# Patient Record
Sex: Male | Born: 1955
Health system: Southern US, Community
[De-identification: ages and names within clinical notes are randomized; demographics above are authoritative.]

## PROBLEM LIST (undated history)

## (undated) DIAGNOSIS — J302 Other seasonal allergic rhinitis: Secondary | ICD-10-CM

## (undated) DIAGNOSIS — T7840XA Allergy, unspecified, initial encounter: Secondary | ICD-10-CM

## (undated) DIAGNOSIS — C801 Malignant (primary) neoplasm, unspecified: Secondary | ICD-10-CM

## (undated) DIAGNOSIS — K219 Gastro-esophageal reflux disease without esophagitis: Secondary | ICD-10-CM

## (undated) DIAGNOSIS — E785 Hyperlipidemia, unspecified: Secondary | ICD-10-CM

## (undated) DIAGNOSIS — C61 Malignant neoplasm of prostate: Secondary | ICD-10-CM

## (undated) DIAGNOSIS — I10 Essential (primary) hypertension: Secondary | ICD-10-CM

## (undated) DIAGNOSIS — G709 Myoneural disorder, unspecified: Secondary | ICD-10-CM

## (undated) DIAGNOSIS — G629 Polyneuropathy, unspecified: Secondary | ICD-10-CM

## (undated) DIAGNOSIS — M1990 Unspecified osteoarthritis, unspecified site: Secondary | ICD-10-CM

## (undated) HISTORY — PX: JOINT REPLACEMENT: SHX530

## (undated) HISTORY — DX: Gastro-esophageal reflux disease without esophagitis: K21.9

## (undated) HISTORY — DX: Unspecified osteoarthritis, unspecified site: M19.90

## (undated) HISTORY — DX: Myoneural disorder, unspecified: G70.9

## (undated) HISTORY — DX: Other seasonal allergic rhinitis: J30.2

## (undated) HISTORY — DX: Malignant (primary) neoplasm, unspecified: C80.1

## (undated) HISTORY — DX: Allergy, unspecified, initial encounter: T78.40XA

## (undated) HISTORY — DX: Essential (primary) hypertension: I10

## (undated) HISTORY — DX: Polyneuropathy, unspecified: G62.9

## (undated) HISTORY — DX: Malignant neoplasm of prostate: C61

## (undated) HISTORY — DX: Hyperlipidemia, unspecified: E78.5

---
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
## 2006-09-02 DIAGNOSIS — C801 Malignant (primary) neoplasm, unspecified: Secondary | ICD-10-CM

## 2006-09-02 HISTORY — DX: Malignant (primary) neoplasm, unspecified: C80.1

## 2006-12-22 ENCOUNTER — Inpatient Hospital Stay (HOSPITAL_COMMUNITY): Admission: RE | Admit: 2006-12-22 | Discharge: 2006-12-23 | Payer: Self-pay | Admitting: Urology

## 2006-12-22 ENCOUNTER — Encounter (INDEPENDENT_AMBULATORY_CARE_PROVIDER_SITE_OTHER): Payer: Self-pay | Admitting: Specialist

## 2007-02-04 ENCOUNTER — Ambulatory Visit (HOSPITAL_COMMUNITY): Admission: RE | Admit: 2007-02-04 | Discharge: 2007-02-04 | Payer: Self-pay | Admitting: Urology

## 2008-09-02 HISTORY — PX: REPLACEMENT TOTAL KNEE: SUR1224

## 2009-06-29 ENCOUNTER — Encounter: Admission: RE | Admit: 2009-06-29 | Discharge: 2009-06-29 | Payer: Self-pay | Admitting: Orthopedic Surgery

## 2009-08-02 HISTORY — PX: JOINT REPLACEMENT: SHX530

## 2009-08-16 ENCOUNTER — Inpatient Hospital Stay (HOSPITAL_COMMUNITY): Admission: RE | Admit: 2009-08-16 | Discharge: 2009-08-18 | Payer: Self-pay | Admitting: Orthopedic Surgery

## 2010-09-02 HISTORY — PX: COLONOSCOPY: SHX174

## 2010-12-03 LAB — CBC
HCT: 39.2 % (ref 39.0–52.0)
MCV: 97.8 fL (ref 78.0–100.0)
Platelets: 199 10*3/uL (ref 150–400)
Platelets: 215 10*3/uL (ref 150–400)
RDW: 11.9 % (ref 11.5–15.5)
WBC: 12.1 10*3/uL — ABNORMAL HIGH (ref 4.0–10.5)

## 2010-12-03 LAB — BASIC METABOLIC PANEL
Chloride: 96 mEq/L (ref 96–112)
Creatinine, Ser: 0.67 mg/dL (ref 0.4–1.5)
GFR calc Af Amer: >60 mL/min (ref >60)
GFR calc non Af Amer: >60 mL/min (ref >60)
Sodium: 132 mEq/L — ABNORMAL LOW (ref 135–145)

## 2010-12-03 LAB — PROTIME-INR
INR: 1.21 (ref 0.00–1.49)
Prothrombin Time: 13.8 seconds (ref 11.6–15.2)
Prothrombin Time: 15.2 seconds (ref 11.6–15.2)

## 2010-12-04 LAB — DIFFERENTIAL
Basophils Relative: 0 % (ref 0–1)
Eosinophils Relative: 2 % (ref 0–5)
Lymphocytes Relative: 23 % (ref 12–46)
Lymphs Abs: 1.5 10*3/uL (ref 0.7–4.0)
Monocytes Absolute: 0.8 10*3/uL (ref 0.1–1.0)
Monocytes Relative: 11 % (ref 3–12)
Neutrophils Relative %: 64 % (ref 43–77)

## 2010-12-04 LAB — COMPREHENSIVE METABOLIC PANEL
ALT: 59 U/L — ABNORMAL HIGH (ref 0–53)
AST: 46 U/L — ABNORMAL HIGH (ref 0–37)
Alkaline Phosphatase: 45 U/L (ref 39–117)
Creatinine, Ser: 0.82 mg/dL (ref 0.4–1.5)
GFR calc Af Amer: >60 mL/min (ref >60)
Glucose, Bld: 120 mg/dL — ABNORMAL HIGH (ref 70–99)
Potassium: 4 mEq/L (ref 3.5–5.1)
Sodium: 137 mEq/L (ref 135–145)
Total Protein: 7 g/dL (ref 6.0–8.3)

## 2010-12-04 LAB — CBC
MCHC: 33.9 g/dL (ref 30.0–36.0)
MCV: 99 fL (ref 78.0–100.0)
Platelets: 223 10*3/uL (ref 150–400)
RDW: 12.4 % (ref 11.5–15.5)

## 2010-12-04 LAB — TYPE AND SCREEN: Antibody Screen: NEGATIVE

## 2010-12-04 LAB — URINALYSIS, ROUTINE W REFLEX MICROSCOPIC
Glucose, UA: NEGATIVE mg/dL
Protein, ur: NEGATIVE mg/dL
Specific Gravity, Urine: 1.024 (ref 1.005–1.030)
Urobilinogen, UA: 0.2 mg/dL (ref 0.0–1.0)

## 2011-01-18 NOTE — Discharge Summary (Signed)
NAME:  Travis Ellis, Travis Ellis NO.:  [id number]   MEDICAL RECORD NO.:  [id number]          PATIENT TYPE:  INP   LOCATION:  1436                         FACILITY:  [facility]   PHYSICIAN:  Heloise Purpura, MD      DATE OF BIRTH:  09/27/1955   DATE OF ADMISSION:  12/22/2006  DATE OF DISCHARGE:                               DISCHARGE SUMMARY   ADMISSION DIAGNOSIS:  Prostate cancer.   DISCHARGE DIAGNOSIS:  Prostate cancer.   PROCEDURES:  1. Robotic-assisted laparoscopic radical prostatectomy.  2. Bilateral pelvic lymphadenectomy.   HISTORY:  For full details, please see admission history and physical.  Briefly, Mr. Eland is a 55 year old gentleman with clinical stage II-B  prostate cancer with a PSA of 3.05 and a Gleason score of 3+3=7.  He did  undergo a CT scan, which demonstrated no evidence of metastatic disease.  After discussing options, he elected to proceed with surgical removal of  his prostate in a robotic-assisted laparoscopic fashion.   HOSPITAL COURSE:  The patient was taken to the operating room on December 22, 2006, and underwent a robotic-assisted laparoscopic radical  prostatectomy and bilateral pelvic lymphadenectomy.  He tolerated the  procedure well and without complications.  Postoperatively, he was able  to be transferred to a regular hospital room following recovery from  anesthesia.  He was able to begin ambulating the night of surgery and by  postoperative day #1 was ambulating without difficulty.  He was begun on  a clear liquid diet, which he tolerated without trouble.  His hemoglobin  remained stable at 12.9 on postoperative day #1.  He maintained  excellent urine output from his Foley catheter with minimal output from  his pelvic drain.  His pelvic drain was therefore removed.  By the  afternoon of postoperative day #1, he had met all discharge criteria and  was able to be discharged home in excellent condition.   DISPOSITION:  Home.   DISCHARGE  MEDICATIONS:  The patient was instructed to resume his regular  home medications excepting any aspirin, nonsteroidal anti-inflammatory  drugs, or herbal supplements.  Specifically, he was told that he may  resume his Prilosec, triamterine/hydrochlorothiazide, simvastatin,  Norvasc, and Nasacort.  He was also given a prescription to take  Darvocet as needed for pain, Colace as a stool softener.  He was also  given Cipro to take beginning one day prior to his return visit.   DISCHARGE INSTRUCTIONS:  The patient was instructed to be ambulatory but  specifically told to refrain from any heavy lifting, strenuous activity  or driving.  He was instructed on routine Foley catheter care.  He was  also told to gradually advance his diet once passing flatus.   FOLLOW-UP:  The patient will follow up in one week for removal of the  Foley catheter and to discuss his surgical pathology in detail.           ______________________________  Heloise Purpura, MD  Electronically Signed     LB/MEDQ  D:  12/23/2006  T:  12/23/2006  Job:  [id number]

## 2011-01-18 NOTE — H&P (Signed)
NAME:  JAY, HASKEW NO.:  [id number]   MEDICAL RECORD NO.:  [id number]          PATIENT TYPE:  INP   LOCATION:  X003                         FACILITY:  [facility]   PHYSICIAN:  Heloise Purpura, MD      DATE OF BIRTH:  1956/07/17   DATE OF ADMISSION:  12/22/2006  DATE OF DISCHARGE:                              HISTORY & PHYSICAL   CHIEF COMPLAINT:  Prostate cancer.   HISTORY OF PRESENT ILLNESS:  Mr. Gustafson is a 55 year old gentleman with  clinical stage T2B prostate cancer with a PSA of 3.05 and Gleason score  of  3+4=7.  He was found to have a nodule along the right base and right  mid portion of the prostate and therefore underwent a prostate biopsy on  October 09, 2006.  This demonstrated 3 out of 13 biopsy cores to be  positive; all on the right side.  He has undergone a CT scan that was  negative for metastatic disease.  He was subsequently seen in  consultation and after discussing management options for clinically  localized prostate cancer, he has elected to proceed with surgical  therapy.  We did discuss our intraoperative plan and due to the  patient's significant palpable disease on the right side of the  prostate, he elected to proceed with a unilateral nerve-sparing  procedure.   PAST MEDICAL HISTORY:  1. Hyperlipidemia.  2. Hypertension.  3. Gastroesophageal reflux disease.   PAST SURGICAL HISTORY:  1. 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  2. Denies any abdominal surgeries.   MEDICATIONS:  1. Norvasc.  2. Triamterene hydrochlorothiazide.  3. Lipitor.  4. Nasacort.  5. Niacin.  6. Aspirin.  7. Multivitamin.  8. Fish oil.   ALLERGIES:  NO KNOWN DRUG ALLERGIES.   FAMILY HISTORY:  No history of prostate cancer or GU malignancy.   SOCIAL HISTORY:  The patient is a Naval architect.  He is married.  He does  smoke infrequently.  He also drinks alcohol infrequently.   REVIEW OF SYSTEMS:  Pertinent positives include chronic  back pain.  All  other systems are reviewed and are negative.   PHYSICAL EXAMINATION:  CONSTITUTIONAL:  Alert and oriented,  in no acute  distress.  CARDIOVASCULAR:  Regular rate and rhythm without obvious murmurs.  LUNGS:  Clear bilaterally.  ABDOMEN:  Soft, nontender, nondistended without abdominal masses.  DRE:  There is firmness along the entire right base and right mid  portion of the prostate consistent with a T2B prostate cancer.   IMPRESSION:  Clinically localized prostate cancer.   PLAN:  Mr. Marxen will undergo a robotic-assisted laparoscopic radical  prostatectomy and then be admitted to the hospital for routine  postoperative care.  He will also undergo a bilateral pelvic  lymphadenectomy.           ______________________________  Heloise Purpura, MD  Electronically Signed     LB/MEDQ  D:  12/22/2006  T:  12/22/2006  Job:  [id number]

## 2011-01-18 NOTE — Op Note (Signed)
NAME:  Travis Ellis, Travis Ellis NO.:  [id number]   MEDICAL RECORD NO.:  [id number]          PATIENT TYPE:  INP   LOCATION:  X003                         FACILITY:  [facility]   PHYSICIAN:  Heloise Purpura, MD      DATE OF BIRTH:  01/08/1956   DATE OF PROCEDURE:  12/22/2006  DATE OF DISCHARGE:                               OPERATIVE REPORT   PREOPERATIVE DIAGNOSIS:  Clinically localized adenocarcinoma of  prostate.   POSTOPERATIVE DIAGNOSIS:  Clinically localized adenocarcinoma of  prostate.   PROCEDURES:  1. Robotic assisted laparoscopic radical prostatectomy (left nerve      sparing).  2. Bilateral pelvic lymphadenectomy.   SURGEON:  Dr. Heloise Purpura.   ASSISTANT:  Dr. Garrison Columbus.   ANESTHESIA:  General.   COMPLICATIONS:  None.   ESTIMATED BLOOD LOSS:  250 mL.   INTRAVENOUS FLUIDS:  1900 mL of lactated Ringer's.   SPECIMENS:  1. Prostate and seminal vesicles.  2. Right pelvic lymph nodes.  3. Left pelvic lymph nodes.   DISPOSITION OF A PATHOLOGY SPECIMEN:  To pathology.   DRAINS:  1. A 20-French straight catheter.  2. A #19 Blake pelvic drain.   INDICATION:  Travis Ellis is a 55 year old gentleman with clinical stage  T2b prostate cancer with a PSA of 3.05 and Gleason score of 3 + 4 =  seven.  The patient had minimal voiding symptoms and good erectile  function preoperatively.  He did undergo a CT scan which had  demonstrated no evidence of metastatic disease.  After discussing  management options for clinically localized prostate cancer, the patient  elected to proceed with the above procedures.  Potential risks and  benefits were discussed with the patient and he consented.   DESCRIPTION OF PROCEDURE:  The patient was taken to the operating room  and a general anesthetic was administered.  He was given preoperative  antibiotics, placed in the dorsal lithotomy position, prepped and draped  in the usual sterile fashion.  A preoperative time-out was  performed.  A  Foley catheter was inserted into the bladder.  A site was selected just  to the left of the umbilicus for placement of the camera port.  This was  placed using a standard open Hassan technique.  This allowed entry into  the peritoneal cavity under direct vision and without difficulty.  A 12  mm port was then placed and the pneumoperitoneum was established.  A 0  degree lens was then used to inspect the abdomen and there was no  evidence of any intra-abdominal injuries or other abnormalities.  Attention then turned to placement of the remaining ports.  Bilateral 8  mm robotic ports were placed lateral and just inferior to the camera  port.  An additional 8 mm robotic port was placed in the far left  lateral abdominal wall.  A 5 mm port was placed between the camera port  and the right robotic port.  An additional 12 mm port was placed in the  far right lateral abdominal wall for laparoscopic assistance.  All ports  were placed under direct vision  and without difficulty.  The surgical  cart was then docked.  With the aid of the cautery scissors, the bladder  was reflected posteriorly allowing entry into the space of Retzius and  identification of the endopelvic fascia and prostate.  The endopelvic  fascia was then incised from the apex back to the base of the prostate  bilaterally.  The underlying levator muscle fibers were swept laterally  off the prostate thereby isolating the dorsal venous complex.  The  dorsal venous complex was then stapled and divided with a 45 mm flex ETS  stapler.  The bladder neck was identified with the aid of Foley catheter  manipulation and entered anteriorly.  A Foley catheter balloon was  deflated and the catheter was brought into the operative field and used  to retract the prostate anteriorly.  The posterior bladder neck was then  divided and the space between the bladder neck and prostate was  developed until the vasa deferentia and seminal  vesicles were  identified.  The vasa deferentia were isolated and divided and lifted  anteriorly.  The seminal vesicles were also dissected free and lifted  anteriorly after controlling the seminal vesicle arterial blood supply.  The space between the anterior rectum and Denonvilliers' fascia was then  bluntly developed thereby isolating the vascular pedicles to the  prostate.  The vascular pedicle on the right side was ligated with Hem-o-  lok clips and excised with a wide non-nerve sparing approach.  On the  left side, the lateral prostatic fascia was incised allowing the  neurovascular bundle to be swept laterally and posteriorly off the  prostate.  The vascular pedicle was then ligated with a Hem-o-lok clip  and divided with sharp cold scissor dissection.  The urethra was then  sharply divided allowing the prostate specimen to be disarticulated.  The pelvis was copiously irrigated.  There was noted to be a small  bleeding vessel off the left pedicle which was able to be clipped with a  Hem-o-lok clip.  There was also some bleeding along the neurovascular  bundle on the left side which was controlled with a figure-of-eight 0  Vicryl suture above the level of the nerve bundle.  Attention then  turned to the right pelvic sidewall.  The fibrofatty tissue between the  external iliac vein, confluence of the iliac vessels, obturator nerve,  and Cooper's ligament was dissected free from the pelvic sidewall with  Hem-o-lok clips used for hemostasis and lymphostasis.  This specimen was  then passed off for permanent pathologic analysis.  An identical  procedure was then performed on the contralateral side.  Attention then  turned to the urethral anastomosis.  A 2-0 Vicryl slip-knot was placed  at the 6 o'clock position between the bladder neck and urethra to  reapproximate these structures.  A double-armed 3-0 Monocryl suture was then used to perform a 360 degree running tension-free  anastomosis  between the bladder neck and urethra.  A 20-French coude catheter was  inserted into the bladder and irrigated.  The anastomosis appeared to be  watertight and there were no blood clots within the bladder.  A #19  Blake drain was then brought through the left robotic port and  appropriately positioned in the pelvis.  It was secured to skin with a  nylon suture.  The surgical cart was then undocked.  A 0 Vicryl stitch  was used to close the right lateral 12 mm port site with the aid of  suture passer device.  The prostate specimen was placed into the  Endopouch retrieval bag and removed via the periumbilical port site  intact.  This fascial opening was then closed with a running 0 Vicryl  suture.  All port sites were then injected with 0.25% Marcaine and  reapproximated the skin level with staples.  Prior port removal, all  port sites were removed under direct vision and hemostasis appeared  excellent.  Sterile dressings were applied.  The patient appeared to  tolerate the procedure well and was able to be transferred to the  recovery unit in satisfactory condition.          ______________________________  Heloise Purpura, MD  Electronically Signed    LB/MEDQ  D:  12/22/2006  T:  12/22/2006  Job:  [id number]

## 2011-05-16 ENCOUNTER — Encounter: Payer: Self-pay | Admitting: Internal Medicine

## 2011-05-27 ENCOUNTER — Encounter: Payer: Self-pay | Admitting: Internal Medicine

## 2011-06-20 ENCOUNTER — Ambulatory Visit (AMBULATORY_SURGERY_CENTER): Payer: BC Managed Care – PPO | Admitting: *Deleted

## 2011-06-20 VITALS — Ht 73.0 in | Wt 225.0 lb

## 2011-06-20 DIAGNOSIS — Z1211 Encounter for screening for malignant neoplasm of colon: Secondary | ICD-10-CM

## 2011-06-20 MED ORDER — PEG-KCL-NACL-NASULF-NA ASC-C 100 G PO SOLR: ORAL | Status: DC

## 2011-07-05 ENCOUNTER — Encounter: Payer: Self-pay | Admitting: Internal Medicine

## 2011-07-05 ENCOUNTER — Ambulatory Visit (AMBULATORY_SURGERY_CENTER): Payer: BC Managed Care – PPO | Admitting: Internal Medicine

## 2011-07-05 VITALS — BP 119/83 | HR 71 | Temp 97.6°F | Resp 20 | Ht 73.0 in | Wt 225.0 lb

## 2011-07-05 DIAGNOSIS — Z1211 Encounter for screening for malignant neoplasm of colon: Secondary | ICD-10-CM

## 2011-07-05 DIAGNOSIS — D126 Benign neoplasm of colon, unspecified: Secondary | ICD-10-CM

## 2011-07-05 MED ORDER — SODIUM CHLORIDE 0.9 % IV SOLN: 500.0000 mL | INTRAVENOUS | Status: DC

## 2011-07-05 NOTE — Patient Instructions (Signed)
Handouts on hemorrhoids, polyps  Discharge instructions per blue and green sheets  Repeat colonoscopy in 10 years. We will mail you a letter to re mind you of this

## 2011-07-08 ENCOUNTER — Telehealth: Payer: Self-pay | Admitting: *Deleted

## 2011-07-08 NOTE — Telephone Encounter (Signed)

## 2012-01-21 ENCOUNTER — Emergency Department (HOSPITAL_COMMUNITY)
Admission: EM | Admit: 2012-01-21 | Discharge: 2012-01-21 | Disposition: A | Discharge status: Home / Self Care | Payer: BC Managed Care – PPO | Attending: Emergency Medicine | Admitting: Emergency Medicine

## 2012-01-21 ENCOUNTER — Encounter (HOSPITAL_COMMUNITY): Payer: Self-pay | Admitting: Emergency Medicine

## 2012-01-21 DIAGNOSIS — E860 Dehydration: Secondary | ICD-10-CM | POA: Insufficient documentation

## 2012-01-21 DIAGNOSIS — Z8546 Personal history of malignant neoplasm of prostate: Secondary | ICD-10-CM | POA: Insufficient documentation

## 2012-01-21 DIAGNOSIS — X30XXXA Exposure to excessive natural heat, initial encounter: Secondary | ICD-10-CM | POA: Insufficient documentation

## 2012-01-21 DIAGNOSIS — T675XXA Heat exhaustion, unspecified, initial encounter: Secondary | ICD-10-CM | POA: Insufficient documentation

## 2012-01-21 DIAGNOSIS — R55 Syncope and collapse: Secondary | ICD-10-CM | POA: Insufficient documentation

## 2012-01-21 DIAGNOSIS — K219 Gastro-esophageal reflux disease without esophagitis: Secondary | ICD-10-CM | POA: Insufficient documentation

## 2012-01-21 DIAGNOSIS — Z79899 Other long term (current) drug therapy: Secondary | ICD-10-CM | POA: Insufficient documentation

## 2012-01-21 DIAGNOSIS — R404 Transient alteration of awareness: Secondary | ICD-10-CM | POA: Insufficient documentation

## 2012-01-21 DIAGNOSIS — I10 Essential (primary) hypertension: Secondary | ICD-10-CM | POA: Insufficient documentation

## 2012-01-21 DIAGNOSIS — M129 Arthropathy, unspecified: Secondary | ICD-10-CM | POA: Insufficient documentation

## 2012-01-21 DIAGNOSIS — E785 Hyperlipidemia, unspecified: Secondary | ICD-10-CM | POA: Insufficient documentation

## 2012-01-21 DIAGNOSIS — Z7982 Long term (current) use of aspirin: Secondary | ICD-10-CM | POA: Insufficient documentation

## 2012-01-21 DIAGNOSIS — R110 Nausea: Secondary | ICD-10-CM | POA: Insufficient documentation

## 2012-01-21 LAB — BASIC METABOLIC PANEL
BUN: 33 mg/dL — ABNORMAL HIGH (ref 6–23)
CO2: 20 mEq/L (ref 19–32)
Calcium: 9.3 mg/dL (ref 8.4–10.5)
Chloride: 101 mEq/L (ref 96–112)
Creatinine, Ser: 2 mg/dL — ABNORMAL HIGH (ref 0.50–1.35)
GFR calc Af Amer: 41 mL/min — ABNORMAL LOW (ref >90)
GFR calc non Af Amer: 35 mL/min — ABNORMAL LOW (ref >90)
Glucose, Bld: 115 mg/dL — ABNORMAL HIGH (ref 70–99)
Potassium: 3.7 mEq/L (ref 3.5–5.1)
Sodium: 137 mEq/L (ref 135–145)

## 2012-01-21 LAB — POCT I-STAT, CHEM 8
BUN: 32 mg/dL — ABNORMAL HIGH (ref 6–23)
Calcium, Ion: 1.17 mmol/L (ref 1.12–1.32)
Chloride: 107 mEq/L (ref 96–112)
Glucose, Bld: 105 mg/dL — ABNORMAL HIGH (ref 70–99)

## 2012-01-21 LAB — CBC
HCT: 48.3 % (ref 39.0–52.0)
Hemoglobin: 17.1 g/dL — ABNORMAL HIGH (ref 13.0–17.0)
MCH: 33.5 pg (ref 26.0–34.0)
MCHC: 35.4 g/dL (ref 30.0–36.0)
MCV: 94.5 fL (ref 78.0–100.0)
RDW: 11.8 % (ref 11.5–15.5)

## 2012-01-21 LAB — DIFFERENTIAL
Basophils Absolute: 0 10*3/uL (ref 0.0–0.1)
Basophils Relative: 0 % (ref 0–1)
Eosinophils Absolute: 0 10*3/uL (ref 0.0–0.7)
Eosinophils Relative: 0 % (ref 0–5)
Monocytes Absolute: 1.3 10*3/uL — ABNORMAL HIGH (ref 0.1–1.0)
Monocytes Relative: 7 % (ref 3–12)
Neutro Abs: 16.1 10*3/uL — ABNORMAL HIGH (ref 1.7–7.7)

## 2012-01-21 LAB — URINALYSIS, ROUTINE W REFLEX MICROSCOPIC
Bilirubin Urine: NEGATIVE
Glucose, UA: NEGATIVE mg/dL
Ketones, ur: NEGATIVE mg/dL
Leukocytes, UA: NEGATIVE
Nitrite: NEGATIVE
Protein, ur: 30 mg/dL — AB
Specific Gravity, Urine: 1.01 (ref 1.005–1.030)
Urobilinogen, UA: 0.2 mg/dL (ref 0.0–1.0)
pH: 7 (ref 5.0–8.0)

## 2012-01-21 LAB — CARDIAC PANEL(CRET KIN+CKTOT+MB+TROPI)
CK, MB: 11.5 ng/mL (ref 0.3–4.0)
Relative Index: 7.4 — ABNORMAL HIGH (ref 0.0–2.5)
Total CK: 156 U/L (ref 7–232)
Total CK: 144 U/L (ref 7–232)
Troponin I: <0.3 ng/mL (ref <0.30)
Troponin I: <0.3 ng/mL (ref <0.30)

## 2012-01-21 LAB — URINE MICROSCOPIC-ADD ON

## 2012-01-21 MED ORDER — SODIUM CHLORIDE 0.9 % IV BOLUS (SEPSIS)
1000.0000 mL | Freq: Once | INTRAVENOUS | Status: AC
  Administered 2012-01-21: 1000 mL via INTRAVENOUS

## 2012-01-21 NOTE — ED Provider Notes (Signed)
History     CSN: [id number]  Arrival date & time 01/21/12  4098   First MD Initiated Contact with Patient 01/21/12 1810      Chief Complaint  Patient presents with  . Loss of Consciousness    (Consider location/radiation/quality/duration/timing/severity/associated sxs/prior treatment) Patient is a 56 y.o. male presenting with syncope. The history is provided by the patient and the spouse. No language interpreter was used.  Loss of Consciousness This is a new problem. The current episode started today. The problem has been resolved. Associated symptoms include nausea. Pertinent negatives include no abdominal pain, chest pain, coughing, diaphoresis, fatigue, fever, headaches, vomiting or weakness. The symptoms are aggravated by exertion. He has tried nothing for the symptoms.   56 year old male coming in after a syncopal episode that lasted a few seconds. States that he was used the floor and did not hit his head when he was working at [employer] and [employer]. States that he ate early in the morning around 7 AM and had not eaten or drank anything all day. Feels like he is dehydrated.  Patient takes fosinopril and hydrochlorothiazide for hypertension. Patient was awake and alert the time EMS arrived. States after he awoke from syncope he did feel nauseated but this has resolved presently. Wife at bedside. [facility]. Medical history of hypertension and prostate cancer.  Past Medical History  Diagnosis Date  . Cancer 2008    prostate  . GERD (gastroesophageal reflux disease)   . Hypertension   . Hyperlipidemia   . Arthritis   . Allergy     Past Surgical History  Procedure Date  . Prostatectomy 2008    cancer  . Replacement total knee 2010    right knee    Family History  Problem Relation Age of Onset  . Breast cancer Mother   . Heart disease Father     History  Substance Use Topics  . Smoking status: Former Smoker    Types: Cigars  . Smokeless tobacco:  Never Used  . Alcohol Use: Yes     rarely      Review of Systems  Constitutional: Negative.  Negative for fever, diaphoresis and fatigue.  HENT: Negative.   Eyes: Negative.   Respiratory: Negative.  Negative for cough.   Cardiovascular: Positive for syncope. Negative for chest pain.  Gastrointestinal: Positive for nausea. Negative for vomiting and abdominal pain.  Genitourinary: Negative for hematuria, flank pain, scrotal swelling, difficulty urinating and testicular pain.  Neurological: Negative.  Negative for weakness and headaches.  Psychiatric/Behavioral: Negative.   All other systems reviewed and are negative.    Allergies  Review of patient's allergies indicates no known allergies.  Home Medications   Current Outpatient Rx  Name Route Sig Dispense Refill  . ACETAMINOPHEN 500 MG PO TABS Oral Take 1,000 mg by mouth at bedtime.     . ASPIRIN 325 MG PO TABS Oral Take 325 mg by mouth daily.      . B COMPLEX-C PO TABS Oral Take 1 tablet by mouth daily.      Marland Kitchen CETIRIZINE HCL 10 MG PO TABS Oral Take 10 mg by mouth daily.      Marland Kitchen VITAMIN D 2000 UNITS PO TABS Oral Take 2,000 Units by mouth daily.      Marland Kitchen KRILL OIL 300 MG PO CAPS Oral Take 1 capsule by mouth daily.     Marland Kitchen LISINOPRIL-HYDROCHLOROTHIAZIDE 10-12.5 MG PO TABS Oral Take 1 tablet by mouth Daily.    Marland Kitchen  MELOXICAM 15 MG PO TABS Oral Take 1 tablet by mouth Daily.    . MULTI-VITAMIN/MINERALS PO TABS Oral Take 1 tablet by mouth daily.      Marland Kitchen NIACIN 100 MG PO TABS Oral Take 100 mg by mouth daily with breakfast.      . OMEPRAZOLE 20 MG PO CPDR Oral Take 20 mg by mouth See admin instructions. Takes 1 capsule every 3rd day    . SIMVASTATIN 20 MG PO TABS Oral Take 1 tablet by mouth Daily.      BP 115/62  Pulse 85  Resp 17  SpO2 94%  Physical Exam  Nursing note and vitals reviewed. Constitutional: He is oriented to person, place, and time. He appears well-developed and well-nourished. No distress.  HENT:  Head: Normocephalic.    Eyes: Conjunctivae and EOM are normal. Pupils are equal, round, and reactive to light.  Neck: Normal range of motion. Neck supple.  Cardiovascular: Normal rate.   Pulmonary/Chest: Effort normal and breath sounds normal. No respiratory distress. He has no wheezes.  Abdominal: Soft. He exhibits no distension. There is tenderness. There is no rebound and no guarding.  Musculoskeletal: Normal range of motion. He exhibits no edema and no tenderness.  Neurological: He is alert and oriented to person, place, and time. He has normal strength. No cranial nerve deficit or sensory deficit. He displays a negative Romberg sign. GCS eye subscore is 4. GCS verbal subscore is 5. GCS motor subscore is 6.  Skin: Skin is warm and dry. He is not diaphoretic.  Psychiatric: He has a normal mood and affect.    ED Course  Procedures (including critical care time)  Labs Reviewed  CBC - Abnormal; Notable for the following:    WBC 18.5 (*)    Hemoglobin 17.1 (*)    All other components within normal limits  DIFFERENTIAL - Abnormal; Notable for the following:    Neutrophils Relative 88 (*)    Neutro Abs 16.1 (*)    Lymphocytes Relative 6 (*)    Monocytes Absolute 1.3 (*)    All other components within normal limits  BASIC METABOLIC PANEL - Abnormal; Notable for the following:    Glucose, Bld 115 (*)    BUN 33 (*)    Creatinine, Ser 2.00 (*)    GFR calc non Af Amer 35 (*)    GFR calc Af Amer 41 (*)    All other components within normal limits  URINALYSIS, ROUTINE W REFLEX MICROSCOPIC - Abnormal; Notable for the following:    APPearance CLOUDY (*)    Hgb urine dipstick TRACE (*)    Protein, ur 30 (*)    All other components within normal limits  CARDIAC PANEL(CRET KIN+CKTOT+MB+TROPI) - Abnormal; Notable for the following:    CK, MB 11.5 (*)    Relative Index 7.4 (*)    All other components within normal limits  URINE MICROSCOPIC-ADD ON - Abnormal; Notable for the following:    Casts GRANULAR CAST (*)     All other components within normal limits  POCT I-STAT, CHEM 8 - Abnormal; Notable for the following:    BUN 32 (*)    Creatinine, Ser 1.70 (*)    Glucose, Bld 105 (*)    All other components within normal limits  CARDIAC PANEL(CRET KIN+CKTOT+MB+TROPI)   No results found.   No diagnosis found.    MDM  Syncopal episode probably due to dehydration and not eating.  Feels better with  BUN and creatinine better improvment after  hydration.  WBC elevated 18.5.  No source  Of infection identified.  Wants to go home.  Will follow up with pcp tomorrow and have labs rechecked.  Return if worse.         Remi Haggard, NP 01/23/12 1121

## 2012-01-21 NOTE — Discharge Instructions (Signed)
Mr Vonbehren your wbc, ckmb and renal labs were abnormal but not dangerously abnormal in the ER today.  Have them rechecked in the next day or two.  We gave you 3 bags of fluid today.  Return to the ER for severe dizziness, chest pain or other concerns.  Drink plenty of fluids tonight before bed.  Rest tomorrow.  Call for an appointment tomorrow.  Hold the bp pill( lisinipril hydrochlorothizide) tomorrow.  It has a diuretic in it.  Discuss with your doctor when to restart the medication.   Dehydration, Adult Dehydration means your body does not have as much fluid as it needs. Your kidneys, brain, and heart will not work properly without the right amount of fluids and salt.  HOME CARE  Ask your doctor how to replace body fluid losses (rehydrate).   Drink enough fluids to keep your pee (urine) clear or pale yellow.   Drink small amounts of fluids often if you feel sick to your stomach (nauseous) or throw up (vomit).   Eat like you normally do.   Avoid:   Foods or drinks high in sugar.   Bubbly (carbonated) drinks.   Juice.   Very hot or cold fluids.   Drinks with caffeine.   Fatty, greasy foods.   Alcohol.   Tobacco.   Eating too much.   Gelatin desserts.   Wash your hands to avoid spreading germs (bacteria, viruses).   Only take medicine as told by your doctor.   Keep all doctor visits as told.  GET HELP RIGHT AWAY IF:   You cannot drink something without throwing up.   You get worse even with treatment.   Your vomit has blood in it or looks greenish.   Your poop (stool) has blood in it or looks black and tarry.   You have not peed in 6 to 8 hours.   You pee a small amount of very dark pee.   You have a fever.   You pass out (faint).   You have belly (abdominal) pain that gets worse or stays in one spot (localizes).   You have a rash, stiff neck, or bad headache.   You get easily annoyed, sleepy, or are hard to wake up.   You feel weak, dizzy, or very  thirsty.  MAKE SURE YOU:   Understand these instructions.   Will watch your condition.   Will get help right away if you are not doing well or get worse.  Document Released: 06/15/2009 Document Revised: 08/08/2011 Document Reviewed: 04/08/2011 [facility] Patient Information 2012 [place], [place].

## 2012-01-21 NOTE — ED Notes (Signed)
ZOX:[id number]<AV> Expected date:01/21/12<BR> Expected time:<BR> Means of arrival:<BR> Comments:<BR> EMS 65 GC - syncope/hypotensive

## 2012-01-21 NOTE — ED Notes (Signed)
Pt. given urinal and advised need urine sample 

## 2012-01-21 NOTE — ED Notes (Signed)
Per EMS.  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  Pt had a sycopal episode from a standing position.  Pt was able to lower himself before completely fainting.  Pt was out for a few seconds per bystander.  Pt awake upon EMS arrival.  Pt felt faint when he tried to stand.

## 2012-01-22 NOTE — ED Provider Notes (Signed)
Medical screening examination/treatment/procedure(s) were performed by non-physician practitioner and as supervising physician I was immediately available for consultation/collaboration.   Dx: Dehydration and Heat Illness  Flint Melter, MD 01/22/12 [phone]

## 2012-01-24 ENCOUNTER — Ambulatory Visit (INDEPENDENT_AMBULATORY_CARE_PROVIDER_SITE_OTHER): Payer: BC Managed Care – PPO | Admitting: Family Medicine

## 2012-01-24 VITALS — BP 112/78 | HR 72 | Temp 97.7°F | Resp 18 | Ht 72.25 in | Wt 221.0 lb

## 2012-01-24 DIAGNOSIS — E785 Hyperlipidemia, unspecified: Secondary | ICD-10-CM

## 2012-01-24 DIAGNOSIS — R55 Syncope and collapse: Secondary | ICD-10-CM

## 2012-01-24 DIAGNOSIS — E860 Dehydration: Secondary | ICD-10-CM

## 2012-01-24 DIAGNOSIS — I10 Essential (primary) hypertension: Secondary | ICD-10-CM

## 2012-01-24 LAB — POCT CBC
Granulocyte percent: 58.4 %G (ref 37–80)
MID (cbc): 0.6 (ref 0–0.9)
MPV: 9.2 fL (ref 0–99.8)
POC MID %: 9 %M (ref 0–12)
Platelet Count, POC: 253 10*3/uL (ref 142–424)
RBC: 4.69 M/uL (ref 4.69–6.13)

## 2012-01-24 LAB — COMPREHENSIVE METABOLIC PANEL
AST: 23 U/L (ref 0–37)
Albumin: 4 g/dL (ref 3.5–5.2)
Alkaline Phosphatase: 39 U/L (ref 39–117)
Potassium: 4.6 mEq/L (ref 3.5–5.3)
Sodium: 141 mEq/L (ref 135–145)
Total Protein: 6.7 g/dL (ref 6.0–8.3)

## 2012-01-24 LAB — TSH: TSH: 1.185 u[IU]/mL (ref 0.350–4.500)

## 2012-01-24 LAB — LIPID PANEL
HDL: 35 mg/dL — ABNORMAL LOW (ref >39)
LDL Cholesterol: 68 mg/dL (ref 0–99)

## 2012-01-24 NOTE — Progress Notes (Signed)
Patient Name: Travis Ellis Date of Birth: 08-Aug-1956 Medical Record Number: [id number] Gender: male Date of Encounter: 01/24/2012  History of Present Illness:  Travis Ellis is a 56 y.o. very pleasant male patient who presents with the following:  He recenty became dehydrated and passed out while helping his church with a move on 5/21.  He was rehydrated in the ED, but told to have a recheck at his PCP due to elevated creatinine and WBC count.  He is now feeling well.  He is able to eat again without any problems- however he is fasting currently for labs.  He plans to have a CPE in the next couple of weeks and wonders if all his labs can be done now.  He is followed by urology for PSA so this is not needed.   No further syncope.  Feeling back to normal now  There is no problem list on file for this patient.  Past Medical History  Diagnosis Date  . Cancer 2008    prostate  . GERD (gastroesophageal reflux disease)   . Hypertension   . Hyperlipidemia   . Arthritis   . Allergy    Past Surgical History  Procedure Date  . Prostatectomy 2008    cancer  . Replacement total knee 2010    right knee   History  Substance Use Topics  . Smoking status: Former Smoker    Types: Cigars  . Smokeless tobacco: Never Used  . Alcohol Use: Yes     rarely   Family History  Problem Relation Age of Onset  . Breast cancer Mother   . Heart disease Father    No Known Allergies  Medication list has been reviewed and updated.  Review of Systems: As per HPI- otherwise negative.   Physical Examination: Filed Vitals:   01/24/12 0753  BP: 112/78  Pulse: 72  Temp: 97.7 F (36.5 C)  TempSrc: Oral  Resp: 18  Height: 6' 0.25" (1.835 m)  Weight: 221 lb (100.245 kg)  SpO2: 97%    Body mass index is 29.77 kg/(m^2).  GEN: WDWN, NAD, Non-toxic, A & O x 3 HEENT: Atraumatic, Normocephalic. Neck supple. No masses, No LAD.  Tm and oropharynx wnl Ears and Nose: No external deformity. CV:  RRR, No M/G/R. No JVD. No thrill. No extra heart sounds. PULM: CTA B, no wheezes, crackles, rhonchi. No retractions. No resp. distress. No accessory muscle use. ABD: S, NT, ND, +BS. No rebound. No HSM. EXTR: No c/c/e NEURO Normal gait.  PSYCH: Normally interactive. Conversant. Not depressed or anxious appearing.  Calm demeanor.   Results for orders placed in visit on 01/24/12  POCT CBC      Component Value Range   WBC 6.3  4.6 - 10.2 (K/uL)   Lymph, poc 2.1  0.6 - 3.4    POC LYMPH PERCENT 32.6  10 - 50 (%L)   MID (cbc) 0.6  0 - 0.9    POC MID % 9.0  0 - 12 (%M)   POC Granulocyte 3.7  2 - 6.9    Granulocyte percent 58.4  37 - 80 (%G)   RBC 4.69  4.69 - 6.13 (M/uL)   Hemoglobin 15.6  14.1 - 18.1 (g/dL)   HCT, POC 40.9  81.1 - 53.7 (%)   MCV 98.3 (*) 80 - 97 (fL)   MCH, POC 33.3 (*) 27 - 31.2 (pg)   MCHC 33.8  31.8 - 35.4 (g/dL)   RDW, POC 12.4  Platelet Count, POC 253  142 - 424 (K/uL)   MPV 9.2  0 - 99.8 (fL)    Assessment and Plan: 1. Hyperlipidemia  Comprehensive metabolic panel, Lipid panel  2. Hypertension  POCT CBC  3. Syncope  TSH  4. Dehydration     Recovered from dehydration and syncopal event.  Will check labs as above- Will plan further follow- up pending labs. He will let us know if any problems in the meantime

## 2012-01-24 NOTE — ED Provider Notes (Signed)
Medical screening examination/treatment/procedure(s) were performed by non-physician practitioner and as supervising physician I was immediately available for consultation/collaboration.  Flint Melter, MD 01/24/12 [phone]

## 2012-02-03 ENCOUNTER — Encounter: Payer: Self-pay | Admitting: Internal Medicine

## 2012-02-03 ENCOUNTER — Ambulatory Visit (INDEPENDENT_AMBULATORY_CARE_PROVIDER_SITE_OTHER): Payer: BC Managed Care – PPO | Admitting: Internal Medicine

## 2012-02-03 VITALS — BP 136/83 | HR 97 | Temp 97.8°F | Resp 16 | Ht 72.0 in | Wt 221.4 lb

## 2012-02-03 DIAGNOSIS — E785 Hyperlipidemia, unspecified: Secondary | ICD-10-CM | POA: Insufficient documentation

## 2012-02-03 DIAGNOSIS — G629 Polyneuropathy, unspecified: Secondary | ICD-10-CM | POA: Insufficient documentation

## 2012-02-03 DIAGNOSIS — Z0000 Encounter for general adult medical examination without abnormal findings: Secondary | ICD-10-CM

## 2012-02-03 DIAGNOSIS — Z79899 Other long term (current) drug therapy: Secondary | ICD-10-CM

## 2012-02-03 DIAGNOSIS — I10 Essential (primary) hypertension: Secondary | ICD-10-CM

## 2012-02-03 DIAGNOSIS — E782 Mixed hyperlipidemia: Secondary | ICD-10-CM

## 2012-02-03 DIAGNOSIS — G5790 Unspecified mononeuropathy of unspecified lower limb: Secondary | ICD-10-CM

## 2012-02-03 MED ORDER — SIMVASTATIN 20 MG PO TABS: 20.0000 mg | ORAL_TABLET | Freq: Every day | ORAL | Status: DC

## 2012-02-03 MED ORDER — LISINOPRIL-HYDROCHLOROTHIAZIDE 10-12.5 MG PO TABS: 1.0000 | ORAL_TABLET | Freq: Every day | ORAL | Status: DC

## 2012-02-03 NOTE — Progress Notes (Signed)
  Subjective:    Patient ID: Travis Ellis, male    DOB: Nov 27, 1955, 56 y.o.   MRN: [id number]  HPI Htn, dyslipidemia, meds all doing well. Here for cpe. Had er visit for dehydration with full cardiac w/up. Neuropathy n o worse. See scanned hx form  Review of Systems    see scanned ros Objective:   Physical Exam  Constitutional: He is oriented to person, place, and time. He appears well-developed and well-nourished. No distress.  HENT:  Right Ear: External ear normal.  Left Ear: External ear normal.  Nose: Nose normal.  Mouth/Throat: Oropharynx is clear and moist.  Neck: Normal range of motion. Neck supple. No thyromegaly present.  Cardiovascular: Normal rate, regular rhythm and normal heart sounds.   Pulmonary/Chest: Effort normal and breath sounds normal. No respiratory distress.  Abdominal: Soft. Bowel sounds are normal. He exhibits no mass.  Genitourinary: Rectum normal and penis normal.  Lymphadenopathy:    He has no cervical adenopathy.  Neurological: He is alert and oriented to person, place, and time. He has normal reflexes. He exhibits abnormal muscle tone. Coordination abnormal.  Skin: Skin is warm and dry.  Psychiatric: He has a normal mood and affect. His behavior is normal. Judgment and thought content normal.    Bp 120/80 pulse 80      Assessment & Plan:  HTN and lipids controlled Bilateral leg neuropathy

## 2012-03-22 ENCOUNTER — Ambulatory Visit (INDEPENDENT_AMBULATORY_CARE_PROVIDER_SITE_OTHER): Payer: BC Managed Care – PPO | Admitting: Internal Medicine

## 2012-03-22 VITALS — BP 142/80 | HR 96 | Temp 98.6°F | Resp 17 | Ht 72.0 in | Wt 222.0 lb

## 2012-03-22 DIAGNOSIS — J40 Bronchitis, not specified as acute or chronic: Secondary | ICD-10-CM

## 2012-03-22 DIAGNOSIS — R05 Cough: Secondary | ICD-10-CM

## 2012-03-22 MED ORDER — AZITHROMYCIN 500 MG PO TABS: 500.0000 mg | ORAL_TABLET | Freq: Every day | ORAL | Status: AC

## 2012-03-22 MED ORDER — HYDROCODONE-ACETAMINOPHEN 7.5-500 MG/15ML PO SOLN: 5.0000 mL | Freq: Four times a day (QID) | ORAL | Status: AC | PRN

## 2012-03-22 NOTE — Patient Instructions (Addendum)

## 2012-03-22 NOTE — Progress Notes (Signed)
  Subjective:    Patient ID: Travis Ellis, male    DOB: 1956/05/07, 56 y.o.   MRN: [id number]  HPI Cough, sweats, fatigue. Wife has proven bronchitis. No cp, sob.   Review of Systems     Objective:   Physical Exam Throat red , tender Lungs few rhonchi       Assessment & Plan:  Bronchitis

## 2012-05-21 ENCOUNTER — Encounter: Payer: Self-pay | Admitting: Internal Medicine

## 2012-05-21 ENCOUNTER — Ambulatory Visit (INDEPENDENT_AMBULATORY_CARE_PROVIDER_SITE_OTHER): Payer: BC Managed Care – PPO | Admitting: Internal Medicine

## 2012-05-21 VITALS — BP 120/86 | HR 101 | Temp 98.4°F | Resp 16 | Ht 73.0 in | Wt 224.8 lb

## 2012-05-21 DIAGNOSIS — S1096XA Insect bite of unspecified part of neck, initial encounter: Secondary | ICD-10-CM

## 2012-05-21 DIAGNOSIS — R269 Unspecified abnormalities of gait and mobility: Secondary | ICD-10-CM | POA: Insufficient documentation

## 2012-05-21 DIAGNOSIS — M21379 Foot drop, unspecified foot: Secondary | ICD-10-CM

## 2012-05-21 DIAGNOSIS — L0201 Cutaneous abscess of face: Secondary | ICD-10-CM

## 2012-05-21 NOTE — Progress Notes (Signed)
  Subjective:    Patient ID: Travis Ellis, male    DOB: 24-Nov-1955, 56 y.o.   MRN: [id number]  HPI Has right foot neuropathy for years. 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 Neurologists dxed sciatica caused neuropathy. Problem is not progressive.   Review of Systems     Objective:   Physical Exam Right foot drop--stable       Assessment & Plan:  DOT cleared 1 yr

## 2012-05-21 NOTE — Patient Instructions (Signed)
Drop Foot

## 2012-09-14 ENCOUNTER — Ambulatory Visit (INDEPENDENT_AMBULATORY_CARE_PROVIDER_SITE_OTHER): Payer: BC Managed Care – PPO | Admitting: Internal Medicine

## 2012-09-14 ENCOUNTER — Encounter: Payer: Self-pay | Admitting: Internal Medicine

## 2012-09-14 VITALS — BP 118/76 | HR 87 | Temp 98.0°F | Resp 16 | Ht 72.0 in | Wt 223.8 lb

## 2012-09-14 DIAGNOSIS — I10 Essential (primary) hypertension: Secondary | ICD-10-CM

## 2012-09-14 DIAGNOSIS — Z5181 Encounter for therapeutic drug level monitoring: Secondary | ICD-10-CM

## 2012-09-14 DIAGNOSIS — E785 Hyperlipidemia, unspecified: Secondary | ICD-10-CM

## 2012-09-14 DIAGNOSIS — G5790 Unspecified mononeuropathy of unspecified lower limb: Secondary | ICD-10-CM

## 2012-09-14 DIAGNOSIS — C61 Malignant neoplasm of prostate: Secondary | ICD-10-CM | POA: Insufficient documentation

## 2012-09-14 LAB — COMPREHENSIVE METABOLIC PANEL
ALT: 38 U/L (ref 0–53)
CO2: 28 mEq/L (ref 19–32)
Sodium: 138 mEq/L (ref 135–145)
Total Bilirubin: 0.7 mg/dL (ref 0.3–1.2)
Total Protein: 7 g/dL (ref 6.0–8.3)

## 2012-09-14 NOTE — Patient Instructions (Addendum)
Nonsteroidal Anti-Inflammatory Medications  Nonsteroidal anti-inflammatory medications (NSAIDs) are a group of medicines often used for relief of pain and inflammation. These drugs include ibuprofen, aspirin, and naproxen. They are widely available in an over-the-counter form. The mechanism by which these drugs work in the body is not clearly understood. NSAIDs have many effects on the body, including pain relief, anti-inflammation, fever reduction, and reducing the blood's ability to clot. Most NSAIDs are taken orally in tablet form. Some may also be taken by injection in a vein (intravenously). WHY ATHLETES USE IT Many athletes use NSAIDs for their anti-inflammatory and pain reducing (analgesic) properties. Athletic participation frequently causes aches, pains, and inflammation, which these drugs can treat. There is also some evidence that they speed recovery after injury.  ADVERSE EFFECTS   Nausea.  Stomach pain.  Bleeding from the stomach and intestines.  Inflammation of the kidneys (nephritis).  Inflammation of the liver (hepatitis).  Headache.  Ringing in the ears (tinnitus).  Rash, with sun exposure (photosensitivity).  Increase in fluid volume (fluid retention).  Ulcers of the stomach and small intestine.  Kidney failure.  Liver failure.  Poor control of asthma.  Itching (urticaria).  Increase in nasal polyps (swelling).  Depression.  Loss of red blood cells (anemia).  Loose stools (diarrhea). PHARMACOLOGY  NSAIDs exist in both short-acting and long-acting forms. Many users of NSAIDs experience pain relief with initial doses, that becomes less effective with continual use. Most caregivers believe NSAIDs should be used for 2 to 3 weeks, before they are considered ineffective. Most NSAIDs are excreted from the body through the kidneys. The use of NSAIDs under conditions where dehydration can occur increases the risk of side effects to the kidneys and the liver. This is  especially common in older athletes and in athletes not acclimated to the heat. All these drugs are well absorbed when taken by mouth. The price of these drugs is variable. PREVENTION  It is recommend that NSAIDs be used after athletic participation to help recovery, or in the early stages of injury treatment. This is the time when athletes are trying to control pain and inflammation. Many athletes choose to take NSAIDs prophylactically (preventative, before injury). However, this may be associated with an increased risk of side effects. If you experience any side effects, including a decrease in performance while taking NSAIDs, discontinue use and consult your caregiver. If you choose to use NSAIDs regularly. for longer than 3 to 6 months, you should obtain screening blood tests for the liver, kidney, and bone marrow. Ongoing use may also increase your risk of a stomach ulcer. Document Released: 08/19/2005 Document Revised: 11/11/2011 Document Reviewed: 12/01/2008 [facility] Patient Information 2013 [place], [place].

## 2012-09-14 NOTE — Progress Notes (Signed)
  Subjective:    Patient ID: Travis Ellis, male    DOB: [birth year], 57 y.o.   MRN: [id number]  HPI HTN and lipids are stable Work going well. Has early cough and congestion. Prostate cancer followed closely by Dr. Laverle Patter, had slite rise in super sensitive psa.  Review of Systems stable    Objective:   Physical Exam  Constitutional: He is oriented to person, place, and time. He appears well-developed and well-nourished. No distress.  HENT:  Right Ear: External ear normal.  Left Ear: External ear normal.  Nose: Nose normal.  Mouth/Throat: Oropharynx is clear and moist.  Eyes: EOM are normal.  Cardiovascular: Normal rate, regular rhythm, normal heart sounds and intact distal pulses.   Pulmonary/Chest: Effort normal and breath sounds normal.  Musculoskeletal: Normal range of motion.  Neurological: He is alert and oriented to person, place, and time. He exhibits abnormal muscle tone. Gait abnormal. Coordination normal.  Psychiatric: He has a normal mood and affect. His behavior is normal.      Cmet    Assessment & Plan:  RF meds 1 yr

## 2012-09-15 ENCOUNTER — Encounter: Payer: Self-pay | Admitting: Radiology

## 2012-09-17 ENCOUNTER — Ambulatory Visit (INDEPENDENT_AMBULATORY_CARE_PROVIDER_SITE_OTHER): Payer: BC Managed Care – PPO | Admitting: Family Medicine

## 2012-09-17 VITALS — BP 143/78 | HR 86 | Temp 98.7°F | Resp 16 | Ht 73.5 in | Wt 223.4 lb

## 2012-09-17 DIAGNOSIS — J040 Acute laryngitis: Secondary | ICD-10-CM

## 2012-09-17 DIAGNOSIS — J029 Acute pharyngitis, unspecified: Secondary | ICD-10-CM

## 2012-09-17 DIAGNOSIS — J069 Acute upper respiratory infection, unspecified: Secondary | ICD-10-CM

## 2012-09-17 MED ORDER — AZITHROMYCIN 250 MG PO TABS: ORAL_TABLET | ORAL | Status: DC

## 2012-09-17 NOTE — Progress Notes (Signed)
Subjective: 57 year old man with respiratory tract infection that began last weekend. He's been worse, and had a little sore throat. He is cough. Ears are okay. Sinuses and optic congested he has postnasal drainage. He has not blowing a lot out of his nose. He has not been febrile. He did get a flu shot this year. He has had some body aches. 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 Has been taking OTC cough syrup. This is similar to what he has had in past years, and he is found that after a point it is better to go and get treated rather than wait much longer to get worse.  Objective: Healthy-appearing man in no major distress. His voice is raspy. TMs are normal. Throat has a little edema of the uvula and mild erythema but nothing very remarkable. Neck was supple without nodes. Chest is clear to auscultation. Heart regular.  Assessment: URI with postnasal drainage and laryngitis  Plan:

## 2012-09-17 NOTE — Patient Instructions (Signed)

## 2013-02-08 ENCOUNTER — Other Ambulatory Visit: Payer: Self-pay | Admitting: Internal Medicine

## 2013-03-22 ENCOUNTER — Encounter: Payer: Self-pay | Admitting: Internal Medicine

## 2013-03-22 ENCOUNTER — Ambulatory Visit: Payer: BC Managed Care – PPO

## 2013-03-22 ENCOUNTER — Ambulatory Visit (INDEPENDENT_AMBULATORY_CARE_PROVIDER_SITE_OTHER): Payer: BC Managed Care – PPO | Admitting: Internal Medicine

## 2013-03-22 VITALS — BP 134/86 | HR 91 | Temp 98.0°F | Resp 16 | Ht 72.0 in | Wt 226.4 lb

## 2013-03-22 DIAGNOSIS — Z0000 Encounter for general adult medical examination without abnormal findings: Secondary | ICD-10-CM

## 2013-03-22 DIAGNOSIS — E785 Hyperlipidemia, unspecified: Secondary | ICD-10-CM

## 2013-03-22 DIAGNOSIS — C61 Malignant neoplasm of prostate: Secondary | ICD-10-CM

## 2013-03-22 DIAGNOSIS — G609 Hereditary and idiopathic neuropathy, unspecified: Secondary | ICD-10-CM

## 2013-03-22 DIAGNOSIS — I10 Essential (primary) hypertension: Secondary | ICD-10-CM

## 2013-03-22 LAB — TSH: TSH: 0.957 u[IU]/mL (ref 0.350–4.500)

## 2013-03-22 LAB — LIPID PANEL
Cholesterol: 136 mg/dL (ref 0–200)
HDL: 35 mg/dL — ABNORMAL LOW (ref >39)
Triglycerides: 146 mg/dL (ref <150)

## 2013-03-22 LAB — GLUCOSE, POCT (MANUAL RESULT ENTRY): POC Glucose: 109 mg/dl — AB (ref 70–99)

## 2013-03-22 LAB — COMPREHENSIVE METABOLIC PANEL
AST: 31 U/L (ref 0–37)
BUN: 22 mg/dL (ref 6–23)
CO2: 24 mEq/L (ref 19–32)
Calcium: 9.8 mg/dL (ref 8.4–10.5)
Chloride: 103 mEq/L (ref 96–112)
Creat: 0.69 mg/dL (ref 0.50–1.35)
Glucose, Bld: 110 mg/dL — ABNORMAL HIGH (ref 70–99)

## 2013-03-22 NOTE — Progress Notes (Signed)
  Subjective:    Patient ID: Travis Ellis, male    DOB: [birth year], 57 y.o.   MRN: [id number]  HPI    Review of Systems  Constitutional: Negative.   HENT: Negative.   Eyes: Negative.   Respiratory: Negative.   Cardiovascular: Negative.   Gastrointestinal: Negative.   Endocrine: Negative.   Genitourinary: Negative.   Musculoskeletal: Positive for gait problem.  Skin: Negative.   Allergic/Immunologic: Negative.   Neurological: Negative.   Hematological: Negative.   Psychiatric/Behavioral: Negative.        Objective:   Physical Exam        Assessment & Plan:

## 2013-03-22 NOTE — Patient Instructions (Signed)
Gastrointestinal Bleeding °Gastrointestinal (GI) bleeding means there is bleeding somewhere along the digestive tract, between the mouth and anus. °CAUSES  °There are many different problems that can cause GI bleeding. Possible causes include: °· Esophagitis. This is inflammation, irritation, or swelling of the esophagus. °· Hemorrhoids. These are veins that are full of blood (engorged) in the rectum. They cause pain, inflammation, and may bleed. °· Anal fissures. These are areas of painful tearing which may bleed. They are often caused by passing hard stool. °· Diverticulosis. These are pouches that form on the colon over time, with age, and may bleed significantly. °· Diverticulitis. This is inflammation in areas with diverticulosis. It can cause pain, fever, and bloody stools, although bleeding is rare. °· Polyps and cancer. Colon cancer often starts out as precancerous polyps. °· Gastritis and ulcers. Bleeding from the upper gastrointestinal tract (near the stomach) may travel through the intestines and produce black, sometimes tarry, often bad smelling stools. In certain cases, if the bleeding is fast enough, the stools may not be black, but red. This condition may be life-threatening. °SYMPTOMS  °· Vomiting bright red blood or material that looks like coffee grounds. °· Bloody, black, or tarry stools. °DIAGNOSIS  °Your caregiver may diagnose your condition by taking your history and performing a physical exam. More tests may be needed, including: °· X-rays and other imaging tests. °· Esophagogastroduodenoscopy (EGD). This test uses a flexible, lighted tube to look at your esophagus, stomach, and small intestine. °· Colonoscopy. This test uses a flexible, lighted tube to look at your colon. °TREATMENT  °Treatment depends on the cause of your bleeding.  °· For bleeding from the esophagus, stomach, small intestine, or colon, the caregiver doing your EGD or colonoscopy may be able to stop the bleeding as part of  the procedure. °· Inflammation or infection of the colon can be treated with medicines. °· Many rectal problems can be treated with creams, suppositories, or warm baths. °· Surgery is sometimes needed. °· Blood transfusions are sometimes needed if you have lost a lot of blood. °If bleeding is slow, you may be allowed to go home. If there is a lot of bleeding, you will need to stay in the hospital for observation. °HOME CARE INSTRUCTIONS  °· Take any medicines exactly as prescribed. °· Keep your stools soft by eating foods that are high in fiber. These foods include whole grains, legumes, fruits, and vegetables. Prunes (1 to 3 a day) work well for many people. °· Drink enough fluids to keep your urine clear or pale yellow. °SEEK IMMEDIATE MEDICAL CARE IF:  °· Your bleeding increases. °· You feel lightheaded, weak, or you faint. °· You have severe cramps in your back or abdomen. °· You pass large blood clots in your stool. °· Your problems are getting worse. °MAKE SURE YOU:  °· Understand these instructions. °· Will watch your condition. °· Will get help right away if you are not doing well or get worse. °Document Released: 08/16/2000 Document Revised: 08/05/2012 Document Reviewed: 07/29/2011 °[employer]® Patient Information ©2014 [employer]. ° °

## 2013-03-22 NOTE — Progress Notes (Signed)
  Subjective:    Patient ID: Travis Ellis, male    DOB: [birth year], 57 y.o.   MRN: [id number]  HPI Feels good, doing well for him, only new problem is occ blood in stool. Had colonoscopy about 73yrs with polps. Neuropathy stable, followed closely by neurology [facility]. HTN/lipids controlled.   Review of Systems  HENT: Negative.   Eyes: Negative.   Respiratory: Negative.   Cardiovascular: Negative.   Gastrointestinal: Positive for blood in stool.  Endocrine: Negative.   Genitourinary: Positive for urgency and difficulty urinating.  Musculoskeletal: Positive for gait problem.  Skin: Negative.   Allergic/Immunologic: Negative.   Neurological: Positive for weakness.  Hematological: Negative.   Psychiatric/Behavioral: Negative.        Objective:   Physical Exam  Vitals reviewed. Constitutional: He is oriented to person, place, and time. He appears well-developed and well-nourished. No distress.  HENT:  Right Ear: External ear normal.  Left Ear: External ear normal.  Nose: Nose normal.  Mouth/Throat: Oropharynx is clear and moist.  Eyes: Conjunctivae and EOM are normal. Pupils are equal, round, and reactive to light.  Neck: Normal range of motion. Neck supple. No tracheal deviation present. No thyromegaly present.  Cardiovascular: Normal rate, regular rhythm, normal heart sounds and intact distal pulses.   Pulmonary/Chest: Effort normal and breath sounds normal.  Abdominal: Soft. Bowel sounds are normal.  Genitourinary: Prostate normal and penis normal. Guaiac positive stool.  Musculoskeletal: He exhibits no tenderness.  Lymphadenopathy:    He has no cervical adenopathy.  Neurological: He is alert and oriented to person, place, and time. He has normal reflexes. No cranial nerve deficit. He exhibits normal muscle tone. Coordination normal.  Skin: No rash noted.  Psychiatric: He has a normal mood and affect. His behavior is normal. Judgment and thought content normal.     [facility]  reading (PRIMARY) by  Dr Perrin Maltese elevated right hemidiaphragm, normal otherwise Results for orders placed in visit on 03/22/13  GLUCOSE, POCT (MANUAL RESULT ENTRY)      Result Value Range   POC Glucose 109 (*) 70 - 99 mg/dl  POCT GLYCOSYLATED HEMOGLOBIN (HGB A1C)      Result Value Range   Hemoglobin A1C 5.4    IFOBT (OCCULT BLOOD)      Result Value Range   IFOBT Positive            Assessment & Plan:  Blood on hemosure/See GI RF meds 57yr

## 2013-08-03 ENCOUNTER — Encounter: Payer: Self-pay | Admitting: Radiation Oncology

## 2013-08-03 NOTE — Progress Notes (Signed)
GU Location of Tumor / Histology: biochemical recurrence of prostate cancer  If Prostate Cancer, Gleason Score is (3 + 4) and PSA is (3.05 pretreatment)  Patient had radical prostatectomy on December 22, 2006.  Biopsies of prostate (if applicable) revealed:     Past/Anticipated interventions by urology, if any: referral to Dr. Kathrynn Running  Past/Anticipated interventions by medical oncology, if any: None  Weight changes, if any: None noted  Bowel/Bladder complaints, if any: no specific complaints, erectile dysfunction (good response to levitra), excellent continence   Nausea/Vomiting, if any: None noted  Pain issues, if any:  None noted  SAFETY ISSUES:  Prior radiation? NO  Pacemaker/ICD? NO  Possible current pregnancy? NO  Is the patient on methotrexate? NO  Current Complaints / other details:  57 year old male. 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

## 2013-08-04 ENCOUNTER — Ambulatory Visit
Admission: RE | Admit: 2013-08-04 | Discharge: 2013-08-04 | Disposition: A | Discharge status: Home / Self Care | Payer: BC Managed Care – PPO | Source: Ambulatory Visit | Attending: Radiation Oncology | Admitting: Radiation Oncology

## 2013-08-04 ENCOUNTER — Encounter: Payer: Self-pay | Admitting: Radiation Oncology

## 2013-08-04 VITALS — BP 121/71 | HR 81 | Temp 97.6°F | Resp 16 | Ht 73.0 in | Wt 228.3 lb

## 2013-08-04 DIAGNOSIS — C61 Malignant neoplasm of prostate: Secondary | ICD-10-CM

## 2013-08-04 DIAGNOSIS — Z9079 Acquired absence of other genital organ(s): Secondary | ICD-10-CM | POA: Insufficient documentation

## 2013-08-04 NOTE — Progress Notes (Signed)
IPSS 4. Patient reports that normal he does not get up during the night to void. However, patient does report that if his fluid intake is great before bed he may get up once but, this is rare. Denies burning with urination or hematuria. Reports that he does wear a pad daily but, only dribbles urine rarely during strenuous exercise. Denies difficulty emptying his bladder. Describes a steady stream of urine. Reports erectile dysfunction managed with levitra.

## 2013-08-04 NOTE — Progress Notes (Signed)
See progress note under physician encounter. 

## 2013-08-04 NOTE — Progress Notes (Signed)
Radiation Oncology         (336) [phone] ________________________________  Initial outpatient Consultation  Name: Travis Ellis MRN: [id number]  Date: 08/04/2013  DOB: December 16, 1955  WU:[id number], Loretha Stapler, MD  Crecencio Mc, MD   REFERRING PHYSICIAN: Crecencio Mc, MD  DIAGNOSIS: 57 y.o. gentleman with stage T2a adenocarcinoma of the prostate with a Gleason's score of 3+4 and a pre-prostatectomy PSA of 3.05 and current PSA 6 1/2 year post-prostatectomy of 0.24  HISTORY OF PRESENT ILLNESS::Travis Ellis is a 57 y.o. gentleman.  He underwent prostatectomy on December 22, 2006, with favorable pathology features:  PSA has been detectable and slowly rising to a recent high of 0.24 on 07/07/13.  He has been referred for possible salvage prostatic fossa radiotherapy.  PREVIOUS RADIATION THERAPY: No  PAST MEDICAL HISTORY:  has a past medical history of Cancer (2008); GERD (gastroesophageal reflux disease); Hypertension; Hyperlipidemia; Arthritis; Allergy; Neuromuscular disorder; and Prostate cancer.    PAST SURGICAL HISTORY: Past Surgical History  Procedure Laterality Date  . Replacement total knee  2010    right knee  . Prostatectomy  12/2006    cancer  . Joint replacement Right 08/2009    knee    FAMILY HISTORY: family history includes Breast cancer in his mother; COPD in his father; Cancer in his mother; Heart disease in his father and paternal grandfather.  SOCIAL HISTORY:  reports that he has never smoked. He has never used smokeless tobacco. He reports that he drinks alcohol. He reports that he does not use illicit drugs.  ALLERGIES: Review of patient's allergies indicates no known allergies.  MEDICATIONS:  Current Outpatient Prescriptions  Medication Sig Dispense Refill  . acetaminophen (TYLENOL) 500 MG tablet Take 1,000 mg by mouth at bedtime.       Marland Kitchen aspirin 325 MG tablet Take 325 mg by mouth daily.        . B Complex-C (B-COMPLEX WITH VITAMIN C) tablet Take 1 tablet by mouth  daily.        . cetirizine (ZYRTEC) 10 MG tablet Take 10 mg by mouth daily.        . Cholecalciferol (VITAMIN D) 2000 UNITS tablet Take 2,000 Units by mouth daily.        Boris Lown Oil 300 MG CAPS Take 1 capsule by mouth daily.       Marland Kitchen lisinopril-hydrochlorothiazide (PRINZIDE,ZESTORETIC) 10-12.5 MG per tablet TAKE 1 TABLET BY MOUTH DAILY.  90 tablet  1  . meloxicam (MOBIC) 15 MG tablet Take 1 tablet by mouth Daily.      . Multiple Vitamins-Minerals (MULTIVITAMIN WITH MINERALS) tablet Take 1 tablet by mouth daily.        . niacin 100 MG tablet Take 100 mg by mouth daily with breakfast.        . omeprazole (PRILOSEC) 20 MG capsule Take 20 mg by mouth See admin instructions. Takes 1 capsule every 3rd day      . simvastatin (ZOCOR) 20 MG tablet TAKE 1 TABLET BY MOUTH AT BEDTIME.  90 tablet  1  . vardenafil (LEVITRA) 20 MG tablet Take 20 mg by mouth daily as needed for erectile dysfunction.       No current facility-administered medications for this encounter.    REVIEW OF SYSTEMS:  A 15 point review of systems is documented in the electronic medical record. This was obtained by the nursing staff. However, I reviewed this with the patient to discuss relevant findings and make appropriate changes.  A comprehensive review of  systems was negative..  The patient completed an IPSS and IIEF questionnaire.  His IPSS score was 4 indicating mild urinary outflow obstructive symptoms.  He indicated that his erectile function is untested in recent months but he has used Orthoptist.   PHYSICAL EXAM: This patient is in no acute distress.  He is alert and oriented.   height is 6\' 1"  (1.854 m) and weight is 228 lb 4.8 oz (103.556 kg). His oral temperature is 97.6 F (36.4 C). His blood pressure is 121/71 and his pulse is 81. His respiration is 16 and oxygen saturation is 100%.  He exhibits no respiratory distress or labored breathing.  He appears neurologically intact.  His mood is pleasant.  His affect is appropriate.   Please note the digital rectal exam findings described above.  KPS = 100  100 - Normal; no complaints; no evidence of disease. 90   - Able to carry on normal activity; minor signs or symptoms of disease. 80   - Normal activity with effort; some signs or symptoms of disease. 35   - Cares for self; unable to carry on normal activity or to do active work. 60   - Requires occasional assistance, but is able to care for most of his personal needs. 50   - Requires considerable assistance and frequent medical care. 40   - Disabled; requires special care and assistance. 30   - Severely disabled; hospital admission is indicated although death not imminent. 20   - Very sick; hospital admission necessary; active supportive treatment necessary. 10   - Moribund; fatal processes progressing rapidly. 0     - Dead  Karnofsky DA, Abelmann WH, Craver LS and [facility] [phone]) The use of the nitrogen mustards in the palliative treatment of carcinoma: with particular reference to bronchogenic carcinoma Cancer 1 634-56   LABORATORY DATA:  Lab Results  Component Value Date   WBC 6.3 01/24/2012   HGB 15.6 01/24/2012   HCT 46.1 01/24/2012   MCV 98.3* 01/24/2012   PLT 249 01/21/2012   Lab Results  Component Value Date   NA 136 03/22/2013   K 3.9 03/22/2013   CL 103 03/22/2013   CO2 24 03/22/2013   Lab Results  Component Value Date   ALT 41 03/22/2013   AST 31 03/22/2013   ALKPHOS 40 03/22/2013   BILITOT 0.6 03/22/2013     RADIOGRAPHY: No results found.    IMPRESSION: This gentleman is a  57 y.o. gentleman with stage T2a adenocarcinoma of the prostate with a Gleason's score of 3+4 and a pre-prostatectomy PSA of 3.05 and current PSA 6 1/2 year post-prostatectomy of 0.24.  He may benefit from possible salvage prostatic fossa radiotherapy.  PLAN:Today, I talked to the patient and family about the findings and work-up thus far.  We discussed the natural history of disease and general treatment, highlighting the  role or radiotherapy in the management.  We discussed the available radiation techniques, and focused on the details of logistics and delivery.  We reviewed the anticipated acute and late sequelae associated with radiation in this setting.  The patient was encouraged to ask questions that I answered to the best of my ability.  I filled out a patient counseling form during our discussion including treatment diagrams.  We retained a copy for our records.  The patient would like to proceed with radiation and will be scheduled for CT simulation in January to avoid disrupting his [employer] work during the holidays.  He needs an  8 am treatment time, so, I will try to advance schedule 38 daily TomoTherapy treatments from 09/16/13 on, now to reserve the coveted slot.  I spent 60 minutes minutes face to face with the patient and more than 50% of that time was spent in counseling and/or coordination of care.   ------------------------------------------------  Artist Pais. Kathrynn Running, M.D.

## 2013-08-04 NOTE — Progress Notes (Signed)
Complete PATIENT MEASURE OF DISTRESS worksheet with a score of 0 submitted to social work.  

## 2013-08-09 ENCOUNTER — Other Ambulatory Visit: Payer: Self-pay | Admitting: Internal Medicine

## 2013-08-27 ENCOUNTER — Ambulatory Visit
Admission: RE | Admit: 2013-08-27 | Discharge: 2013-08-27 | Disposition: A | Discharge status: Home / Self Care | Payer: BC Managed Care – PPO | Source: Ambulatory Visit | Attending: Radiation Oncology | Admitting: Radiation Oncology

## 2013-08-27 ENCOUNTER — Encounter: Payer: Self-pay | Admitting: Radiation Oncology

## 2013-08-27 DIAGNOSIS — C61 Malignant neoplasm of prostate: Secondary | ICD-10-CM | POA: Insufficient documentation

## 2013-08-27 DIAGNOSIS — Z510 Encounter for antineoplastic radiation therapy: Secondary | ICD-10-CM | POA: Insufficient documentation

## 2013-08-27 DIAGNOSIS — L819 Disorder of pigmentation, unspecified: Secondary | ICD-10-CM | POA: Insufficient documentation

## 2013-08-27 DIAGNOSIS — R351 Nocturia: Secondary | ICD-10-CM | POA: Insufficient documentation

## 2013-08-27 NOTE — Progress Notes (Signed)
  Radiation Oncology         [phone]) [phone] ________________________________  Name: Travis Ellis MRN: [id number]  Date: 08/27/2013  DOB: [birth year]  SIMULATION AND TREATMENT PLANNING NOTE  DIAGNOSIS:  57 y.o. gentleman with stage T2a adenocarcinoma of the prostate with a Gleason's score of 3+4 and a pre-prostatectomy PSA of 3.05 and current PSA 6 1/2 year post-prostatectomy of 0.24  NARRATIVE:  The patient was brought to the CT Simulation planning suite.  Identity was confirmed.  All relevant records and images related to the planned course of therapy were reviewed.  The patient freely provided informed written consent to proceed with treatment after reviewing the details related to the planned course of therapy. The consent form was witnessed and verified by the simulation staff.  Then, the patient was set-up in a stable reproducible supine position for radiation therapy.  A vacuum lock pillow device was custom fabricated to position his legs in a reproducible immobilized position.  Then, I performed a urethrogram under sterile conditions to identify the prostatic apex.  CT images were obtained.  Surface markings were placed.  The CT images were loaded into the planning software.  Then the prostate target and avoidance structures including the rectum, bladder, bowel and hips were contoured.  Treatment planning then occurred.  The radiation prescription was entered and confirmed.  A total of one complex treatment device was fabricated. I have requested : Intensity Modulated Radiotherapy (IMRT) is medically necessary for this case for the following reason:  Rectal sparing.Marland Kitchen  PLAN:  The patient will receive 68.4 Gy in 38 fractions.  ________________________________  Artist Pais Kathrynn Running, M.D.

## 2013-09-06 ENCOUNTER — Ambulatory Visit
Admission: RE | Admit: 2013-09-06 | Discharge: 2013-09-06 | Disposition: A | Discharge status: Home / Self Care | Payer: BC Managed Care – PPO | Source: Ambulatory Visit | Attending: Radiation Oncology | Admitting: Radiation Oncology

## 2013-09-06 DIAGNOSIS — C61 Malignant neoplasm of prostate: Secondary | ICD-10-CM

## 2013-09-07 ENCOUNTER — Ambulatory Visit
Admission: RE | Admit: 2013-09-07 | Discharge: 2013-09-07 | Disposition: A | Discharge status: Home / Self Care | Payer: BC Managed Care – PPO | Source: Ambulatory Visit | Attending: Radiation Oncology | Admitting: Radiation Oncology

## 2013-09-08 ENCOUNTER — Ambulatory Visit
Admission: RE | Admit: 2013-09-08 | Discharge: 2013-09-08 | Disposition: A | Discharge status: Home / Self Care | Payer: BC Managed Care – PPO | Source: Ambulatory Visit | Attending: Radiation Oncology | Admitting: Radiation Oncology

## 2013-09-09 ENCOUNTER — Ambulatory Visit
Admission: RE | Admit: 2013-09-09 | Discharge: 2013-09-09 | Disposition: A | Discharge status: Home / Self Care | Payer: BC Managed Care – PPO | Source: Ambulatory Visit | Attending: Radiation Oncology | Admitting: Radiation Oncology

## 2013-09-09 ENCOUNTER — Encounter: Payer: Self-pay | Admitting: Radiation Oncology

## 2013-09-09 VITALS — BP 127/79 | HR 87 | Resp 16 | Wt 224.3 lb

## 2013-09-09 DIAGNOSIS — C61 Malignant neoplasm of prostate: Secondary | ICD-10-CM

## 2013-09-09 NOTE — Progress Notes (Signed)
Denies nocturia, hematuria or dysuria. Denies diarrhea or fatigue. No complaints at this time. Oriented patient to staff and routine of the clinic. Educated patient reference potential side effects and management such as, fatigue, urinary/bladder changes, and fatigue. Provided patient with RADIATION THERAPY AND YOU handbook then, reviewed pertinent information.

## 2013-09-09 NOTE — Progress Notes (Signed)
  Radiation Oncology         [phone] ________________________________  Name: Travis Ellis MRN: [id number]  Date: 09/09/2013  DOB: Jan 27, 1956  Weekly Radiation Therapy Management  Current Dose: 7.2 Gy     Planned Dose:  68.4 Gy  Narrative . . . . . . . . The patient presents for routine under treatment assessment.                                   The patient is without complaint.                                 Set-up films were reviewed.                                 The chart was checked. Physical Findings. . .  weight is 224 lb 4.8 oz (101.742 kg). His blood pressure is 127/79 and his pulse is 87. His respiration is 16. . Weight essentially stable.  No significant changes. Impression . . . . . . . The patient is tolerating radiation. Plan . . . . . . . . . . . . Continue treatment as planned.  ________________________________  Sheral Apley. Tammi Klippel, M.D.

## 2013-09-10 ENCOUNTER — Ambulatory Visit
Admission: RE | Admit: 2013-09-10 | Discharge: 2013-09-10 | Disposition: A | Discharge status: Home / Self Care | Payer: BC Managed Care – PPO | Source: Ambulatory Visit | Attending: Radiation Oncology | Admitting: Radiation Oncology

## 2013-09-13 ENCOUNTER — Ambulatory Visit
Admission: RE | Admit: 2013-09-13 | Discharge: 2013-09-13 | Disposition: A | Discharge status: Home / Self Care | Payer: BC Managed Care – PPO | Source: Ambulatory Visit | Attending: Radiation Oncology | Admitting: Radiation Oncology

## 2013-09-14 ENCOUNTER — Ambulatory Visit
Admission: RE | Admit: 2013-09-14 | Discharge: 2013-09-14 | Disposition: A | Discharge status: Home / Self Care | Payer: BC Managed Care – PPO | Source: Ambulatory Visit | Attending: Radiation Oncology | Admitting: Radiation Oncology

## 2013-09-15 ENCOUNTER — Ambulatory Visit
Admission: RE | Admit: 2013-09-15 | Discharge: 2013-09-15 | Disposition: A | Discharge status: Home / Self Care | Payer: BC Managed Care – PPO | Source: Ambulatory Visit | Attending: Radiation Oncology | Admitting: Radiation Oncology

## 2013-09-15 ENCOUNTER — Encounter: Payer: Self-pay | Admitting: Radiation Oncology

## 2013-09-15 VITALS — BP 113/72 | HR 83 | Wt 224.0 lb

## 2013-09-15 DIAGNOSIS — C61 Malignant neoplasm of prostate: Secondary | ICD-10-CM

## 2013-09-15 NOTE — Progress Notes (Signed)
Patient reports that most night he doesn't haven't to get up to void at all. Denies hematuria or dysuria. Denies diarrhea or fatigue. Denies pain at this time.

## 2013-09-15 NOTE — Progress Notes (Signed)
  Radiation Oncology         (336) [phone] ________________________________  Name: Travis Ellis MRN: [id number]  Date: 09/15/2013  DOB: [birth year]  Weekly Radiation Therapy Management  Current Dose: 14.4 Gy     Planned Dose:  68.4 Gy  Narrative . . . . . . . . The patient presents for routine under treatment assessment.                                   The patient is without complaint.                                 Set-up films were reviewed.                                 The chart was checked. Physical Findings. . .  weight is 224 lb (101.606 kg). His blood pressure is 113/72 and his pulse is 83. . Weight essentially stable.  No significant changes. Impression . . . . . . . The patient is tolerating radiation. Plan . . . . . . . . . . . . Continue treatment as planned.  ________________________________  Sheral Apley. Tammi Klippel, M.D.

## 2013-09-16 ENCOUNTER — Ambulatory Visit
Admission: RE | Admit: 2013-09-16 | Discharge: 2013-09-16 | Disposition: A | Discharge status: Home / Self Care | Payer: BC Managed Care – PPO | Source: Ambulatory Visit | Attending: Radiation Oncology | Admitting: Radiation Oncology

## 2013-09-17 ENCOUNTER — Ambulatory Visit
Admission: RE | Admit: 2013-09-17 | Discharge: 2013-09-17 | Disposition: A | Discharge status: Home / Self Care | Payer: BC Managed Care – PPO | Source: Ambulatory Visit | Attending: Radiation Oncology | Admitting: Radiation Oncology

## 2013-09-20 ENCOUNTER — Ambulatory Visit
Admission: RE | Admit: 2013-09-20 | Discharge: 2013-09-20 | Disposition: A | Discharge status: Home / Self Care | Payer: BC Managed Care – PPO | Source: Ambulatory Visit | Attending: Radiation Oncology | Admitting: Radiation Oncology

## 2013-09-21 ENCOUNTER — Ambulatory Visit
Admission: RE | Admit: 2013-09-21 | Discharge: 2013-09-21 | Disposition: A | Discharge status: Home / Self Care | Payer: BC Managed Care – PPO | Source: Ambulatory Visit | Attending: Radiation Oncology | Admitting: Radiation Oncology

## 2013-09-22 ENCOUNTER — Ambulatory Visit
Admission: RE | Admit: 2013-09-22 | Discharge: 2013-09-22 | Disposition: A | Discharge status: Home / Self Care | Payer: BC Managed Care – PPO | Source: Ambulatory Visit | Attending: Radiation Oncology | Admitting: Radiation Oncology

## 2013-09-23 ENCOUNTER — Ambulatory Visit
Admission: RE | Admit: 2013-09-23 | Discharge: 2013-09-23 | Disposition: A | Discharge status: Home / Self Care | Payer: BC Managed Care – PPO | Source: Ambulatory Visit | Attending: Radiation Oncology | Admitting: Radiation Oncology

## 2013-09-24 ENCOUNTER — Ambulatory Visit
Admission: RE | Admit: 2013-09-24 | Discharge: 2013-09-24 | Disposition: A | Discharge status: Home / Self Care | Payer: BC Managed Care – PPO | Source: Ambulatory Visit | Attending: Radiation Oncology | Admitting: Radiation Oncology

## 2013-09-24 ENCOUNTER — Encounter: Payer: Self-pay | Admitting: Radiation Oncology

## 2013-09-24 VITALS — BP 119/74 | HR 74 | Resp 16 | Wt 221.5 lb

## 2013-09-24 DIAGNOSIS — C61 Malignant neoplasm of prostate: Secondary | ICD-10-CM

## 2013-09-24 NOTE — Progress Notes (Signed)
  Radiation Oncology         (336) [phone] ________________________________  Name: Travis Ellis MRN: [id number]  Date: 09/24/2013  DOB: 09-May-1956  Weekly Radiation Therapy Management  Current Dose: 27 Gy     Planned Dose:  68.4 Gy  Narrative . . . . . . . . The patient presents for routine under treatment assessment.                                   The patient is without complaint.                                 Set-up films were reviewed.                                 The chart was checked. Physical Findings. . .  weight is 221 lb 8 oz (100.472 kg). His blood pressure is 119/74 and his pulse is 74. His respiration is 16. . Weight essentially stable.  No significant changes. Impression . . . . . . . The patient is tolerating radiation. Plan . . . . . . . . . . . . Continue treatment as planned.  ________________________________  Sheral Apley. Tammi Klippel, M.D.

## 2013-09-24 NOTE — Progress Notes (Signed)
Denies diarrhea. Denies pain associated with bowel movements or blood in stool. Reports most nights he doesn't get up at all to void. Reports he only got up once one night this week to void. Denies pain at this time. Denies dysuria or hematuria. No significant changes. Denies fatigue. Explains he is driving to [place] today for work.

## 2013-09-27 ENCOUNTER — Ambulatory Visit
Admission: RE | Admit: 2013-09-27 | Discharge: 2013-09-27 | Disposition: A | Discharge status: Home / Self Care | Payer: BC Managed Care – PPO | Source: Ambulatory Visit | Attending: Radiation Oncology | Admitting: Radiation Oncology

## 2013-09-28 ENCOUNTER — Ambulatory Visit
Admission: RE | Admit: 2013-09-28 | Discharge: 2013-09-28 | Disposition: A | Discharge status: Home / Self Care | Payer: BC Managed Care – PPO | Source: Ambulatory Visit | Attending: Radiation Oncology | Admitting: Radiation Oncology

## 2013-09-28 DIAGNOSIS — C61 Malignant neoplasm of prostate: Secondary | ICD-10-CM

## 2013-09-28 MED ORDER — RADIAPLEXRX EX GEL
Freq: Once | CUTANEOUS | Status: AC
  Administered 2013-09-28: 09:00:00 via TOPICAL

## 2013-09-29 ENCOUNTER — Ambulatory Visit
Admission: RE | Admit: 2013-09-29 | Discharge: 2013-09-29 | Disposition: A | Discharge status: Home / Self Care | Payer: BC Managed Care – PPO | Source: Ambulatory Visit | Attending: Radiation Oncology | Admitting: Radiation Oncology

## 2013-09-30 ENCOUNTER — Ambulatory Visit
Admission: RE | Admit: 2013-09-30 | Discharge: 2013-09-30 | Disposition: A | Discharge status: Home / Self Care | Payer: BC Managed Care – PPO | Source: Ambulatory Visit | Attending: Radiation Oncology | Admitting: Radiation Oncology

## 2013-10-01 ENCOUNTER — Ambulatory Visit
Admission: RE | Admit: 2013-10-01 | Discharge: 2013-10-01 | Disposition: A | Discharge status: Home / Self Care | Payer: BC Managed Care – PPO | Source: Ambulatory Visit | Attending: Radiation Oncology | Admitting: Radiation Oncology

## 2013-10-01 ENCOUNTER — Encounter: Payer: Self-pay | Admitting: Radiation Oncology

## 2013-10-01 VITALS — BP 114/76 | HR 80 | Resp 16 | Wt 221.6 lb

## 2013-10-01 DIAGNOSIS — C61 Malignant neoplasm of prostate: Secondary | ICD-10-CM

## 2013-10-01 NOTE — Progress Notes (Signed)
Patient reports mild hyperpigmentation of anterior pelvis area. Reports using radiaplex bid as directed. Denies hematuria or dysuria. Denies diarrhea. Reports that he worked overtime this week thus he feels tired but, doesn't believe its related to radiation.

## 2013-10-01 NOTE — Progress Notes (Signed)
  Radiation Oncology         [phone] ________________________________  Name: Travis Ellis MRN: [id number]  Date: 10/01/2013  DOB: 12-22-1955  Weekly Radiation Therapy Management  Current Dose: 36 Gy     Planned Dose:  68.4 Gy  Narrative . . . . . . . . The patient presents for routine under treatment assessment.                                   The patient is without complaint.                                 Set-up films were reviewed.                                 The chart was checked. Physical Findings. . .  weight is 221 lb 9.6 oz (100.517 kg). His blood pressure is 114/76 and his pulse is 80. His respiration is 16. . Weight essentially stable.  No significant changes. Impression . . . . . . . The patient is tolerating radiation. Plan . . . . . . . . . . . . Continue treatment as planned.  ________________________________  Sheral Apley. Tammi Klippel, M.D.

## 2013-10-04 ENCOUNTER — Ambulatory Visit
Admission: RE | Admit: 2013-10-04 | Discharge: 2013-10-04 | Disposition: A | Discharge status: Home / Self Care | Payer: BC Managed Care – PPO | Source: Ambulatory Visit | Attending: Radiation Oncology | Admitting: Radiation Oncology

## 2013-10-04 ENCOUNTER — Ambulatory Visit: Payer: BC Managed Care – PPO | Admitting: Internal Medicine

## 2013-10-05 ENCOUNTER — Ambulatory Visit
Admission: RE | Admit: 2013-10-05 | Discharge: 2013-10-05 | Disposition: A | Discharge status: Home / Self Care | Payer: BC Managed Care – PPO | Source: Ambulatory Visit | Attending: Radiation Oncology | Admitting: Radiation Oncology

## 2013-10-06 ENCOUNTER — Ambulatory Visit
Admission: RE | Admit: 2013-10-06 | Discharge: 2013-10-06 | Disposition: A | Discharge status: Home / Self Care | Payer: BC Managed Care – PPO | Source: Ambulatory Visit | Attending: Radiation Oncology | Admitting: Radiation Oncology

## 2013-10-07 ENCOUNTER — Ambulatory Visit
Admission: RE | Admit: 2013-10-07 | Discharge: 2013-10-07 | Disposition: A | Discharge status: Home / Self Care | Payer: BC Managed Care – PPO | Source: Ambulatory Visit | Attending: Radiation Oncology | Admitting: Radiation Oncology

## 2013-10-08 ENCOUNTER — Ambulatory Visit
Admission: RE | Admit: 2013-10-08 | Discharge: 2013-10-08 | Disposition: A | Discharge status: Home / Self Care | Payer: BC Managed Care – PPO | Source: Ambulatory Visit | Attending: Radiation Oncology | Admitting: Radiation Oncology

## 2013-10-08 ENCOUNTER — Encounter: Payer: Self-pay | Admitting: Radiation Oncology

## 2013-10-08 VITALS — Resp 18 | Wt 220.0 lb

## 2013-10-08 DIAGNOSIS — C61 Malignant neoplasm of prostate: Secondary | ICD-10-CM

## 2013-10-08 NOTE — Progress Notes (Signed)
  Radiation Oncology         [phone] ________________________________  Name: Travis Ellis MRN: [id number]  Date: 10/08/2013  DOB: 1956-07-10  Weekly Radiation Therapy Management  Current Dose: 45 Gy     Planned Dose:  68.4 Gy  Narrative . . . . . . . . The patient presents for routine under treatment assessment.                                  Patient reports mild hyperpigmentation of anterior pelvis area. Reports using radiaplex bid as directed. Denies hematuria or dysuria. Denies diarrhea. Reports a strong steady urine stream. Reports nocturia x 1  The patient is without complaint.                                 Set-up films were reviewed.                                 The chart was checked. Physical Findings. . .  weight is 220 lb (99.791 kg). His respiration is 18. . Weight essentially stable.  No significant changes. Impression . . . . . . . The patient is tolerating radiation. Plan . . . . . . . . . . . . Continue treatment as planned.  ________________________________  Sheral Apley. Tammi Klippel, M.D.

## 2013-10-08 NOTE — Progress Notes (Signed)
Patient reports mild hyperpigmentation of anterior pelvis area. Reports using radiaplex bid as directed. Denies hematuria or dysuria. Denies diarrhea. Reports a strong steady urine stream. Reports nocturia x 1.

## 2013-10-11 ENCOUNTER — Ambulatory Visit
Admission: RE | Admit: 2013-10-11 | Discharge: 2013-10-11 | Disposition: A | Discharge status: Home / Self Care | Payer: BC Managed Care – PPO | Source: Ambulatory Visit | Attending: Radiation Oncology | Admitting: Radiation Oncology

## 2013-10-12 ENCOUNTER — Ambulatory Visit
Admission: RE | Admit: 2013-10-12 | Discharge: 2013-10-12 | Disposition: A | Discharge status: Home / Self Care | Payer: BC Managed Care – PPO | Source: Ambulatory Visit | Attending: Radiation Oncology | Admitting: Radiation Oncology

## 2013-10-13 ENCOUNTER — Ambulatory Visit
Admission: RE | Admit: 2013-10-13 | Discharge: 2013-10-13 | Disposition: A | Discharge status: Home / Self Care | Payer: BC Managed Care – PPO | Source: Ambulatory Visit | Attending: Radiation Oncology | Admitting: Radiation Oncology

## 2013-10-13 ENCOUNTER — Encounter: Payer: Self-pay | Admitting: Radiation Oncology

## 2013-10-13 VITALS — BP 108/71 | HR 73 | Temp 97.7°F | Ht 73.0 in | Wt 220.5 lb

## 2013-10-13 DIAGNOSIS — C61 Malignant neoplasm of prostate: Secondary | ICD-10-CM

## 2013-10-13 NOTE — Progress Notes (Signed)
  Radiation Oncology         [phone] ________________________________  Name: Travis Ellis MRN: [id number]  Date: 10/13/2013  DOB: Jan 23, 1956  Weekly Radiation Therapy Management  Current Dose: 56 Gy     Planned Dose:  60 Gy  Narrative . . . . . . . . The patient presents for routine under treatment assessment.                                   The patient is without complaint.  She has some discomfort at her surgical site.                                 Set-up films were reviewed.                                 The chart was checked. Physical Findings. . .  height is 6\' 1"  (1.854 m) and weight is 220 lb 8 oz (100.018 kg). His temperature is 97.7 F (36.5 C). His blood pressure is 108/71 and his pulse is 73. . Weight essentially stable.  No significant changes. Impression . . . . . . . The patient is tolerating radiation. Plan . . . . . . . . . . . . Continue treatment as planned.  ________________________________  Sheral Apley. Tammi Klippel, M.D.

## 2013-10-13 NOTE — Progress Notes (Addendum)
Travis Ellis has had 28 fractions to his prostate.  He denies pain, hematuria, dysuria, fatigue, diarrhea and urinary frequency.  He reports sometimes getting up once per night to urinate.  Uses radiaplex occasionally.

## 2013-10-14 ENCOUNTER — Ambulatory Visit
Admission: RE | Admit: 2013-10-14 | Discharge: 2013-10-14 | Disposition: A | Discharge status: Home / Self Care | Payer: BC Managed Care – PPO | Source: Ambulatory Visit | Attending: Radiation Oncology | Admitting: Radiation Oncology

## 2013-10-15 ENCOUNTER — Ambulatory Visit
Admission: RE | Admit: 2013-10-15 | Discharge: 2013-10-15 | Disposition: A | Discharge status: Home / Self Care | Payer: BC Managed Care – PPO | Source: Ambulatory Visit | Attending: Radiation Oncology | Admitting: Radiation Oncology

## 2013-10-18 ENCOUNTER — Ambulatory Visit
Admission: RE | Admit: 2013-10-18 | Discharge: 2013-10-18 | Disposition: A | Discharge status: Home / Self Care | Payer: BC Managed Care – PPO | Source: Ambulatory Visit | Attending: Radiation Oncology | Admitting: Radiation Oncology

## 2013-10-19 ENCOUNTER — Ambulatory Visit
Admission: RE | Admit: 2013-10-19 | Discharge: 2013-10-19 | Disposition: A | Discharge status: Home / Self Care | Payer: BC Managed Care – PPO | Source: Ambulatory Visit | Attending: Radiation Oncology | Admitting: Radiation Oncology

## 2013-10-19 ENCOUNTER — Encounter: Payer: Self-pay | Admitting: Radiation Oncology

## 2013-10-19 NOTE — Progress Notes (Signed)
  Radiation Oncology         [phone] ________________________________  Name: Travis Ellis MRN: [id number]  Date: 10/20/2013  DOB: Jul 31, 1956  Weekly Radiation Therapy Management  Current Dose: 59.4 Gy     Planned Dose:  68.4 Gy  Narrative . . . . . . . . The patient presents for routine under treatment assessment.                                   The patient is without complaint.                                 Set-up films were reviewed.                                 The chart was checked. Physical Findings. . .  weight is 219 lb 4.8 oz (99.474 kg). His blood pressure is 110/80 and his pulse is 85. His respiration is 16. . Weight essentially stable.  No significant changes. Impression . . . . . . . The patient is tolerating radiation. Plan . . . . . . . . . . . . Continue treatment as planned.  ________________________________  Sheral Apley. Tammi Klippel, M.D.

## 2013-10-20 ENCOUNTER — Encounter: Payer: Self-pay | Admitting: Radiation Oncology

## 2013-10-20 ENCOUNTER — Ambulatory Visit
Admission: RE | Admit: 2013-10-20 | Discharge: 2013-10-20 | Disposition: A | Discharge status: Home / Self Care | Payer: BC Managed Care – PPO | Source: Ambulatory Visit | Attending: Radiation Oncology | Admitting: Radiation Oncology

## 2013-10-20 VITALS — BP 110/80 | HR 85 | Resp 16 | Wt 219.3 lb

## 2013-10-20 DIAGNOSIS — C61 Malignant neoplasm of prostate: Secondary | ICD-10-CM

## 2013-10-20 NOTE — Progress Notes (Signed)
Patient denies pain, hematuria, dysuria, fatigue, diarrhea and urinary frequency. He reports "sometimes" getting up once per night to urinate. Uses radiaplex occasionally.

## 2013-10-21 ENCOUNTER — Ambulatory Visit
Admission: RE | Admit: 2013-10-21 | Discharge: 2013-10-21 | Disposition: A | Discharge status: Home / Self Care | Payer: BC Managed Care – PPO | Source: Ambulatory Visit | Attending: Radiation Oncology | Admitting: Radiation Oncology

## 2013-10-22 ENCOUNTER — Ambulatory Visit
Admission: RE | Admit: 2013-10-22 | Discharge: 2013-10-22 | Disposition: A | Discharge status: Home / Self Care | Payer: BC Managed Care – PPO | Source: Ambulatory Visit | Attending: Radiation Oncology | Admitting: Radiation Oncology

## 2013-10-25 ENCOUNTER — Ambulatory Visit
Admission: RE | Admit: 2013-10-25 | Discharge: 2013-10-25 | Disposition: A | Discharge status: Home / Self Care | Payer: BC Managed Care – PPO | Source: Ambulatory Visit | Attending: Radiation Oncology | Admitting: Radiation Oncology

## 2013-10-26 ENCOUNTER — Ambulatory Visit
Admission: RE | Admit: 2013-10-26 | Discharge: 2013-10-26 | Disposition: A | Discharge status: Home / Self Care | Payer: BC Managed Care – PPO | Source: Ambulatory Visit | Attending: Radiation Oncology | Admitting: Radiation Oncology

## 2013-10-27 ENCOUNTER — Encounter: Payer: Self-pay | Admitting: Radiation Oncology

## 2013-10-27 ENCOUNTER — Ambulatory Visit
Admission: RE | Admit: 2013-10-27 | Discharge: 2013-10-27 | Disposition: A | Discharge status: Home / Self Care | Payer: BC Managed Care – PPO | Source: Ambulatory Visit | Attending: Radiation Oncology | Admitting: Radiation Oncology

## 2013-10-27 VITALS — BP 109/70 | HR 70 | Temp 98.0°F | Resp 20 | Wt 221.8 lb

## 2013-10-27 DIAGNOSIS — C61 Malignant neoplasm of prostate: Secondary | ICD-10-CM

## 2013-10-27 MED ORDER — RADIAPLEXRX EX GEL
Freq: Once | CUTANEOUS | Status: AC
  Administered 2013-10-27: 09:00:00 via TOPICAL

## 2013-10-27 MED ORDER — RADIAPLEXRX EX GEL
Freq: Once | CUTANEOUS | Status: AC
  Administered 2013-10-27: 10:00:00 via TOPICAL

## 2013-10-27 NOTE — Progress Notes (Signed)
  Radiation Oncology         [phone] ________________________________  Name: JAIREN GOLDFARB MRN: [id number]  Date: 10/27/2013  DOB: 1956-07-29  Weekly Radiation Therapy Management  Current Dose: 68.4 Gy     Planned Dose:  68.4 Gy  Narrative . . . . . . . . The patient presents for the final under treatment assessment.                                            The patient has had some continuation of previously noted minimal bladder symptoms.                                 Set-up films were reviewed.                                 The chart was checked. Physical Findings. . . Weight essentially stable.  No significant changes. Impression . . . . . . . The patient tolerated radiation relatively well. Plan . . . . . . . . . . . . Complete radiation today as scheduled, and follow-up in one month. The patient was encouraged to call or return to the clinic in the interim for any worsening symptoms.  ________________________________  Sheral Apley Tammi Klippel, M.D.

## 2013-10-27 NOTE — Addendum Note (Signed)
Encounter addended by: Heywood Footman, RN on: 10/27/2013 10:23 AM<BR>     Documentation filed: Inpatient MAR, Orders

## 2013-10-27 NOTE — Addendum Note (Signed)
Encounter addended by: Rebecca Eaton, RN on: 10/27/2013  8:52 AM<BR>     Documentation filed: Inpatient MAR

## 2013-10-27 NOTE — Progress Notes (Signed)
Weekly rad txs, 38/38 completed prostate, no dysuria, no hematuria, no fatigue stated, no pain, asked for another radiaplex gel, froin area pink/irritated only, appetite good, no c/o nausea, regular bowel movements, follow up 1 month  card given to patient 8:42 AM

## 2013-10-28 ENCOUNTER — Ambulatory Visit: Payer: BC Managed Care – PPO

## 2013-10-29 ENCOUNTER — Ambulatory Visit: Payer: BC Managed Care – PPO

## 2013-10-29 ENCOUNTER — Ambulatory Visit: Payer: BC Managed Care – PPO | Admitting: Radiation Oncology

## 2013-10-30 NOTE — Progress Notes (Signed)
  Radiation Oncology         [phone] ________________________________  Name: Travis Ellis MRN: [id number]  Date: 10/27/2013  DOB: 02/23/1956  End of Treatment Note  Diagnosis:   58 y.o. gentleman with stage T2a adenocarcinoma of the prostate with a Gleason's score of 3+4 and a pre-prostatectomy PSA of 3.05 and current PSA 6 1/2 year post-prostatectomy of 0.24  Indication for treatment:  Salvage, curative       Radiation treatment dates:   09/06/2013-10/27/2013  Site/dose:   The prostatic fossa was treated to a total dose of 68.4 Gy in 38 fractions  Beams/energy:   The patient was treated using helical intensity modulated radiotherapy with 6 megavolt photons. Image guidance was performed with daily megavoltage CT imaging to outline the prostatic fossa within the target volume.  Narrative: The patient tolerated radiation treatment relatively well.   The patient did not experience any dysuria or hematuria. 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 He did experience some erythema and irritation of the skin within the groin. He did not experience any diarrhea.  Plan: The patient has completed radiation treatment. The patient will return to radiation oncology clinic for routine followup in one month. I advised the patient and his wife to call or return sooner if they have any questions or concerns related to their recovery or treatment. ________________________________  Sheral Apley. Tammi Klippel, M.D.

## 2013-11-05 ENCOUNTER — Telehealth: Payer: Self-pay

## 2013-11-05 NOTE — Telephone Encounter (Signed)
Dr Elder Cyphers, do you want to give pt 90 day supply of RFs? Pended.

## 2013-11-05 NOTE — Telephone Encounter (Signed)
Patient's wife states that patient is currently going through radiation treatments with Dr. Tammi Klippel (oncologist). Patient needs a refill on Simvastatin and Lisinopril, both a 90 day supply. Wife also states that per the oncologist patient is not be seen by his regular physician until patient has completed radiation.   [phone] Santiago Glad wife)

## 2013-11-08 MED ORDER — SIMVASTATIN 20 MG PO TABS: ORAL_TABLET | ORAL | Status: DC

## 2013-11-08 MED ORDER — LISINOPRIL-HYDROCHLOROTHIAZIDE 10-12.5 MG PO TABS: ORAL_TABLET | ORAL | Status: DC

## 2013-11-08 NOTE — Telephone Encounter (Signed)
Ok to rf for 90 days

## 2013-11-08 NOTE — Telephone Encounter (Signed)
Sent in RFs and notified wife. She stated pt will be in to see Dr Elder Cyphers after he sees Dr Tammi Klippel end of March and gets the [facility].

## 2013-11-25 ENCOUNTER — Encounter: Payer: Self-pay | Admitting: Radiation Oncology

## 2013-11-25 ENCOUNTER — Telehealth: Payer: Self-pay | Admitting: *Deleted

## 2013-11-25 ENCOUNTER — Ambulatory Visit
Admission: RE | Admit: 2013-11-25 | Discharge: 2013-11-25 | Disposition: A | Discharge status: Home / Self Care | Payer: BC Managed Care – PPO | Source: Ambulatory Visit | Attending: Radiation Oncology | Admitting: Radiation Oncology

## 2013-11-25 VITALS — BP 123/76 | HR 74 | Temp 98.0°F | Resp 18 | Wt 220.4 lb

## 2013-11-25 DIAGNOSIS — C61 Malignant neoplasm of prostate: Secondary | ICD-10-CM

## 2013-11-25 NOTE — Progress Notes (Signed)
Radiation Oncology         [phone] ________________________________  Name: Travis Ellis MRN: [id number]  Date: 11/25/2013  DOB: May 01, 1956  Follow-Up Visit Note  CC: GUEST, Veneda Melter, MD  Dutch Gray, MD  Diagnosis:   58 y.o. gentleman with stage T2a adenocarcinoma of the prostate with a Gleason's score of 3+4 and a pre-prostatectomy PSA of 3.05 and current PSA 6 1/2 year post-prostatectomy of 0.24  Interval Since Last Radiation:  4  weeks  Narrative:  The patient returns today for routine follow-up.  Denies pain. Denies night sweats, headache, dizziness, nausea or vomiting. Denies dysuria or hematuria. Reports energy level has greatly improved. Reports occasional nocturia x1. Reports radiated skin has returned to normal. Denies diarrhea                              ALLERGIES:  has No Known Allergies.  Meds: Current Outpatient Prescriptions  Medication Sig Dispense Refill  . acetaminophen (TYLENOL) 500 MG tablet Take 1,000 mg by mouth at bedtime.       . B Complex-C (B-COMPLEX WITH VITAMIN C) tablet Take 1 tablet by mouth daily.        . cetirizine (ZYRTEC) 10 MG tablet Take 10 mg by mouth daily.        . Cholecalciferol (VITAMIN D) 2000 UNITS tablet Take 2,000 Units by mouth daily.        Javier Docker Oil 300 MG CAPS Take 1 capsule by mouth daily.       Marland Kitchen lisinopril-hydrochlorothiazide (PRINZIDE,ZESTORETIC) 10-12.5 MG per tablet TAKE 1 TABLET BY MOUTH DAILY.  90 tablet  0  . meloxicam (MOBIC) 15 MG tablet Take 1 tablet by mouth Daily.      . Multiple Vitamins-Minerals (MULTIVITAMIN WITH MINERALS) tablet Take 1 tablet by mouth daily.        . niacin 100 MG tablet Take 100 mg by mouth daily with breakfast.        . omeprazole (PRILOSEC) 20 MG capsule Take 20 mg by mouth See admin instructions. Takes 1 capsule every 3rd day      . simvastatin (ZOCOR) 20 MG tablet TAKE 1 TABLET BY MOUTH AT BEDTIME.  90 tablet  0  . vardenafil (LEVITRA) 20 MG tablet Take 20 mg by mouth daily as  needed for erectile dysfunction.      Marland Kitchen aspirin 325 MG tablet Take 325 mg by mouth daily.        . hyaluronate sodium (RADIAPLEXRX) GEL Apply 1 application topically 2 (two) times daily.      . Wound Cleansers (RADIAPLEX EX) Apply topically.       No current facility-administered medications for this encounter.    Physical Findings: The patient is in no acute distress. Patient is alert and oriented.  weight is 220 lb 6.4 oz (99.973 kg). His oral temperature is 98 F (36.7 C). His blood pressure is 123/76 and his pulse is 74. His respiration is 18 and oxygen saturation is 100%. .  No significant changes.  Lab Findings: Lab Results  Component Value Date   WBC 6.3 01/24/2012   HGB 15.6 01/24/2012   HCT 46.1 01/24/2012   MCV 98.3* 01/24/2012   PLT 249 01/21/2012   Impression:  The patient is recovering from the effects of radiation.    Plan:  Follow-up with Dr. Alinda Money to track PSA response.  He will continue to follow-up with urology for ongoing PSA determinations.  I will look forward to following his response through their correspondence, and be happy to participate in care if clinically indicated.  I talked to the patient about what to expect in the future, including his risk for erectile dysfunction and rectal bleeding.  I encouraged him to call or return to the office if he has any question about his previous radiation or possible radiation effects.  He was comfortable with this plan.   _____________________________________  Sheral Apley. Tammi Klippel, M.D.

## 2013-11-25 NOTE — Telephone Encounter (Signed)
Called patient to inform of lab and fu with Dr. Alinda Money on 01-06-14 - anytime 8:30 am till 4 that day for labs and his fu visit on 01-12-14 - arrival time - 1:30 pm @ Dr. Lynne Logan Office, spoke with patient and he is aware of these appts.

## 2013-11-25 NOTE — Progress Notes (Signed)
Weight stable. Denies pain. Vital stables. Denies night sweats, headache, dizziness, nausea or vomiting. Denies dysuria or hematuria. Reports energy level has greatly improved. Reports occasional nocturia x1. Reports radiated skin has returned to normal. Denies diarrhea.

## 2013-12-23 ENCOUNTER — Ambulatory Visit (INDEPENDENT_AMBULATORY_CARE_PROVIDER_SITE_OTHER): Payer: BC Managed Care – PPO | Admitting: Family Medicine

## 2013-12-23 VITALS — BP 124/80 | HR 90 | Temp 97.5°F | Resp 18 | Ht 72.0 in | Wt 217.0 lb

## 2013-12-23 DIAGNOSIS — J069 Acute upper respiratory infection, unspecified: Secondary | ICD-10-CM

## 2013-12-23 DIAGNOSIS — C61 Malignant neoplasm of prostate: Secondary | ICD-10-CM

## 2013-12-23 MED ORDER — FLUTICASONE PROPIONATE 50 MCG/ACT NA SUSP: NASAL | Status: DC

## 2013-12-23 MED ORDER — AZITHROMYCIN 250 MG PO TABS: ORAL_TABLET | ORAL | Status: DC

## 2013-12-23 NOTE — Progress Notes (Signed)
Subjective

## 2013-12-23 NOTE — Patient Instructions (Signed)
Drink plenty of fluids  Use the fluticasone nose spray 2 sprays each nostril twice daily for 3 days, then drop back to once daily  Continue the antihistamine  If you develop purulent mucus, then get the azithromycin filled and take it as per instructions  Return if worse

## 2014-02-05 ENCOUNTER — Ambulatory Visit (INDEPENDENT_AMBULATORY_CARE_PROVIDER_SITE_OTHER): Payer: BC Managed Care – PPO | Admitting: Internal Medicine

## 2014-02-05 VITALS — BP 118/70 | HR 80 | Temp 98.0°F | Resp 18 | Ht 73.0 in | Wt 219.6 lb

## 2014-02-05 DIAGNOSIS — G5791 Unspecified mononeuropathy of right lower limb: Secondary | ICD-10-CM

## 2014-02-05 DIAGNOSIS — G5790 Unspecified mononeuropathy of unspecified lower limb: Secondary | ICD-10-CM

## 2014-02-05 DIAGNOSIS — Z79899 Other long term (current) drug therapy: Secondary | ICD-10-CM

## 2014-02-05 DIAGNOSIS — E785 Hyperlipidemia, unspecified: Secondary | ICD-10-CM

## 2014-02-05 DIAGNOSIS — I10 Essential (primary) hypertension: Secondary | ICD-10-CM

## 2014-02-05 DIAGNOSIS — Z8546 Personal history of malignant neoplasm of prostate: Secondary | ICD-10-CM

## 2014-02-05 LAB — COMPREHENSIVE METABOLIC PANEL
ALT: 34 U/L (ref 0–53)
AST: 27 U/L (ref 0–37)
Albumin: 4.4 g/dL (ref 3.5–5.2)
Alkaline Phosphatase: 40 U/L (ref 39–117)
BILIRUBIN TOTAL: 0.7 mg/dL (ref 0.2–1.2)
BUN: 27 mg/dL — ABNORMAL HIGH (ref 6–23)
CO2: 28 mEq/L (ref 19–32)
Calcium: 9.9 mg/dL (ref 8.4–10.5)
Chloride: 101 mEq/L (ref 96–112)
Creat: 0.64 mg/dL (ref 0.50–1.35)
Glucose, Bld: 104 mg/dL — ABNORMAL HIGH (ref 70–99)
Potassium: 4.2 mEq/L (ref 3.5–5.3)
SODIUM: 139 meq/L (ref 135–145)
TOTAL PROTEIN: 7.2 g/dL (ref 6.0–8.3)

## 2014-02-05 LAB — POCT UA - MICROSCOPIC ONLY
BACTERIA, U MICROSCOPIC: NEGATIVE
CASTS, UR, LPF, POC: NEGATIVE
CRYSTALS, UR, HPF, POC: NEGATIVE
Mucus, UA: NEGATIVE
RBC, urine, microscopic: NEGATIVE
WBC, Ur, HPF, POC: NEGATIVE
Yeast, UA: NEGATIVE

## 2014-02-05 LAB — POCT URINALYSIS DIPSTICK
Bilirubin, UA: NEGATIVE
Blood, UA: NEGATIVE
GLUCOSE UA: NEGATIVE
Ketones, UA: NEGATIVE
LEUKOCYTES UA: NEGATIVE
Nitrite, UA: NEGATIVE
Protein, UA: NEGATIVE
Spec Grav, UA: 1.015
UROBILINOGEN UA: 0.2
pH, UA: 7

## 2014-02-05 LAB — POCT CBC
Granulocyte percent: 68.3 %G (ref 37–80)
HCT, POC: 41.8 % — AB (ref 43.5–53.7)
Hemoglobin: 13.2 g/dL — AB (ref 14.1–18.1)
Lymph, poc: 0.9 (ref 0.6–3.4)
MCH, POC: 32.4 pg — AB (ref 27–31.2)
MCHC: 31.6 g/dL — AB (ref 31.8–35.4)
MCV: 102.7 fL — AB (ref 80–97)
MID (CBC): 0.4 (ref 0–0.9)
MPV: 9.3 fL (ref 0–99.8)
PLATELET COUNT, POC: 200 10*3/uL (ref 142–424)
POC Granulocyte: 2.9 (ref 2–6.9)
POC LYMPH %: 22.6 % (ref 10–50)
POC MID %: 9.1 % (ref 0–12)
RBC: 4.07 M/uL — AB (ref 4.69–6.13)
RDW, POC: 12.5 %
WBC: 4.2 10*3/uL — AB (ref 4.6–10.2)

## 2014-02-05 LAB — LIPID PANEL
CHOL/HDL RATIO: 4 ratio
CHOLESTEROL: 140 mg/dL (ref 0–200)
HDL: 35 mg/dL — ABNORMAL LOW (ref >39)
LDL Cholesterol: 71 mg/dL (ref 0–99)
Triglycerides: 168 mg/dL — ABNORMAL HIGH (ref <150)
VLDL: 34 mg/dL (ref 0–40)

## 2014-02-05 LAB — TSH: TSH: 0.748 u[IU]/mL (ref 0.350–4.500)

## 2014-02-05 MED ORDER — SIMVASTATIN 20 MG PO TABS: ORAL_TABLET | ORAL | Status: DC

## 2014-02-05 MED ORDER — LISINOPRIL-HYDROCHLOROTHIAZIDE 10-12.5 MG PO TABS: ORAL_TABLET | ORAL | Status: DC

## 2014-02-05 NOTE — Progress Notes (Signed)
   Subjective:    Patient ID: Travis Ellis, male    DOB: [birth year], 58 y.o.   MRN: [id number]  HPI Travis Ellis presents to the clinic today for a medication refill for his Lisinopril and Simvastatin. He is almost out of these medications. He has a hx of HTN in which he has been checking it quite regular with a reading around 118/70. He also has a hx of Dyslipidemia. He has a hx of prostate cancer in which he has completed his treatments 02/15. He has no other complaints today.   Foot drop is stable.  Review of Systems     Objective:   Physical Exam  Constitutional: He is oriented to person, place, and time. He appears well-developed and well-nourished.  HENT:  Head: Normocephalic.  Eyes: EOM are normal. Pupils are equal, round, and reactive to light.  Neck: Normal range of motion. Neck supple.  Cardiovascular: Normal rate, regular rhythm and normal heart sounds.   Pulmonary/Chest: Effort normal and breath sounds normal.  Neurological: He is alert and oriented to person, place, and time. Coordination normal.  Psychiatric: He has a normal mood and affect. His behavior is normal. Judgment and thought content normal.   Results for orders placed in visit on 02/05/14  POCT CBC      Result Value Ref Range   WBC 4.2 (*) 4.6 - 10.2 K/uL   Lymph, poc 0.9  0.6 - 3.4   POC LYMPH PERCENT 22.6  10 - 50 %L   MID (cbc) 0.4  0 - 0.9   POC MID % 9.1  0 - 12 %M   POC Granulocyte 2.9  2 - 6.9   Granulocyte percent 68.3  37 - 80 %G   RBC 4.07 (*) 4.69 - 6.13 M/uL   Hemoglobin 13.2 (*) 14.1 - 18.1 g/dL   HCT, POC 41.8 (*) 43.5 - 53.7 %   MCV 102.7 (*) 80 - 97 fL   MCH, POC 32.4 (*) 27 - 31.2 pg   MCHC 31.6 (*) 31.8 - 35.4 g/dL   RDW, POC 12.5     Platelet Count, POC 200  142 - 424 K/uL   MPV 9.3  0 - 99.8 fL  POCT UA - MICROSCOPIC ONLY      Result Value Ref Range   WBC, Ur, HPF, POC neg     RBC, urine, microscopic neg     Bacteria, U Microscopic neg     Mucus, UA neg     Epithelial cells, urine  per micros 0-1     Crystals, Ur, HPF, POC neg     Casts, Ur, LPF, POC neg     Yeast, UA neg    POCT URINALYSIS DIPSTICK      Result Value Ref Range   Color, UA yellow     Clarity, UA clear     Glucose, UA neg     Bilirubin, UA neg     Ketones, UA neg     Spec Grav, UA 1.015     Blood, UA neg     pH, UA 7.0     Protein, UA neg     Urobilinogen, UA 0.2     Nitrite, UA neg     Leukocytes, UA Negative            Assessment & Plan:  HTN/Dyslipidemia/Med rev Foot drop stable

## 2014-02-05 NOTE — Patient Instructions (Signed)
Immunization Schedule, Adult  Influenza vaccine.  All adults should be immunized every year.  All adults, including pregnant women and people with hives-only allergy to eggs can receive the inactivated influenza (IIV) vaccine.  Adults aged 60 49 years can receive the recombinant influenza (RIV) vaccine. The RIV vaccine does not contain any egg protein.  Adults aged 65 years or older can receive the standard-dose IIV or the high-dose IIV.  Tetanus, diphtheria, and acellular pertussis (Td, Tdap) vaccine.  Pregnant women should receive 1 dose of Tdap vaccine during each pregnancy. The dose should be obtained regardless of the length of time since the last dose. Immunization is preferred during the 27th to 36th week of gestation.  An adult who has not previously received Tdap or who does not know his or her vaccine status should receive 1 dose of Tdap. This initial dose should be followed by tetanus and diphtheria toxoids (Td) booster doses every 10 years.  Adults with an unknown or incomplete history of completing a 3-dose immunization series with Td-containing vaccines should begin or complete a primary immunization series including a Tdap dose.  Adults should receive a Td booster every 10 years.  Varicella vaccine.  An adult without evidence of immunity to varicella should receive 2 doses or a second dose if he or she has previously received 1 dose.  Pregnant females who do not have evidence of immunity should receive the first dose after pregnancy. This first dose should be obtained before leaving the health care facility. The second dose should be obtained 4 8 weeks after the first dose.  Human papillomavirus (HPV) vaccine.  Females aged 13 26 years who have not received the vaccine previously should obtain the 3-dose series.  The vaccine is not recommended for use in pregnant females. However, pregnancy testing is not needed before receiving a dose. If a male is found to be  pregnant after receiving a dose, no treatment is needed. In that case, the remaining doses should be delayed until after the pregnancy.  Males aged 13 21 years who have not received the vaccine previously should receive the 3-dose series. Males aged 22 26 years may be immunized.  Immunization is recommended through the age of 26 years for any male who has sex with males and did not get any or all doses earlier.  Immunization is recommended for any person with an immunocompromised condition through the age of 26 years if he or she did not get any or all doses earlier.  During the 3-dose series, the second dose should be obtained 4 8 weeks after the first dose. The third dose should be obtained 24 weeks after the first dose and 16 weeks after the second dose.  Zoster vaccine.  One dose is recommended for adults aged 60 years or older unless certain conditions are present.  Measles, mumps, and rubella (MMR) vaccine.  Adults born before 1957 generally are considered immune to measles and mumps.  Adults born in 1957 or later should have 1 or more doses of MMR vaccine unless there is a contraindication to the vaccine or there is laboratory evidence of immunity to each of the three diseases.  A routine second dose of MMR vaccine should be obtained at least 28 days after the first dose for students attending postsecondary schools, health care workers, or international travelers.  People who received inactivated measles vaccine or an unknown type of measles vaccine during 1963 1967 should receive 2 doses of MMR vaccine.  People who received   inactivated mumps vaccine or an unknown type of mumps vaccine before 1979 and are at high risk for mumps infection should consider immunization with 2 doses of MMR vaccine.  For females of childbearing age, rubella immunity should be determined. If there is no evidence of immunity, females who are not pregnant should be vaccinated. If there is no evidence of  immunity, females who are pregnant should delay immunization until after pregnancy.  Unvaccinated health care workers born before 45 who lack laboratory evidence of measles, mumps, or rubella immunity or laboratory confirmation of disease should consider measles and mumps immunization with 2 doses of MMR vaccine or rubella immunization with 1 dose of MMR vaccine.  Pneumococcal 13-valent conjugate (PCV13) vaccine.  When indicated, a person who is uncertain of his or her immunization history and has no record of immunization should receive the PCV13 vaccine.  An adult aged 38 years or older who has certain medical conditions and has not been previously immunized should receive 1 dose of PCV13 vaccine. This PCV13 should be followed with a dose of pneumococcal polysaccharide (PPSV23) vaccine. The PPSV23 vaccine dose should be obtained at least 8 weeks after the dose of PCV13 vaccine.  An adult aged 33 years or older who has certain medical conditions and previously received 1 or more doses of PPSV23 vaccine should receive 1 dose of PCV13. The PCV13 vaccine dose should be obtained 1 or more years after the last PPSV23 vaccine dose.  Pneumococcal polysaccharide (PPSV23) vaccine.  When PCV13 is also indicated, PCV13 should be obtained first.  All adults aged 71 years and older should be immunized.  An adult younger than age 74 years who has certain medical conditions should be immunized.  Any person who resides in a nursing home or long-term care facility should be immunized.  An adult smoker should be immunized.  People with an immunocompromised condition and certain other conditions should receive both PCV13 and PPSV23 vaccines.  People with human immunodeficiency virus (HIV) infection should be immunized as soon as possible after diagnosis.  Immunization during chemotherapy or radiation therapy should be avoided.  Routine use of PPSV23 vaccine is not recommended for American Indians,  [place] Natives, or people younger than 65 years unless there are medical conditions that require PPSV23 vaccine.  When indicated, people who have unknown immunization and have no record of immunization should receive PPSV23 vaccine.  One-time revaccination 5 years after the first dose of PPSV23 is recommended for people aged 22 64 years who have chronic kidney failure, nephrotic syndrome, asplenia, or immunocompromised conditions.  People who received 1 2 doses of PPSV23 before age 69 years should receive another dose of PPSV23 vaccine at age 70 years or later if at least 5 years have passed since the previous dose.  Doses of PPSV23 are not needed for people immunized with PPSV23 at or after age 66 years.  Meningococcal vaccine.  Adults with asplenia or persistent complement component deficiencies should receive 2 doses of quadrivalent meningococcal conjugate (MenACWY-D) vaccine. The doses should be obtained at least 2 months apart.  Microbiologists working with certain meningococcal bacteria, [place] recruits, people at risk during an outbreak, and people who travel to or live in countries with a high rate of meningitis should be immunized.  A first-year college student up through age 74 years who is living in a residence hall should receive a dose if he or she did not receive a dose on or after his or her 16th birthday.  Adults who have  certain high-risk conditions should receive one or more doses of vaccine.  Hepatitis A vaccine.  Adults who wish to be protected from this disease, have certain high-risk conditions, work with hepatitis A-infected animals, work in hepatitis A research labs, or travel to or work in countries with a high rate of hepatitis A should be immunized.  Adults who were previously unvaccinated and who anticipate close contact with an international adoptee during the first 60 days after arrival in the [place] from a country with a high rate of hepatitis A should  be immunized.  Hepatitis B vaccine.  Adults who wish to be protected from this disease, have certain high-risk conditions, may be exposed to blood or other infectious body fluids, are household contacts or sex partners of hepatitis B positive people, are clients or workers in certain care facilities, or travel to or work in countries with a high rate of hepatitis B should be immunized.  Haemophilus influenzae type b (Hib) vaccine.  A previously unvaccinated person with asplenia or sickle cell disease or having a scheduled splenectomy should receive 1 dose of Hib vaccine.  Regardless of previous immunization, a recipient of a hematopoietic stem cell transplant should receive a 3-dose series 6 12 months after his or her successful transplant.  Hib vaccine is not recommended for adults with HIV infection. Document Released: 11/09/2003 Document Revised: 12/14/2012 Document Reviewed: 10/06/2012 [facility] Patient Information 2014 [place], [place].

## 2014-02-07 ENCOUNTER — Encounter: Payer: Self-pay | Admitting: *Deleted

## 2014-09-28 ENCOUNTER — Emergency Department (HOSPITAL_COMMUNITY)
Admission: EM | Admit: 2014-09-28 | Discharge: 2014-09-29 | Disposition: A | Discharge status: Home / Self Care | Payer: BLUE CROSS/BLUE SHIELD | Attending: Emergency Medicine | Admitting: Emergency Medicine

## 2014-09-28 ENCOUNTER — Encounter (HOSPITAL_COMMUNITY): Payer: Self-pay | Admitting: *Deleted

## 2014-09-28 DIAGNOSIS — Z7982 Long term (current) use of aspirin: Secondary | ICD-10-CM | POA: Insufficient documentation

## 2014-09-28 DIAGNOSIS — R1030 Lower abdominal pain, unspecified: Secondary | ICD-10-CM | POA: Diagnosis not present

## 2014-09-28 DIAGNOSIS — K219 Gastro-esophageal reflux disease without esophagitis: Secondary | ICD-10-CM | POA: Diagnosis not present

## 2014-09-28 DIAGNOSIS — R739 Hyperglycemia, unspecified: Secondary | ICD-10-CM | POA: Insufficient documentation

## 2014-09-28 DIAGNOSIS — Z79899 Other long term (current) drug therapy: Secondary | ICD-10-CM | POA: Diagnosis not present

## 2014-09-28 DIAGNOSIS — Z8546 Personal history of malignant neoplasm of prostate: Secondary | ICD-10-CM | POA: Diagnosis not present

## 2014-09-28 DIAGNOSIS — R112 Nausea with vomiting, unspecified: Secondary | ICD-10-CM | POA: Diagnosis present

## 2014-09-28 DIAGNOSIS — E876 Hypokalemia: Secondary | ICD-10-CM

## 2014-09-28 DIAGNOSIS — Z8669 Personal history of other diseases of the nervous system and sense organs: Secondary | ICD-10-CM | POA: Insufficient documentation

## 2014-09-28 DIAGNOSIS — M1990 Unspecified osteoarthritis, unspecified site: Secondary | ICD-10-CM | POA: Insufficient documentation

## 2014-09-28 DIAGNOSIS — I10 Essential (primary) hypertension: Secondary | ICD-10-CM | POA: Diagnosis not present

## 2014-09-28 DIAGNOSIS — Z9089 Acquired absence of other organs: Secondary | ICD-10-CM | POA: Insufficient documentation

## 2014-09-28 MED ORDER — SODIUM CHLORIDE 0.9 % IV BOLUS (SEPSIS)
1000.0000 mL | Freq: Once | INTRAVENOUS | Status: AC
  Administered 2014-09-29: 1000 mL via INTRAVENOUS

## 2014-09-28 MED ORDER — FENTANYL CITRATE 0.05 MG/ML IJ SOLN
50.0000 ug | Freq: Once | INTRAMUSCULAR | Status: AC
  Administered 2014-09-28: 50 ug via INTRAVENOUS
  Filled 2014-09-28: qty 2

## 2014-09-28 MED ORDER — ONDANSETRON HCL 4 MG/2ML IJ SOLN
4.0000 mg | Freq: Once | INTRAMUSCULAR | Status: AC
  Administered 2014-09-28: 4 mg via INTRAVENOUS
  Filled 2014-09-28: qty 2

## 2014-09-28 NOTE — ED Notes (Signed)
Patient is alert and oriented x3.  He is complaining of nausea and vomiting that started about 9:30. Patient states that he and his wife had similar symptoms that started this past weekend.

## 2014-09-28 NOTE — ED Notes (Signed)
Bed: [facility] Expected date:  Expected time:  Means of arrival:  Comments: EMS 59 yo male abdominal pain, N/V

## 2014-09-29 ENCOUNTER — Emergency Department (HOSPITAL_COMMUNITY): Payer: BLUE CROSS/BLUE SHIELD

## 2014-09-29 ENCOUNTER — Encounter (HOSPITAL_COMMUNITY): Payer: Self-pay

## 2014-09-29 LAB — CBC WITH DIFFERENTIAL/PLATELET
BASOS PCT: 0 % (ref 0–1)
Basophils Absolute: 0 10*3/uL (ref 0.0–0.1)
EOS PCT: 0 % (ref 0–5)
Eosinophils Absolute: 0 10*3/uL (ref 0.0–0.7)
HCT: 49.2 % (ref 39.0–52.0)
Hemoglobin: 17.1 g/dL — ABNORMAL HIGH (ref 13.0–17.0)
LYMPHS PCT: 7 % — AB (ref 12–46)
Lymphs Abs: 0.8 10*3/uL (ref 0.7–4.0)
MCH: 33.5 pg (ref 26.0–34.0)
MCHC: 34.8 g/dL (ref 30.0–36.0)
MCV: 96.5 fL (ref 78.0–100.0)
Monocytes Absolute: 0.4 10*3/uL (ref 0.1–1.0)
Monocytes Relative: 4 % (ref 3–12)
Neutro Abs: 10.1 10*3/uL — ABNORMAL HIGH (ref 1.7–7.7)
Neutrophils Relative %: 89 % — ABNORMAL HIGH (ref 43–77)
PLATELETS: 238 10*3/uL (ref 150–400)
RBC: 5.1 MIL/uL (ref 4.22–5.81)
RDW: 11.5 % (ref 11.5–15.5)
WBC: 11.4 10*3/uL — AB (ref 4.0–10.5)

## 2014-09-29 LAB — COMPREHENSIVE METABOLIC PANEL
ALT: 44 U/L (ref 0–53)
AST: 32 U/L (ref 0–37)
Albumin: 4.1 g/dL (ref 3.5–5.2)
Alkaline Phosphatase: 40 U/L (ref 39–117)
Anion gap: 10 (ref 5–15)
BILIRUBIN TOTAL: 0.7 mg/dL (ref 0.3–1.2)
BUN: 27 mg/dL — AB (ref 6–23)
CALCIUM: 9 mg/dL (ref 8.4–10.5)
CO2: 28 mmol/L (ref 19–32)
Chloride: 101 mmol/L (ref 96–112)
Creatinine, Ser: 0.64 mg/dL (ref 0.50–1.35)
GLUCOSE: 189 mg/dL — AB (ref 70–99)
POTASSIUM: 2.9 mmol/L — AB (ref 3.5–5.1)
SODIUM: 139 mmol/L (ref 135–145)
Total Protein: 7.2 g/dL (ref 6.0–8.3)

## 2014-09-29 LAB — LIPASE, BLOOD: LIPASE: 25 U/L (ref 11–59)

## 2014-09-29 LAB — URINALYSIS, ROUTINE W REFLEX MICROSCOPIC
Bilirubin Urine: NEGATIVE
Glucose, UA: 500 mg/dL — AB
Hgb urine dipstick: NEGATIVE
Ketones, ur: 40 mg/dL — AB
Leukocytes, UA: NEGATIVE
Nitrite: NEGATIVE
PH: 6 (ref 5.0–8.0)
PROTEIN: NEGATIVE mg/dL
SPECIFIC GRAVITY, URINE: 1.022 (ref 1.005–1.030)
Urobilinogen, UA: 1 mg/dL (ref 0.0–1.0)

## 2014-09-29 MED ORDER — POTASSIUM CHLORIDE 10 MEQ/100ML IV SOLN
10.0000 meq | INTRAVENOUS | Status: AC
  Administered 2014-09-29 (×3): 10 meq via INTRAVENOUS
  Filled 2014-09-29 (×3): qty 100

## 2014-09-29 MED ORDER — DICYCLOMINE HCL 20 MG PO TABS: 20.0000 mg | ORAL_TABLET | Freq: Four times a day (QID) | ORAL | Status: DC | PRN

## 2014-09-29 MED ORDER — TRAMADOL HCL 50 MG PO TABS: 50.0000 mg | ORAL_TABLET | Freq: Four times a day (QID) | ORAL | Status: DC | PRN

## 2014-09-29 MED ORDER — MORPHINE SULFATE 4 MG/ML IJ SOLN
4.0000 mg | Freq: Once | INTRAMUSCULAR | Status: AC
  Administered 2014-09-29: 4 mg via INTRAVENOUS
  Filled 2014-09-29: qty 1

## 2014-09-29 MED ORDER — IOHEXOL 300 MG/ML  SOLN
100.0000 mL | Freq: Once | INTRAMUSCULAR | Status: AC | PRN
  Administered 2014-09-29: 100 mL via INTRAVENOUS

## 2014-09-29 MED ORDER — IOHEXOL 300 MG/ML  SOLN
50.0000 mL | Freq: Once | INTRAMUSCULAR | Status: AC | PRN
  Administered 2014-09-29: 50 mL via ORAL

## 2014-09-29 MED ORDER — DICYCLOMINE HCL 10 MG/ML IM SOLN
20.0000 mg | Freq: Once | INTRAMUSCULAR | Status: AC
  Administered 2014-09-29: 20 mg via INTRAMUSCULAR
  Filled 2014-09-29: qty 2

## 2014-09-29 MED ORDER — ONDANSETRON 8 MG PO TBDP: 8.0000 mg | ORAL_TABLET | Freq: Three times a day (TID) | ORAL | Status: DC | PRN

## 2014-09-29 NOTE — ED Provider Notes (Signed)
CSN: [id number]     Arrival date & time 09/28/14  2319 History   First MD Initiated Contact with Patient 09/29/14 0011     Chief Complaint  Patient presents with  . Emesis     (Consider location/radiation/quality/duration/timing/severity/associated sxs/prior Treatment) HPI 59 year old male presents to the emergency department with complaint of nausea and vomiting, upper back pain, and lower abdominal pain.  Patient reports that he had nausea and vomiting Saturday and Sunday, but has been feeling that her over the last few days.  He and his wife had taken out today, and then developed symptoms around 9:30 PM.  No fevers or chills.  No upper abdominal pain.  Patient thinks that his upper neck pain is secondary to sitting in an uncomfortable chair.  Patient has history of prostate cancer status post resection, GERD, hypertension, hyperlipidemia, chronic low back pain. Past Medical History  Diagnosis Date  . Cancer 2008    prostate  . GERD (gastroesophageal reflux disease)   . Hypertension   . Hyperlipidemia   . Arthritis   . Allergy   . Neuromuscular disorder   . Prostate cancer     [id number] adenocarcinoma of the prostate   Past Surgical History  Procedure Laterality Date  . Replacement total knee  2010    right knee  . Prostatectomy  12/2006    cancer  . Joint replacement Right 08/2009    knee   Family History  Problem Relation Age of Onset  . Breast cancer Mother   . Cancer Mother     skin  . Heart disease Father     heart attack  . COPD Father     smoker  . Heart disease Paternal Grandfather    History  Substance Use Topics  . Smoking status: Never Smoker   . Smokeless tobacco: Never Used  . Alcohol Use: Yes     Comment: rarely    Review of Systems  See History of Present Illness; otherwise all other systems are reviewed and negative   Allergies  Review of patient's allergies indicates no known allergies.  Home Medications   Prior to Admission medications    Medication Sig Start Date End Date Taking? Authorizing Provider  acetaminophen (TYLENOL) 500 MG tablet Take 1,000 mg by mouth at bedtime.    Yes Historical Provider, MD  aspirin 325 MG tablet Take 325 mg by mouth daily.     Yes Historical Provider, MD  B Complex-C (B-COMPLEX WITH VITAMIN C) tablet Take 1 tablet by mouth daily.     Yes Historical Provider, MD  cetirizine (ZYRTEC) 10 MG tablet Take 10 mg by mouth daily.     Yes Historical Provider, MD  Cholecalciferol (VITAMIN D) 2000 UNITS tablet Take 2,000 Units by mouth daily.     Yes Historical Provider, MD  Javier Docker Oil 300 MG CAPS Take 1 capsule by mouth daily.    Yes Historical Provider, MD  lisinopril-hydrochlorothiazide (PRINZIDE,ZESTORETIC) 10-12.5 MG per tablet TAKE 1 TABLET BY MOUTH DAILY. 02/05/14  Yes Orma Flaming, MD  meloxicam (MOBIC) 15 MG tablet Take 1 tablet by mouth Daily. 04/05/11  Yes Historical Provider, MD  Multiple Vitamins-Minerals (MULTIVITAMIN WITH MINERALS) tablet Take 1 tablet by mouth daily.     Yes Historical Provider, MD  niacin 100 MG tablet Take 100 mg by mouth at bedtime.    Yes Historical Provider, MD  omeprazole (PRILOSEC) 20 MG capsule Take 20 mg by mouth See admin instructions. Takes 1 capsule every 3rd day  Yes Historical Provider, MD  simvastatin (ZOCOR) 20 MG tablet TAKE 1 TABLET BY MOUTH AT BEDTIME. 02/05/14  Yes Orma Flaming, MD   BP 133/78 mmHg  Pulse 77  Temp(Src) 97.8 F (36.6 C) (Oral)  Resp 16  SpO2 96% Physical Exam  Constitutional: He is oriented to person, place, and time. He appears well-developed and well-nourished. He appears distressed (uncomfortable appearing vomiting upon arrival).  HENT:  Head: Normocephalic and atraumatic.  Nose: Nose normal.  Mouth/Throat: Oropharynx is clear and moist.  Eyes: Conjunctivae and EOM are normal. Pupils are equal, round, and reactive to light.  Neck: Normal range of motion. Neck supple. No JVD present. No tracheal deviation present. No thyromegaly  present.  Cardiovascular: Normal rate, regular rhythm, normal heart sounds and intact distal pulses.  Exam reveals no gallop and no friction rub.   No murmur heard. Pulmonary/Chest: Effort normal and breath sounds normal. No stridor. No respiratory distress. He has no wheezes. He has no rales. He exhibits no tenderness.  Abdominal: Soft. Bowel sounds are normal. He exhibits no distension and no mass. There is tenderness (tender to palpation along lower abdomen bilaterally). There is no rebound and no guarding.  Musculoskeletal: Normal range of motion. He exhibits no edema or tenderness.  Patient indicates pain across mid thoracic back.  No tenderness to palpation in this area.  Lymphadenopathy:    He has no cervical adenopathy.  Neurological: He is alert and oriented to person, place, and time. He displays normal reflexes. He exhibits normal muscle tone. Coordination normal.  Skin: Skin is warm and dry. No rash noted. No erythema. No pallor.  Psychiatric: He has a normal mood and affect. His behavior is normal. Judgment and thought content normal.  Nursing note and vitals reviewed.   ED Course  Procedures (including critical care time) Labs Review Labs Reviewed  CBC WITH DIFFERENTIAL/PLATELET - Abnormal; Notable for the following:    WBC 11.4 (*)    Hemoglobin 17.1 (*)    Neutrophils Relative % 89 (*)    Neutro Abs 10.1 (*)    Lymphocytes Relative 7 (*)    All other components within normal limits  COMPREHENSIVE METABOLIC PANEL - Abnormal; Notable for the following:    Potassium 2.9 (*)    Glucose, Bld 189 (*)    BUN 27 (*)    All other components within normal limits  URINALYSIS, ROUTINE W REFLEX MICROSCOPIC - Abnormal; Notable for the following:    APPearance CLOUDY (*)    Glucose, UA 500 (*)    Ketones, ur 40 (*)    All other components within normal limits  LIPASE, BLOOD    Imaging Review Ct Abdomen Pelvis W Contrast  09/29/2014   CLINICAL DATA:  Nausea and vomiting since  20/1 30 hours yesterday. Central abdominal pain. White cell count 11.4. History of prostate cancer post prostatectomy and radiation.  EXAM: CT ABDOMEN AND PELVIS WITH CONTRAST  TECHNIQUE: Multidetector CT imaging of the abdomen and pelvis was performed using the standard protocol following bolus administration of intravenous contrast.  CONTRAST:  136mL OMNIPAQUE IOHEXOL 300 MG/ML  SOLN  COMPARISON:  None.  FINDINGS: Atelectasis in the lung bases.  The liver, spleen, gallbladder, pancreas, adrenal glands, kidneys, abdominal aorta, inferior vena cava, and retroperitoneal lymph nodes are unremarkable. Retroaortic left renal vein. Stomach, small bowel, and colon appear normal for degree of distention. No free air or free fluid in the abdomen. Minimal fat in the umbilicus.  Pelvis: Appendix is normal. Bladder wall  is not thickened. Surgical absence of prostate gland. No pelvic mass or lymphadenopathy. Degenerative changes throughout the spine. Degenerative changes in the hips. No destructive or sclerotic bone lesions appreciated.  IMPRESSION: No acute process demonstrated in the abdomen or pelvis to account for patient's symptoms. No evidence of bowel obstruction.   Electronically Signed   By: Lucienne Capers M.D.   On: 09/29/2014 02:32     EKG Interpretation   Date/Time:  Wednesday September 28 2014 23:56:16 EST Ventricular Rate:  61 PR Interval:  185 QRS Duration: 116 QT Interval:  430 QTC Calculation: 433 R Axis:   19 Text Interpretation:  Sinus rhythm Incomplete right bundle branch block No  significant change since last tracing Confirmed by Christyna Letendre  MD, Epifania Littrell ([id number])  on 09/29/2014 2:13:18 AM      MDM   Final diagnoses:  Hypokalemia  Hyperglycemia  Lower abdominal pain  Non-intractable vomiting with nausea, vomiting of unspecified type    59 year old male with nausea and vomiting over the weekend and return of nausea and vomiting with abdominal pain tonight.  Plan for labs, CT abdomen pelvis  for possible diverticulitis.  Patient noted to have glucose in his urine and slightly elevated blood glucose, will have him follow-up with his primary care doctor to address this.    Kalman Drape, MD 09/29/14 [phone]

## 2014-09-29 NOTE — Discharge Instructions (Signed)
Abdominal Pain Many things can cause abdominal pain. Usually, abdominal pain is not caused by a disease and will improve without treatment. It can often be observed and treated at home. Your health care provider will do a physical exam and possibly order blood tests and X-rays to help determine the seriousness of your pain. However, in many cases, more time must pass before a clear cause of the pain can be found. Before that point, your health care provider may not know if you need more testing or further treatment. HOME CARE INSTRUCTIONS  Monitor your abdominal pain for any changes. The following actions may help to alleviate any discomfort you are experiencing:  Only take over-the-counter or prescription medicines as directed by your health care provider.  Do not take laxatives unless directed to do so by your health care provider.  Try a clear liquid diet (broth, tea, or water) as directed by your health care provider. Slowly move to a bland diet as tolerated. SEEK MEDICAL CARE IF:  You have unexplained abdominal pain.  You have abdominal pain associated with nausea or diarrhea.  You have pain when you urinate or have a bowel movement.  You experience abdominal pain that wakes you in the night.  You have abdominal pain that is worsened or improved by eating food.  You have abdominal pain that is worsened with eating fatty foods.  You have a fever. SEEK IMMEDIATE MEDICAL CARE IF:   Your pain does not go away within 2 hours.  You keep throwing up (vomiting).  Your pain is felt only in portions of the abdomen, such as the right side or the left lower portion of the abdomen.  You pass bloody or black tarry stools. MAKE SURE YOU:  Understand these instructions.   Will watch your condition.   Will get help right away if you are not doing well or get worse.  Document Released: 05/29/2005 Document Revised: 08/24/2013 Document Reviewed: 04/28/2013 [facility] Patient Information  2015 [place], [place]. This information is not intended to replace advice given to you by your health care provider. Make sure you discuss any questions you have with your health care provider.  High Blood Sugar High blood sugar (hyperglycemia) means that the level of sugar in your blood is higher than it should be. Signs of high blood sugar include:  Feeling thirsty.  Frequent peeing (urinating).  Feeling tired or sleepy.  Dry mouth.  Vision changes.  Feeling weak.  Feeling hungry but losing weight.  Numbness and tingling in your hands or feet.  Headache. When you ignore these signs, your blood sugar may keep going up. These problems may get worse, and other problems may begin. HOME CARE  Check your blood sugars as told by your doctor. Write down the numbers with the date and time.  Take the right amount of insulin or diabetes pills at the right time. Write down the dose with date and time.  Refill your insulin or diabetes pills before running out.  Watch what you eat. Follow your meal plan.  Drink liquids without sugar, such as water. Check with your doctor if you have kidney or heart disease.  Follow your doctor's orders for exercise. Exercise at the same time of day.  Keep your doctor's appointments. GET HELP RIGHT AWAY IF:   You have trouble thinking or are confused.  You have fast breathing with fruity smelling breath.  You pass out (faint).  You have 2 to 3 days of high blood sugars and you  do not know why.  You have chest pain.  You are feeling sick to your stomach (nauseous) or throwing up (vomiting).  You have sudden vision changes. MAKE SURE YOU:   Understand these instructions.  Will watch your condition.  Will get help right away if you are not doing well or get worse. Document Released: 06/16/2009 Document Revised: 11/11/2011 Document Reviewed: 06/16/2009 [facility] Patient Information 2015 [place], [place]. This information is not intended to  replace advice given to you by your health care provider. Make sure you discuss any questions you have with your health care provider.  Hypokalemia Hypokalemia means that the amount of potassium in the blood is lower than normal.Potassium is a chemical, called an electrolyte, that helps regulate the amount of fluid in the body. It also stimulates muscle contraction and helps nerves function properly.Most of the body's potassium is inside of cells, and only a very small amount is in the blood. Because the amount in the blood is so small, minor changes can be life-threatening. CAUSES  Antibiotics.  Diarrhea or vomiting.  Using laxatives too much, which can cause diarrhea.  Chronic kidney disease.  Water pills (diuretics).  Eating disorders (bulimia).  Low magnesium level.  Sweating a lot. SIGNS AND SYMPTOMS  Weakness.  Constipation.  Fatigue.  Muscle cramps.  Mental confusion.  Skipped heartbeats or irregular heartbeat (palpitations).  Tingling or numbness. DIAGNOSIS  Your health care provider can diagnose hypokalemia with blood tests. In addition to checking your potassium level, your health care provider may also check other lab tests. TREATMENT Hypokalemia can be treated with potassium supplements taken by mouth or adjustments in your current medicines. If your potassium level is very low, you may need to get potassium through a vein (IV) and be monitored in the hospital. A diet high in potassium is also helpful. Foods high in potassium are:  Nuts, such as peanuts and pistachios.  Seeds, such as sunflower seeds and pumpkin seeds.  Peas, lentils, and lima beans.  Whole grain and bran cereals and breads.  Fresh fruit and vegetables, such as apricots, avocado, bananas, cantaloupe, kiwi, oranges, tomatoes, asparagus, and potatoes.  Orange and tomato juices.  Red meats.  Fruit yogurt. HOME CARE INSTRUCTIONS  Take all medicines as prescribed by your health care  provider.  Maintain a healthy diet by including nutritious food, such as fruits, vegetables, nuts, whole grains, and lean meats.  If you are taking a laxative, be sure to follow the directions on the label. SEEK MEDICAL CARE IF:  Your weakness gets worse.  You feel your heart pounding or racing.  You are vomiting or having diarrhea.  You are diabetic and having trouble keeping your blood glucose in the normal range. SEEK IMMEDIATE MEDICAL CARE IF:  You have chest pain, shortness of breath, or dizziness.  You are vomiting or having diarrhea for more than 2 days.  You faint. MAKE SURE YOU:   Understand these instructions.  Will watch your condition.  Will get help right away if you are not doing well or get worse. Document Released: 08/19/2005 Document Revised: 06/09/2013 Document Reviewed: 02/19/2013 [facility] Patient Information 2015 [place], [place]. This information is not intended to replace advice given to you by your health care provider. Make sure you discuss any questions you have with your health care provider.  Nausea and Vomiting Nausea is a sick feeling that often comes before throwing up (vomiting). Vomiting is a reflex where stomach contents come out of your mouth. Vomiting can cause severe  loss of body fluids (dehydration). Children and elderly adults can become dehydrated quickly, especially if they also have diarrhea. Nausea and vomiting are symptoms of a condition or disease. It is important to find the cause of your symptoms. CAUSES   Direct irritation of the stomach lining. This irritation can result from increased acid production (gastroesophageal reflux disease), infection, food poisoning, taking certain medicines (such as nonsteroidal anti-inflammatory drugs), alcohol use, or tobacco use.  Signals from the brain.These signals could be caused by a headache, heat exposure, an inner ear disturbance, increased pressure in the brain from injury, infection, a  tumor, or a concussion, pain, emotional stimulus, or metabolic problems.  An obstruction in the gastrointestinal tract (bowel obstruction).  Illnesses such as diabetes, hepatitis, gallbladder problems, appendicitis, kidney problems, cancer, sepsis, atypical symptoms of a heart attack, or eating disorders.  Medical treatments such as chemotherapy and radiation.  Receiving medicine that makes you sleep (general anesthetic) during surgery. DIAGNOSIS Your caregiver may ask for tests to be done if the problems do not improve after a few days. Tests may also be done if symptoms are severe or if the reason for the nausea and vomiting is not clear. Tests may include:  Urine tests.  Blood tests.  Stool tests.  Cultures (to look for evidence of infection).  X-rays or other imaging studies. Test results can help your caregiver make decisions about treatment or the need for additional tests. TREATMENT You need to stay well hydrated. Drink frequently but in small amounts.You may wish to drink water, sports drinks, clear broth, or eat frozen ice pops or gelatin dessert to help stay hydrated.When you eat, eating slowly may help prevent nausea.There are also some antinausea medicines that may help prevent nausea. HOME CARE INSTRUCTIONS   Take all medicine as directed by your caregiver.  If you do not have an appetite, do not force yourself to eat. However, you must continue to drink fluids.  If you have an appetite, eat a normal diet unless your caregiver tells you differently.  Eat a variety of complex carbohydrates (rice, wheat, potatoes, bread), lean meats, yogurt, fruits, and vegetables.  Avoid high-fat foods because they are more difficult to digest.  Drink enough water and fluids to keep your urine clear or pale yellow.  If you are dehydrated, ask your caregiver for specific rehydration instructions. Signs of dehydration may include:  Severe thirst.  Dry lips and  mouth.  Dizziness.  Dark urine.  Decreasing urine frequency and amount.  Confusion.  Rapid breathing or pulse. SEEK IMMEDIATE MEDICAL CARE IF:   You have blood or brown flecks (like coffee grounds) in your vomit.  You have black or bloody stools.  You have a severe headache or stiff neck.  You are confused.  You have severe abdominal pain.  You have chest pain or trouble breathing.  You do not urinate at least once every 8 hours.  You develop cold or clammy skin.  You continue to vomit for longer than 24 to 48 hours.  You have a fever. MAKE SURE YOU:   Understand these instructions.  Will watch your condition.  Will get help right away if you are not doing well or get worse. Document Released: 08/19/2005 Document Revised: 11/11/2011 Document Reviewed: 01/16/2011 [facility] Patient Information 2015 [place], [place]. This information is not intended to replace advice given to you by your health care provider. Make sure you discuss any questions you have with your health care provider.  Potassium Content of Foods Potassium  is a mineral found in many foods and drinks. It helps keep fluids and minerals balanced in your body and affects how steadily your heart beats. Potassium also helps control your blood pressure and keep your muscles and nervous system healthy. Certain health conditions and medicines may change the balance of potassium in your body. When this happens, you can help balance your level of potassium through the foods that you do or do not eat. Your health care provider or dietitian may recommend an amount of potassium that you should have each day. The following lists of foods provide the amount of potassium (in parentheses) per serving in each item. HIGH IN POTASSIUM  The following foods and beverages have 200 mg or more of potassium per serving:  Apricots, 2 raw or 5 dry (200 mg).  Artichoke, 1 medium (345 mg).  Avocado, raw,  each (245 mg).  Banana, 1  medium (425 mg).  Beans, lima, or baked beans, canned,  cup (280 mg).  Beans, white, canned,  cup (595 mg).  Beef roast, 3 oz (320 mg).  Beef, ground, 3 oz (270 mg).  Beets, raw or cooked,  cup (260 mg).  Bran muffin, 2 oz (300 mg).  Broccoli,  cup (230 mg).  Brussels sprouts,  cup (250 mg).  Cantaloupe,  cup (215 mg).  Cereal, 100% bran,  cup (200-400 mg).  Cheeseburger, single, fast food, 1 each (225-400 mg).  Chicken, 3 oz (220 mg).  Clams, canned, 3 oz (535 mg).  Crab, 3 oz (225 mg).  Dates, 5 each (270 mg).  Dried beans and peas,  cup (300-475 mg).  Figs, dried, 2 each (260 mg).  Fish: halibut, tuna, cod, snapper, 3 oz (480 mg).  Fish: salmon, haddock, swordfish, perch, 3 oz (300 mg).  Fish, tuna, canned 3 oz (200 mg).  [place] fries, fast food, 3 oz (470 mg).  Granola with fruit and nuts,  cup (200 mg).  Grapefruit juice,  cup (200 mg).  Greens, beet,  cup (655 mg).  Honeydew melon,  cup (200 mg).  Kale, raw, 1 cup (300 mg).  Kiwi, 1 medium (240 mg).  Kohlrabi, rutabaga, parsnips,  cup (280 mg).  Lentils,  cup (365 mg).  Mango, 1 each (325 mg).  Milk, chocolate, 1 cup (420 mg).  Milk: nonfat, low-fat, whole, buttermilk, 1 cup (350-380 mg).  Molasses, 1 Tbsp (295 mg).  Mushrooms,  cup (280) mg.  Nectarine, 1 each (275 mg).  Nuts: almonds, peanuts, hazelnuts, [place], cashew, mixed, 1 oz (200 mg).  Nuts, pistachios, 1 oz (295 mg).  Orange, 1 each (240 mg).  Orange juice,  cup (235 mg).  Papaya, medium,  fruit (390 mg).  Peanut butter, chunky, 2 Tbsp (240 mg).  Peanut butter, smooth, 2 Tbsp (210 mg).  Pear, 1 medium (200 mg).  Pomegranate, 1 whole (400 mg).  Pomegranate juice,  cup (215 mg).  Pork, 3 oz (350 mg).  Potato chips, salted, 1 oz (465 mg).  Potato, baked with skin, 1 medium (925 mg).  Potatoes, boiled,  cup (255 mg).  Potatoes, mashed,  cup (330 mg).  Prune juice,  cup (370  mg).  Prunes, 5 each (305 mg).  Pudding, chocolate,  cup (230 mg).  Pumpkin, canned,  cup (250 mg).  Raisins, seedless,  cup (270 mg).  Seeds, sunflower or pumpkin, 1 oz (240 mg).  Soy milk, 1 cup (300 mg).  Spinach,  cup (420 mg).  Spinach, canned,  cup (370 mg).  Sweet potato, baked with  skin, 1 medium (450 mg).  Swiss chard,  cup (480 mg).  Tomato or vegetable juice,  cup (275 mg).  Tomato sauce or puree,  cup (400-550 mg).  Tomato, raw, 1 medium (290 mg).  Tomatoes, canned,  cup (200-300 mg).  [place], 3 oz (250 mg).  Wheat germ, 1 oz (250 mg).  Winter squash,  cup (250 mg).  Yogurt, plain or fruited, 6 oz (260-435 mg).  Zucchini,  cup (220 mg). MODERATE IN POTASSIUM The following foods and beverages have 50-200 mg of potassium per serving:  Apple, 1 each (150 mg).  Apple juice,  cup (150 mg).  Applesauce,  cup (90 mg).  Apricot nectar,  cup (140 mg).  Asparagus, small spears,  cup or 6 spears (155 mg).  Bagel, cinnamon raisin, 1 each (130 mg).  Bagel, egg or plain, 4 in., 1 each (70 mg).  Beans, green,  cup (90 mg).  Beans, yellow,  cup (190 mg).  Beer, regular, 12 oz (100 mg).  Beets, canned,  cup (125 mg).  Blackberries,  cup (115 mg).  Blueberries,  cup (60 mg).  Bread, whole wheat, 1 slice (70 mg).  Broccoli, raw,  cup (145 mg).  Cabbage,  cup (150 mg).  Carrots, cooked or raw,  cup (180 mg).  Cauliflower, raw,  cup (150 mg).  Celery, raw,  cup (155 mg).  Cereal, bran flakes, cup (120-150 mg).  Cheese, cottage,  cup (110 mg).  Cherries, 10 each (150 mg).  Chocolate, 1 oz bar (165 mg).  Coffee, brewed 6 oz (90 mg).  Corn,  cup or 1 ear (195 mg).  Cucumbers,  cup (80 mg).  Egg, large, 1 each (60 mg).  Eggplant,  cup (60 mg).  Endive, raw, cup (80 mg).  English muffin, 1 each (65 mg).  Fish, orange roughy, 3 oz (150 mg).  Frankfurter, beef or pork, 1 each (75 mg).  Fruit  cocktail,  cup (115 mg).  Grape juice,  cup (170 mg).  Grapefruit,  fruit (175 mg).  Grapes,  cup (155 mg).  Greens: kale, turnip, collard,  cup (110-150 mg).  Ice cream or frozen yogurt, chocolate,  cup (175 mg).  Ice cream or frozen yogurt, vanilla,  cup (120-150 mg).  Lemons, limes, 1 each (80 mg).  Lettuce, all types, 1 cup (100 mg).  Mixed vegetables,  cup (150 mg).  Mushrooms, raw,  cup (110 mg).  Nuts: walnuts, pecans, or macadamia, 1 oz (125 mg).  Oatmeal,  cup (80 mg).  Okra,  cup (110 mg).  Onions, raw,  cup (120 mg).  Peach, 1 each (185 mg).  Peaches, canned,  cup (120 mg).  Pears, canned,  cup (120 mg).  Peas, green, frozen,  cup (90 mg).  Peppers, green,  cup (130 mg).  Peppers, red,  cup (160 mg).  Pineapple juice,  cup (165 mg).  Pineapple, fresh or canned,  cup (100 mg).  Plums, 1 each (105 mg).  Pudding, vanilla,  cup (150 mg).  Raspberries,  cup (90 mg).  Rhubarb,  cup (115 mg).  Rice, wild,  cup (80 mg).  Shrimp, 3 oz (155 mg).  Spinach, raw, 1 cup (170 mg).  Strawberries,  cup (125 mg).  Summer squash  cup (175-200 mg).  Swiss chard, raw, 1 cup (135 mg).  Tangerines, 1 each (140 mg).  Tea, brewed, 6 oz (65 mg).  Turnips,  cup (140 mg).  Watermelon,  cup (85 mg).  Wine, red, table, 5 oz (180 mg).  Wine, white, table, 5 oz (100 mg). LOW IN POTASSIUM The following foods and beverages have less than 50 mg of potassium per serving.  Bread, white, 1 slice (30 mg).  Carbonated beverages, 12 oz (less than 5 mg).  Cheese, 1 oz (20-30 mg).  Cranberries,  cup (45 mg).  Cranberry juice cocktail,  cup (20 mg).  Fats and oils, 1 Tbsp (less than 5 mg).  Hummus, 1 Tbsp (32 mg).  Nectar: papaya, mango, or pear,  cup (35 mg).  Rice, white or brown,  cup (50 mg).  Spaghetti or macaroni,  cup cooked (30 mg).  Tortilla, flour or corn, 1 each (50 mg).  Waffle, 4 in., 1 each (50  mg).  Water chestnuts,  cup (40 mg). Document Released: 04/02/2005 Document Revised: 08/24/2013 Document Reviewed: 07/16/2013 [facility] Patient Information 2015 [place], [place]. This information is not intended to replace advice given to you by your health care provider. Make sure you discuss any questions you have with your health care provider.

## 2014-10-06 ENCOUNTER — Ambulatory Visit (INDEPENDENT_AMBULATORY_CARE_PROVIDER_SITE_OTHER): Payer: BLUE CROSS/BLUE SHIELD | Admitting: Family Medicine

## 2014-10-06 VITALS — BP 118/62 | HR 93 | Temp 98.1°F | Resp 18 | Ht 72.75 in | Wt 210.0 lb

## 2014-10-06 DIAGNOSIS — Z09 Encounter for follow-up examination after completed treatment for conditions other than malignant neoplasm: Secondary | ICD-10-CM

## 2014-10-06 DIAGNOSIS — D72829 Elevated white blood cell count, unspecified: Secondary | ICD-10-CM

## 2014-10-06 DIAGNOSIS — D72820 Lymphocytosis (symptomatic): Secondary | ICD-10-CM

## 2014-10-06 LAB — POCT CBC
Granulocyte percent: 86.4 %G — AB (ref 37–80)
HEMATOCRIT: 49.1 % (ref 43.5–53.7)
HEMOGLOBIN: 16.2 g/dL (ref 14.1–18.1)
Lymph, poc: 7.8 — AB (ref 0.6–3.4)
MCH: 32.6 pg — AB (ref 27–31.2)
MCHC: 32.9 g/dL (ref 31.8–35.4)
MCV: 99.2 fL — AB (ref 80–97)
MID (cbc): 5.8 — AB (ref 0–0.9)
MPV: 7.3 fL (ref 0–99.8)
PLATELET COUNT, POC: 385 10*3/uL (ref 142–424)
POC GRANULOCYTE: 15.7 — AB (ref 2–6.9)
POC LYMPH PERCENT: 1.4 %L — AB (ref 10–50)
POC MID %: 1.1 % (ref 0–12)
RBC: 4.95 M/uL (ref 4.69–6.13)
RDW, POC: 11.8 %
WBC: 18.2 10*3/uL — AB (ref 4.6–10.2)

## 2014-10-06 LAB — POCT URINALYSIS DIPSTICK
Bilirubin, UA: NEGATIVE
Glucose, UA: NEGATIVE
LEUKOCYTES UA: NEGATIVE
NITRITE UA: NEGATIVE
PH UA: 6
Protein, UA: NEGATIVE
SPEC GRAV UA: 1.025
UROBILINOGEN UA: 1

## 2014-10-06 LAB — POCT UA - MICROSCOPIC ONLY
BACTERIA, U MICROSCOPIC: NEGATIVE
CRYSTALS, UR, HPF, POC: NEGATIVE
Casts, Ur, LPF, POC: NEGATIVE
EPITHELIAL CELLS, URINE PER MICROSCOPY: NEGATIVE
Mucus, UA: NEGATIVE
YEAST UA: NEGATIVE

## 2014-10-06 LAB — GLUCOSE, POCT (MANUAL RESULT ENTRY): POC GLUCOSE: 165 mg/dL — AB (ref 70–99)

## 2014-10-06 LAB — POCT GLYCOSYLATED HEMOGLOBIN (HGB A1C): Hemoglobin A1C: 5.1

## 2014-10-06 NOTE — Progress Notes (Signed)
10/06/2014 at 7:39 PM  Travis Ellis / DOB: Oct 26, 1955 / MRN: [id number]  The patient has HTN (hypertension); Dyslipidemia; Neuropathy, leg; Abnormal gait; and Prostate cancer on his problem list.  SUBJECTIVE  Chief compalaint: Follow-up   History of present illness: Travis Ellis is 59 y.o. well appearing male presenting for a emergency room visit followup. Per the chart, the patient presented with nausea and vomiting, and further testing revealed leukocytosis with left shift, and a hypokalemia.  There was concern for diverticulitis, however abdominal and pelvic CT was negative for any abnormalities. He was given roughly 1 liter of potassium chloride 10 mEq in 100 mL He was placed on zofran, bentyl, and tramadol. The ED did ask that he follow up for an elevated blood glucose of 189 found on CMET.   Per the patient's report, he felt like he was suffering from food poisoning and he feels better in this regard.  He denies nausea, emesis, and abdominal pain, hematochezia and melena today.  He has had a decreased appetite, and has a difficult time quantifying his fluid consumption.  He denies decreases in urination and does not feel thirst.    His last colonoscopy was 07/05/11 and was negative per patient.  He has a history of prostate cancer s/p radical prostectomy, and is followed closely biannually for this. Per the chart his last dose of radiation was around 11/25/13.     He  has a past medical history of Cancer (2008); GERD (gastroesophageal reflux disease); Hypertension; Hyperlipidemia; Arthritis; Allergy; Neuromuscular disorder; and Prostate cancer.    He has a current medication list which includes the following prescription(s): acetaminophen, aspirin, b-complex with vitamin c, cetirizine, vitamin d, krill oil, lisinopril-hydrochlorothiazide, meloxicam, multivitamin with minerals, niacin, omeprazole, simvastatin, dicyclomine, ondansetron, and tramadol.  Travis Ellis has No Known Allergies. He   reports that he has never smoked. He has never used smokeless tobacco. He reports that he drinks alcohol. He reports that he does not use illicit drugs. He  reports that he currently engages in sexual activity.  The patient  has past surgical history that includes Replacement total knee (2010); Prostatectomy (12/2006); and Joint replacement (Right, 08/2009).  His family history includes Breast cancer in his mother; COPD in his father; Cancer in his mother; Heart disease in his father and paternal grandfather.  Review of Systems  Constitutional: Negative for fever, chills and weight loss.  HENT: Negative for congestion.   Skin: Positive for rash.  Neurological: Negative for headaches.    OBJECTIVE  His  height is 6' 0.75" (1.848 m) and weight is 210 lb (95.255 kg). His oral temperature is 98.1 F (36.7 C). His blood pressure is 118/62 and his pulse is 93. His respiration is 18 and oxygen saturation is 98%.  The patient's body mass index is 27.89 kg/(m^2).  Orthostatic VS for the past 24 hrs:  BP- Lying Pulse- Lying BP- Sitting Pulse- Sitting BP- Standing at 0 minutes Pulse- Standing at 0 minutes  10/06/14 1858 114/72 mmHg 86 107/71 mmHg 94 109/73 mmHg 101   Physical Exam  Constitutional: He appears well-developed and well-nourished. No distress.  HENT:  Head: Normocephalic.  Cardiovascular: Normal rate.   Skin: He is not diaphoretic.    Results for orders placed or performed in visit on 10/06/14 (from the past 24 hour(s))  POCT urinalysis dipstick     Status: Normal   Collection Time: 10/06/14  7:19 PM  Result Value Ref Range   Color, UA Dk yellow  Clarity, UA clear    Glucose, UA neg    Bilirubin, UA neg    Ketones, UA trace    Spec Grav, UA 1.025    Blood, UA trace intact    pH, UA 6.0    Protein, UA neg    Urobilinogen, UA 1.0    Nitrite, UA neg    Leukocytes, UA Negative   POCT UA - Microscopic Only     Status: Normal   Collection Time: 10/06/14  7:19 PM  Result  Value Ref Range   WBC, Ur, HPF, POC 0-1    RBC, urine, microscopic 0-2    Bacteria, U Microscopic neg    Mucus, UA neg    Epithelial cells, urine per micros neg    Crystals, Ur, HPF, POC neg    Casts, Ur, LPF, POC neg    Yeast, UA neg   POCT glucose (manual entry)     Status: Abnormal   Collection Time: 10/06/14  7:19 PM  Result Value Ref Range   POC Glucose 165 (A) 70 - 99 mg/dl  POCT glycosylated hemoglobin (Hb A1C)     Status: Normal   Collection Time: 10/06/14  7:19 PM  Result Value Ref Range   Hemoglobin A1C 5.1   POCT CBC     Status: Abnormal   Collection Time: 10/06/14  7:19 PM  Result Value Ref Range   WBC 18.2 (A) 4.6 - 10.2 K/uL   Lymph, poc 7.8 (A) 0.6 - 3.4   POC LYMPH PERCENT 1.4 (A) 10 - 50 %L   MID (cbc) 5.8 (A) 0 - 0.9   POC MID % 1.1 0 - 12 %M   POC Granulocyte 15.7 (A) 2 - 6.9   Granulocyte percent 86.4 (A) 37 - 80 %G   RBC 4.95 4.69 - 6.13 M/uL   Hemoglobin 16.2 14.1 - 18.1 g/dL   HCT, POC 49.1 43.5 - 53.7 %   MCV 99.2 (A) 80 - 97 fL   MCH, POC 32.6 (A) 27 - 31.2 pg   MCHC 32.9 31.8 - 35.4 g/dL   RDW, POC 11.8 %   Platelet Count, POC 385 142 - 424 K/uL   MPV 7.3 0 - 99.8 fL    ASSESSMENT & PLAN  Travis Ellis was seen today for follow-up.  Diagnoses and associated orders for this visit:  Hospital discharge follow-up:  White count remains elevated and inconclusive. Given the patient the patient is feeling better and his symptoms are resolving, will order a future CBC with diff.  - POCT urinalysis dipstick - POCT UA - Microscopic Only - POCT glucose (manual entry) - POCT glycosylated hemoglobin (Hb A1C) - POCT CBC - Comprehensive metabolic panel -     Future CBC with Diff in 7 days   Lymphocytosis/Leukocytosis -     Will send smear to pathology for review.    The patient was instructed to to call or comeback to clinic as needed, or should symptoms warrant.  Philis Fendt, MHS, PA-C [facility] 10/06/2014  7:39 PM

## 2014-10-07 LAB — COMPREHENSIVE METABOLIC PANEL
ALT: 32 U/L (ref 0–53)
AST: 22 U/L (ref 0–37)
Albumin: 3.9 g/dL (ref 3.5–5.2)
Alkaline Phosphatase: 59 U/L (ref 39–117)
BUN: 17 mg/dL (ref 6–23)
CALCIUM: 9.4 mg/dL (ref 8.4–10.5)
CHLORIDE: 95 meq/L — AB (ref 96–112)
CO2: 27 mEq/L (ref 19–32)
CREATININE: 0.79 mg/dL (ref 0.50–1.35)
GLUCOSE: 147 mg/dL — AB (ref 70–99)
Potassium: 3.7 mEq/L (ref 3.5–5.3)
SODIUM: 138 meq/L (ref 135–145)
Total Bilirubin: 0.6 mg/dL (ref 0.2–1.2)
Total Protein: 7.4 g/dL (ref 6.0–8.3)

## 2014-10-10 LAB — PATHOLOGIST SMEAR REVIEW

## 2014-10-13 ENCOUNTER — Other Ambulatory Visit (INDEPENDENT_AMBULATORY_CARE_PROVIDER_SITE_OTHER): Payer: BLUE CROSS/BLUE SHIELD

## 2014-10-13 DIAGNOSIS — D72820 Lymphocytosis (symptomatic): Secondary | ICD-10-CM

## 2014-10-13 DIAGNOSIS — D72829 Elevated white blood cell count, unspecified: Secondary | ICD-10-CM

## 2014-10-13 LAB — POCT CBC
Granulocyte percent: 73.9 %G (ref 37–80)
HCT, POC: 46 % (ref 43.5–53.7)
Hemoglobin: 15.3 g/dL (ref 14.1–18.1)
LYMPH, POC: 1.6 (ref 0.6–3.4)
MCH: 33 pg — AB (ref 27–31.2)
MCHC: 33.2 g/dL (ref 31.8–35.4)
MCV: 99.3 fL — AB (ref 80–97)
MID (CBC): 0.6 (ref 0–0.9)
MPV: 7.3 fL (ref 0–99.8)
PLATELET COUNT, POC: 434 10*3/uL — AB (ref 142–424)
POC Granulocyte: 6.1 (ref 2–6.9)
POC LYMPH PERCENT: 19.1 %L (ref 10–50)
POC MID %: 7 % (ref 0–12)
RBC: 4.63 M/uL — AB (ref 4.69–6.13)
RDW, POC: 11.8 %
WBC: 8.3 10*3/uL (ref 4.6–10.2)

## 2015-02-01 ENCOUNTER — Ambulatory Visit (INDEPENDENT_AMBULATORY_CARE_PROVIDER_SITE_OTHER): Payer: BLUE CROSS/BLUE SHIELD | Admitting: Internal Medicine

## 2015-02-01 VITALS — BP 124/82 | HR 90 | Temp 97.9°F | Resp 18 | Ht 72.75 in | Wt 214.2 lb

## 2015-02-01 DIAGNOSIS — J039 Acute tonsillitis, unspecified: Secondary | ICD-10-CM | POA: Diagnosis not present

## 2015-02-01 DIAGNOSIS — Z8546 Personal history of malignant neoplasm of prostate: Secondary | ICD-10-CM

## 2015-02-01 DIAGNOSIS — E785 Hyperlipidemia, unspecified: Secondary | ICD-10-CM | POA: Diagnosis not present

## 2015-02-01 DIAGNOSIS — Z7189 Other specified counseling: Secondary | ICD-10-CM

## 2015-02-01 DIAGNOSIS — G5791 Unspecified mononeuropathy of right lower limb: Secondary | ICD-10-CM | POA: Diagnosis not present

## 2015-02-01 DIAGNOSIS — I10 Essential (primary) hypertension: Secondary | ICD-10-CM | POA: Diagnosis not present

## 2015-02-01 DIAGNOSIS — Z79899 Other long term (current) drug therapy: Secondary | ICD-10-CM | POA: Diagnosis not present

## 2015-02-01 LAB — LIPID PANEL
CHOL/HDL RATIO: 3.3 ratio
CHOLESTEROL: 140 mg/dL (ref 0–200)
HDL: 42 mg/dL (ref >=40)
LDL Cholesterol: 78 mg/dL (ref 0–99)
Triglycerides: 100 mg/dL (ref <150)
VLDL: 20 mg/dL (ref 0–40)

## 2015-02-01 LAB — POCT CBC
Granulocyte percent: 84.1 %G — AB (ref 37–80)
HCT, POC: 49 % (ref 43.5–53.7)
HEMOGLOBIN: 16.6 g/dL (ref 14.1–18.1)
Lymph, poc: 1 (ref 0.6–3.4)
MCH, POC: 32.8 pg — AB (ref 27–31.2)
MCHC: 33.9 g/dL (ref 31.8–35.4)
MCV: 96.8 fL (ref 80–97)
MID (CBC): 0.8 (ref 0–0.9)
MPV: 7.2 fL (ref 0–99.8)
POC GRANULOCYTE: 9.8 — AB (ref 2–6.9)
POC LYMPH %: 8.9 % — AB (ref 10–50)
POC MID %: 7 % (ref 0–12)
Platelet Count, POC: 253 10*3/uL (ref 142–424)
RBC: 5.06 M/uL (ref 4.69–6.13)
RDW, POC: 12.7 %
WBC: 11.6 10*3/uL — AB (ref 4.6–10.2)

## 2015-02-01 LAB — BASIC METABOLIC PANEL
BUN: 28 mg/dL — ABNORMAL HIGH (ref 6–23)
CALCIUM: 9.6 mg/dL (ref 8.4–10.5)
CHLORIDE: 101 meq/L (ref 96–112)
CO2: 26 meq/L (ref 19–32)
CREATININE: 0.61 mg/dL (ref 0.50–1.35)
Glucose, Bld: 107 mg/dL — ABNORMAL HIGH (ref 70–99)
Potassium: 3.8 mEq/L (ref 3.5–5.3)
Sodium: 137 mEq/L (ref 135–145)

## 2015-02-01 LAB — POCT RAPID STREP A (OFFICE): Rapid Strep A Screen: NEGATIVE

## 2015-02-01 LAB — GLUCOSE, POCT (MANUAL RESULT ENTRY): POC GLUCOSE: 115 mg/dL — AB (ref 70–99)

## 2015-02-01 MED ORDER — AZITHROMYCIN 500 MG PO TABS: 500.0000 mg | ORAL_TABLET | Freq: Every day | ORAL | Status: DC

## 2015-02-01 MED ORDER — LISINOPRIL-HYDROCHLOROTHIAZIDE 10-12.5 MG PO TABS: ORAL_TABLET | ORAL | Status: DC

## 2015-02-01 MED ORDER — SIMVASTATIN 20 MG PO TABS: ORAL_TABLET | ORAL | Status: DC

## 2015-02-01 NOTE — Patient Instructions (Signed)
DASH Eating Plan DASH stands for "Dietary Approaches to Stop Hypertension." The DASH eating plan is a healthy eating plan that has been shown to reduce high blood pressure (hypertension). Additional health benefits may include reducing the risk of type 2 diabetes mellitus, heart disease, and stroke. The DASH eating plan may also help with weight loss. WHAT DO I NEED TO KNOW ABOUT THE DASH EATING PLAN? For the DASH eating plan, you will follow these general guidelines:  Choose foods with a percent daily value for sodium of less than 5% (as listed on the food label).  Use salt-free seasonings or herbs instead of table salt or sea salt.  Check with your health care provider or pharmacist before using salt substitutes.  Eat lower-sodium products, often labeled as "lower sodium" or "no salt added."  Eat fresh foods.  Eat more vegetables, fruits, and low-fat dairy products.  Choose whole grains. Look for the word "whole" as the first word in the ingredient list.  Choose fish and skinless chicken or turkey more often than red meat. Limit fish, poultry, and meat to 6 oz (170 g) each day.  Limit sweets, desserts, sugars, and sugary drinks.  Choose heart-healthy fats.  Limit cheese to 1 oz (28 g) per day.  Eat more home-cooked food and less restaurant, buffet, and fast food.  Limit fried foods.  Cook foods using methods other than frying.  Limit canned vegetables. If you do use them, rinse them well to decrease the sodium.  When eating at a restaurant, ask that your food be prepared with less salt, or no salt if possible. WHAT FOODS CAN I EAT? Seek help from a dietitian for individual calorie needs. Grains Whole grain or whole wheat bread. Brown rice. Whole grain or whole wheat pasta. Quinoa, bulgur, and whole grain cereals. Low-sodium cereals. Corn or whole wheat flour tortillas. Whole grain cornbread. Whole grain crackers. Low-sodium crackers. Vegetables Fresh or frozen vegetables  (raw, steamed, roasted, or grilled). Low-sodium or reduced-sodium tomato and vegetable juices. Low-sodium or reduced-sodium tomato sauce and paste. Low-sodium or reduced-sodium canned vegetables.  Fruits All fresh, canned (in natural juice), or frozen fruits. Meat and Other Protein Products Ground beef (85% or leaner), grass-fed beef, or beef trimmed of fat. Skinless chicken or turkey. Ground chicken or turkey. Pork trimmed of fat. All fish and seafood. Eggs. Dried beans, peas, or lentils. Unsalted nuts and seeds. Unsalted canned beans. Dairy Low-fat dairy products, such as skim or 1% milk, 2% or reduced-fat cheeses, low-fat ricotta or cottage cheese, or plain low-fat yogurt. Low-sodium or reduced-sodium cheeses. Fats and Oils Tub margarines without trans fats. Light or reduced-fat mayonnaise and salad dressings (reduced sodium). Avocado. Safflower, olive, or canola oils. Natural peanut or almond butter. Other Unsalted popcorn and pretzels. The items listed above may not be a complete list of recommended foods or beverages. Contact your dietitian for more options. WHAT FOODS ARE NOT RECOMMENDED? Grains White bread. White pasta. White rice. Refined cornbread. Bagels and croissants. Crackers that contain trans fat. Vegetables Creamed or fried vegetables. Vegetables in a cheese sauce. Regular canned vegetables. Regular canned tomato sauce and paste. Regular tomato and vegetable juices. Fruits Dried fruits. Canned fruit in light or heavy syrup. Fruit juice. Meat and Other Protein Products Fatty cuts of meat. Ribs, chicken wings, bacon, sausage, bologna, salami, chitterlings, fatback, hot dogs, bratwurst, and packaged luncheon meats. Salted nuts and seeds. Canned beans with salt. Dairy Whole or 2% milk, cream, half-and-half, and cream cheese. Whole-fat or sweetened yogurt. Full-fat   cheeses or blue cheese. Nondairy creamers and whipped toppings. Processed cheese, cheese spreads, or cheese  curds. Condiments Onion and garlic salt, seasoned salt, table salt, and sea salt. Canned and packaged gravies. Worcestershire sauce. Tartar sauce. Barbecue sauce. Teriyaki sauce. Soy sauce, including reduced sodium. Steak sauce. Fish sauce. Oyster sauce. Cocktail sauce. Horseradish. Ketchup and mustard. Meat flavorings and tenderizers. Bouillon cubes. Hot sauce. Tabasco sauce. Marinades. Taco seasonings. Relishes. Fats and Oils Butter, stick margarine, lard, shortening, ghee, and bacon fat. Coconut, palm kernel, or palm oils. Regular salad dressings. Other Pickles and olives. Salted popcorn and pretzels. The items listed above may not be a complete list of foods and beverages to avoid. Contact your dietitian for more information. WHERE CAN I FIND MORE INFORMATION? National Heart, [employer], and [employer] Institute: travelstabloid.com Document Released: 08/08/2011 Document Revised: 01/03/2014 Document Reviewed: 06/23/2013 [facility] Patient Information 2015 [place], [place]. This information is not intended to replace advice given to you by your health care provider. Make sure you discuss any questions you have with your health care provider. Tonsillitis Tonsillitis is an infection of the throat that causes the tonsils to become red, tender, and swollen. Tonsils are collections of lymphoid tissue at the back of the throat. Each tonsil has crevices (crypts). Tonsils help fight nose and throat infections and keep infection from spreading to other parts of the body for the first 18 months of life.  CAUSES Sudden (acute) tonsillitis is usually caused by infection with streptococcal bacteria. Long-lasting (chronic) tonsillitis occurs when the crypts of the tonsils become filled with pieces of food and bacteria, which makes it easy for the tonsils to become repeatedly infected. SYMPTOMS  Symptoms of tonsillitis include:  A sore throat, with possible difficulty swallowing.  White  patches on the tonsils.  Fever.  Tiredness.  New episodes of snoring during sleep, when you did not snore before.  Small, foul-smelling, yellowish-white pieces of material (tonsilloliths) that you occasionally cough up or spit out. The tonsilloliths can also cause you to have bad breath. DIAGNOSIS Tonsillitis can be diagnosed through a physical exam. Diagnosis can be confirmed with the results of lab tests, including a throat culture. TREATMENT  The goals of tonsillitis treatment include the reduction of the severity and duration of symptoms and prevention of associated conditions. Symptoms of tonsillitis can be improved with the use of steroids to reduce the swelling. Tonsillitis caused by bacteria can be treated with antibiotic medicines. Usually, treatment with antibiotic medicines is started before the cause of the tonsillitis is known. However, if it is determined that the cause is not bacterial, antibiotic medicines will not treat the tonsillitis. If attacks of tonsillitis are severe and frequent, your health care provider may recommend surgery to remove the tonsils (tonsillectomy). HOME CARE INSTRUCTIONS   Rest as much as possible and get plenty of sleep.  Drink plenty of fluids. While the throat is very sore, eat soft foods or liquids, such as sherbet, soups, or instant breakfast drinks.  Eat frozen ice pops.  Gargle with a warm or cold liquid to help soothe the throat. Mix 1/4 teaspoon of salt and 1/4 teaspoon of baking soda in 8 oz of water. SEEK MEDICAL CARE IF:   Large, tender lumps develop in your neck.  A rash develops.  A green, yellow-brown, or bloody substance is coughed up.  You are unable to swallow liquids or food for 24 hours.  You notice that only one of the tonsils is swollen. SEEK IMMEDIATE MEDICAL CARE IF:   You  develop any new symptoms such as vomiting, severe headache, stiff neck, chest pain, or trouble breathing or swallowing.  You have severe throat  pain along with drooling or voice changes.  You have severe pain, unrelieved with recommended medications.  You are unable to fully open the mouth.  You develop redness, swelling, or severe pain anywhere in the neck.  You have a fever. MAKE SURE YOU:   Understand these instructions.  Will watch your condition.  Will get help right away if you are not doing well or get worse. Document Released: 05/29/2005 Document Revised: 01/03/2014 Document Reviewed: 02/05/2013 [facility] Patient Information 2015 [place], [place]. This information is not intended to replace advice given to you by your health care provider. Make sure you discuss any questions you have with your health care provider.

## 2015-02-01 NOTE — Progress Notes (Signed)
   Subjective:    Patient ID: Travis Ellis, male    DOB: 09/25/1955, 59 y.o.   MRN: [id number]  HPI 59 year old male complain of  sore throat and chills for the past 24hrs.Prostate cancer is stable and controlled. Hypertension and hyperlipidemia is stable and controled. In March fell and suffered a contusion of left shoulder.Needs refills of his medications.  Review of Systems  Constitutional: Positive for fever, activity change and fatigue.  HENT: Positive for sore throat and trouble swallowing. Negative for congestion.   Eyes: Negative.   Respiratory: Negative.   Endocrine: Negative.   Genitourinary: Negative.   Musculoskeletal: Positive for arthralgias and gait problem.  Allergic/Immunologic: Negative.   Hematological: Negative.   Psychiatric/Behavioral: Negative.        Objective:   Physical Exam  Constitutional: He is oriented to person, place, and time. He appears well-developed and well-nourished.  HENT:  Head: Normocephalic.  Right Ear: External ear normal.  Left Ear: External ear normal.  Nose: Nose normal.  Mouth/Throat: Oropharynx is clear and moist.  Eyes: Conjunctivae and EOM are normal. Pupils are equal, round, and reactive to light.  Neck: Normal range of motion.  Cardiovascular: Normal rate, regular rhythm and normal heart sounds.   Pulmonary/Chest: Effort normal.  Neurological: He is alert and oriented to person, place, and time. He exhibits normal muscle tone. Coordination normal.  Psychiatric: He has a normal mood and affect. His behavior is normal.   Results for orders placed or performed in visit on 02/01/15  POCT CBC  Result Value Ref Range   WBC 11.6 (A) 4.6 - 10.2 K/uL   Lymph, poc 1.0 0.6 - 3.4   POC LYMPH PERCENT 8.9 (A) 10 - 50 %L   MID (cbc) 0.8 0 - 0.9   POC MID % 7.0 0 - 12 %M   POC Granulocyte 9.8 (A) 2 - 6.9   Granulocyte percent 84.1 (A) 37 - 80 %G   RBC 5.06 4.69 - 6.13 M/uL   Hemoglobin 16.6 14.1 - 18.1 g/dL   HCT, POC 49.0 43.5 -  53.7 %   MCV 96.8 80 - 97 fL   MCH, POC 32.8 (A) 27 - 31.2 pg   MCHC 33.9 31.8 - 35.4 g/dL   RDW, POC 12.7 %   Platelet Count, POC 253.0 142 - 424 K/uL   MPV 7.2 0 - 99.8 fL  POCT glucose (manual entry)  Result Value Ref Range   POC Glucose 115 (A) 70 - 99 mg/dl  POCT rapid strep A  Result Value Ref Range   Rapid Strep A Screen Negative Negative          Assessment & Plan:  Tonsillitis HTN/Dyslipidemia both controlled Med rev

## 2015-03-10 ENCOUNTER — Inpatient Hospital Stay (HOSPITAL_COMMUNITY)
Admission: EM | Admit: 2015-03-10 | Discharge: 2015-03-17 | DRG: 418 | Disposition: A | Discharge status: Home / Self Care | Payer: BLUE CROSS/BLUE SHIELD | Attending: Internal Medicine | Admitting: Internal Medicine

## 2015-03-10 ENCOUNTER — Emergency Department (HOSPITAL_COMMUNITY): Payer: BLUE CROSS/BLUE SHIELD

## 2015-03-10 ENCOUNTER — Encounter (HOSPITAL_COMMUNITY): Payer: Self-pay | Admitting: Emergency Medicine

## 2015-03-10 DIAGNOSIS — E876 Hypokalemia: Secondary | ICD-10-CM | POA: Diagnosis not present

## 2015-03-10 DIAGNOSIS — K219 Gastro-esophageal reflux disease without esophagitis: Secondary | ICD-10-CM | POA: Diagnosis present

## 2015-03-10 DIAGNOSIS — Z7982 Long term (current) use of aspirin: Secondary | ICD-10-CM

## 2015-03-10 DIAGNOSIS — Z8546 Personal history of malignant neoplasm of prostate: Secondary | ICD-10-CM | POA: Diagnosis not present

## 2015-03-10 DIAGNOSIS — Z419 Encounter for procedure for purposes other than remedying health state, unspecified: Secondary | ICD-10-CM

## 2015-03-10 DIAGNOSIS — R945 Abnormal results of liver function studies: Secondary | ICD-10-CM

## 2015-03-10 DIAGNOSIS — Z9079 Acquired absence of other genital organ(s): Secondary | ICD-10-CM | POA: Diagnosis present

## 2015-03-10 DIAGNOSIS — I10 Essential (primary) hypertension: Secondary | ICD-10-CM | POA: Diagnosis not present

## 2015-03-10 DIAGNOSIS — Z6828 Body mass index (BMI) 28.0-28.9, adult: Secondary | ICD-10-CM | POA: Diagnosis not present

## 2015-03-10 DIAGNOSIS — K851 Biliary acute pancreatitis: Secondary | ICD-10-CM | POA: Diagnosis present

## 2015-03-10 DIAGNOSIS — D72829 Elevated white blood cell count, unspecified: Secondary | ICD-10-CM | POA: Diagnosis present

## 2015-03-10 DIAGNOSIS — Z96651 Presence of right artificial knee joint: Secondary | ICD-10-CM | POA: Diagnosis present

## 2015-03-10 DIAGNOSIS — E669 Obesity, unspecified: Secondary | ICD-10-CM | POA: Diagnosis present

## 2015-03-10 DIAGNOSIS — K850 Idiopathic acute pancreatitis: Secondary | ICD-10-CM | POA: Diagnosis not present

## 2015-03-10 DIAGNOSIS — G709 Myoneural disorder, unspecified: Secondary | ICD-10-CM | POA: Diagnosis present

## 2015-03-10 DIAGNOSIS — K567 Ileus, unspecified: Secondary | ICD-10-CM | POA: Diagnosis not present

## 2015-03-10 DIAGNOSIS — K859 Acute pancreatitis without necrosis or infection, unspecified: Secondary | ICD-10-CM | POA: Diagnosis present

## 2015-03-10 DIAGNOSIS — M1990 Unspecified osteoarthritis, unspecified site: Secondary | ICD-10-CM | POA: Diagnosis present

## 2015-03-10 DIAGNOSIS — R7989 Other specified abnormal findings of blood chemistry: Secondary | ICD-10-CM | POA: Diagnosis not present

## 2015-03-10 DIAGNOSIS — E785 Hyperlipidemia, unspecified: Secondary | ICD-10-CM | POA: Diagnosis present

## 2015-03-10 DIAGNOSIS — Z79899 Other long term (current) drug therapy: Secondary | ICD-10-CM

## 2015-03-10 DIAGNOSIS — R740 Nonspecific elevation of levels of transaminase and lactic acid dehydrogenase [LDH]: Secondary | ICD-10-CM | POA: Diagnosis present

## 2015-03-10 DIAGNOSIS — R1013 Epigastric pain: Secondary | ICD-10-CM | POA: Diagnosis not present

## 2015-03-10 LAB — URINE MICROSCOPIC-ADD ON

## 2015-03-10 LAB — CBC WITH DIFFERENTIAL/PLATELET
BASOS PCT: 0 % (ref 0–1)
Basophils Absolute: 0 10*3/uL (ref 0.0–0.1)
Eosinophils Absolute: 0 10*3/uL (ref 0.0–0.7)
Eosinophils Relative: 0 % (ref 0–5)
HEMATOCRIT: 49.1 % (ref 39.0–52.0)
HEMOGLOBIN: 17.6 g/dL — AB (ref 13.0–17.0)
Lymphocytes Relative: 2 % — ABNORMAL LOW (ref 12–46)
Lymphs Abs: 0.3 10*3/uL — ABNORMAL LOW (ref 0.7–4.0)
MCH: 34.2 pg — AB (ref 26.0–34.0)
MCHC: 35.8 g/dL (ref 30.0–36.0)
MCV: 95.3 fL (ref 78.0–100.0)
MONO ABS: 0.5 10*3/uL (ref 0.1–1.0)
Monocytes Relative: 4 % (ref 3–12)
NEUTROS ABS: 12.6 10*3/uL — AB (ref 1.7–7.7)
NEUTROS PCT: 94 % — AB (ref 43–77)
PLATELETS: 228 10*3/uL (ref 150–400)
RBC: 5.15 MIL/uL (ref 4.22–5.81)
RDW: 11.8 % (ref 11.5–15.5)
WBC: 13.4 10*3/uL — ABNORMAL HIGH (ref 4.0–10.5)

## 2015-03-10 LAB — URINALYSIS, ROUTINE W REFLEX MICROSCOPIC
GLUCOSE, UA: NEGATIVE mg/dL
HGB URINE DIPSTICK: NEGATIVE
KETONES UR: NEGATIVE mg/dL
Nitrite: NEGATIVE
PH: 6.5 (ref 5.0–8.0)
Protein, ur: NEGATIVE mg/dL
SPECIFIC GRAVITY, URINE: 1.021 (ref 1.005–1.030)
Urobilinogen, UA: 4 mg/dL — ABNORMAL HIGH (ref 0.0–1.0)

## 2015-03-10 LAB — COMPREHENSIVE METABOLIC PANEL
ALT: 428 U/L — ABNORMAL HIGH (ref 17–63)
ANION GAP: 10 (ref 5–15)
AST: 328 U/L — ABNORMAL HIGH (ref 15–41)
Albumin: 4.2 g/dL (ref 3.5–5.0)
Alkaline Phosphatase: 81 U/L (ref 38–126)
BILIRUBIN TOTAL: 4.4 mg/dL — AB (ref 0.3–1.2)
BUN: 26 mg/dL — AB (ref 6–20)
CHLORIDE: 102 mmol/L (ref 101–111)
CO2: 25 mmol/L (ref 22–32)
Calcium: 9.3 mg/dL (ref 8.9–10.3)
Creatinine, Ser: 0.53 mg/dL — ABNORMAL LOW (ref 0.61–1.24)
GLUCOSE: 169 mg/dL — AB (ref 65–99)
Potassium: 3.6 mmol/L (ref 3.5–5.1)
Sodium: 137 mmol/L (ref 135–145)
Total Protein: 7.5 g/dL (ref 6.5–8.1)

## 2015-03-10 LAB — ACETAMINOPHEN LEVEL

## 2015-03-10 LAB — LIPASE, BLOOD: Lipase: 786 U/L — ABNORMAL HIGH (ref 22–51)

## 2015-03-10 MED ORDER — SODIUM CHLORIDE 0.9 % IV SOLN
INTRAVENOUS | Status: DC
  Administered 2015-03-10 – 2015-03-11 (×2): via INTRAVENOUS

## 2015-03-10 MED ORDER — PANTOPRAZOLE SODIUM 40 MG IV SOLR
40.0000 mg | Freq: Every day | INTRAVENOUS | Status: DC
  Administered 2015-03-10 – 2015-03-15 (×6): 40 mg via INTRAVENOUS
  Filled 2015-03-10 (×7): qty 40

## 2015-03-10 MED ORDER — CHLORHEXIDINE GLUCONATE 0.12 % MT SOLN
15.0000 mL | Freq: Two times a day (BID) | OROMUCOSAL | Status: DC
  Administered 2015-03-10 – 2015-03-16 (×12): 15 mL via OROMUCOSAL
  Filled 2015-03-10 (×13): qty 15

## 2015-03-10 MED ORDER — ONDANSETRON HCL 4 MG/2ML IJ SOLN
4.0000 mg | Freq: Four times a day (QID) | INTRAMUSCULAR | Status: DC | PRN
  Administered 2015-03-11 (×2): 4 mg via INTRAVENOUS
  Filled 2015-03-10 (×2): qty 2

## 2015-03-10 MED ORDER — IOHEXOL 300 MG/ML  SOLN: 25.0000 mL | Freq: Once | INTRAMUSCULAR | Status: AC | PRN

## 2015-03-10 MED ORDER — ONDANSETRON HCL 4 MG PO TABS: 4.0000 mg | ORAL_TABLET | Freq: Four times a day (QID) | ORAL | Status: DC | PRN

## 2015-03-10 MED ORDER — IOHEXOL 300 MG/ML  SOLN
100.0000 mL | Freq: Once | INTRAMUSCULAR | Status: AC | PRN
  Administered 2015-03-10: 100 mL via INTRAVENOUS

## 2015-03-10 MED ORDER — ONDANSETRON HCL 4 MG/2ML IJ SOLN
4.0000 mg | Freq: Once | INTRAMUSCULAR | Status: AC
Start: 2015-03-10 — End: 2015-03-10
  Administered 2015-03-10: 4 mg via INTRAVENOUS
  Filled 2015-03-10: qty 2

## 2015-03-10 MED ORDER — HYDROMORPHONE HCL 1 MG/ML IJ SOLN
1.0000 mg | INTRAMUSCULAR | Status: DC | PRN
  Administered 2015-03-10 – 2015-03-11 (×3): 1 mg via INTRAVENOUS
  Filled 2015-03-10 (×3): qty 1

## 2015-03-10 MED ORDER — MORPHINE SULFATE 4 MG/ML IJ SOLN
4.0000 mg | Freq: Once | INTRAMUSCULAR | Status: AC
  Administered 2015-03-10: 4 mg via INTRAVENOUS
  Filled 2015-03-10: qty 1

## 2015-03-10 MED ORDER — HYDROMORPHONE HCL 1 MG/ML IJ SOLN
1.0000 mg | Freq: Once | INTRAMUSCULAR | Status: AC
  Administered 2015-03-10: 1 mg via INTRAVENOUS
  Filled 2015-03-10: qty 1

## 2015-03-10 MED ORDER — NIACIN 100 MG PO TABS
100.0000 mg | ORAL_TABLET | Freq: Every day | ORAL | Status: DC
  Administered 2015-03-10 – 2015-03-15 (×5): 100 mg via ORAL
  Filled 2015-03-10 (×7): qty 1

## 2015-03-10 MED ORDER — CETYLPYRIDINIUM CHLORIDE 0.05 % MT LIQD
7.0000 mL | Freq: Two times a day (BID) | OROMUCOSAL | Status: DC
  Administered 2015-03-11 – 2015-03-15 (×8): 7 mL via OROMUCOSAL

## 2015-03-10 MED ORDER — LORATADINE 10 MG PO TABS
10.0000 mg | ORAL_TABLET | Freq: Every day | ORAL | Status: DC
  Administered 2015-03-11 – 2015-03-15 (×4): 10 mg via ORAL
  Filled 2015-03-10 (×6): qty 1

## 2015-03-10 MED ORDER — SODIUM CHLORIDE 0.9 % IV BOLUS (SEPSIS)
1000.0000 mL | Freq: Once | INTRAVENOUS | Status: AC
  Administered 2015-03-10: 1000 mL via INTRAVENOUS

## 2015-03-10 NOTE — H&P (Signed)
PCP:  GUEST, Veneda Melter, MD  ([facility])   Referring provider PA Shari   Chief Complaint:  Abdominal pain HPI: Travis Ellis is a 59 y.o. male   has a past medical history of Cancer (2008); GERD (gastroesophageal reflux disease); Hypertension; Hyperlipidemia; Arthritis; Allergy; Neuromuscular disorder; and Prostate cancer.   Presented with 24 hours of abdominal pain starting after eating and being meal. Today he developed nausea and vomiting worse with eating but has been going on all day now. Denies any diarrhea reportssubjective fever no chills. Pain is mainly left upper quadrant. On presentation to emergency department patient was noted to have evaluated by blood cell count up to 13 evidence of LFT elevation AST up to free 128 ALT 428 total bilirubin up to 4.4 lipase elevated at 786 . [place] of Abdomen Was Ordered That Showed Limited Study without Evidence of Gallstones or Wall Thickening. Patient afebrile in emergency department CT scan has been ordered and currently pending.  ER provider discussed case with GI who reccommended further imaging.  Patient reports taking acetaminophen once a day at bedtime but did not exceed recommended dosages. Reports drinking very little alcohol Currently pain improved after IV dilaudid.    Hospitalist was called for admission for pancreatitis  Review of Systems:    Pertinent positives include:   Constitutional:  No weight loss, night sweats, Fevers, chills, fatigue, weight loss  HEENT:  No headaches, Difficulty swallowing,Tooth/dental problems,Sore throat,  No sneezing, itching, ear ache, nasal congestion, post nasal drip,  Cardio-vascular:  No chest pain, Orthopnea, PND, anasarca, dizziness, palpitations.no Bilateral lower extremity swelling  GI:  No heartburn, indigestion, abdominal pain, nausea, vomiting, diarrhea, change in bowel habits, loss of appetite, melena, blood in stool, hematemesis Resp:  no shortness of breath at rest.  No dyspnea on exertion, No excess mucus, no productive cough, No non-productive cough, No coughing up of blood.No change in color of mucus.No wheezing. Skin:  no rash or lesions. No jaundice GU:  no dysuria, change in color of urine, no urgency or frequency. No straining to urinate.  No flank pain.  Musculoskeletal:  No joint pain or no joint swelling. No decreased range of motion. No back pain.  Psych:  No change in mood or affect. No depression or anxiety. No memory loss.  Neuro: no localizing neurological complaints, no tingling, no weakness, no double vision, no gait abnormality, no slurred speech, no confusion  Otherwise ROS are negative except for above, 10 systems were reviewed  Past Medical History: Past Medical History  Diagnosis Date  . Cancer 2008    prostate  . GERD (gastroesophageal reflux disease)   . Hypertension   . Hyperlipidemia   . Arthritis   . Allergy   . Neuromuscular disorder   . Prostate cancer     [id number] adenocarcinoma of the prostate   Past Surgical History  Procedure Laterality Date  . Replacement total knee  2010    right knee  . Prostatectomy  12/2006    cancer  . Joint replacement Right 08/2009    knee     Medications: Prior to Admission medications   Medication Sig Start Date End Date Taking? Authorizing Provider  acetaminophen (TYLENOL) 500 MG tablet Take 1,000 mg by mouth at bedtime.    Yes Historical Provider, MD  aspirin 325 MG tablet Take 325 mg by mouth daily.     Yes Historical Provider, MD  B Complex-C (B-COMPLEX WITH VITAMIN C) tablet Take 1 tablet by mouth daily.  Yes Historical Provider, MD  cetirizine (ZYRTEC) 10 MG tablet Take 10 mg by mouth daily.     Yes Historical Provider, MD  Cholecalciferol (VITAMIN D) 2000 UNITS tablet Take 2,000 Units by mouth daily.     Yes Historical Provider, MD  dicyclomine (BENTYL) 20 MG tablet Take 1 tablet (20 mg total) by mouth every 6 (six) hours as needed for spasms (for abdominal  cramping). 09/29/14  Yes Linton Flemings, MD  Fish Oil-Cholecalciferol (FISH OIL + D3 PO) Take 300 mg by mouth daily.   Yes Historical Provider, MD  HYDROcodone-acetaminophen (NORCO/VICODIN) 5-325 MG per tablet Take 1 tablet by mouth every 6 (six) hours as needed for moderate pain or severe pain.   Yes Historical Provider, MD  Javier Docker Oil 300 MG CAPS Take 1 capsule by mouth daily.    Yes Historical Provider, MD  lisinopril-hydrochlorothiazide (PRINZIDE,ZESTORETIC) 10-12.5 MG per tablet TAKE 1 TABLET BY MOUTH DAILY. 02/01/15  Yes Orma Flaming, MD  meloxicam (MOBIC) 15 MG tablet Take 1 tablet by mouth Daily. 04/05/11  Yes Historical Provider, MD  Multiple Vitamins-Minerals (MULTIVITAMIN WITH MINERALS) tablet Take 1 tablet by mouth daily.     Yes Historical Provider, MD  niacin 100 MG tablet Take 100 mg by mouth at bedtime.    Yes Historical Provider, MD  omeprazole (PRILOSEC) 20 MG capsule Take 20 mg by mouth See admin instructions. Takes 1 capsule every 3rd day   Yes Historical Provider, MD  ondansetron (ZOFRAN ODT) 8 MG disintegrating tablet Take 1 tablet (8 mg total) by mouth every 8 (eight) hours as needed for nausea or vomiting. 09/29/14  Yes Linton Flemings, MD  simvastatin (ZOCOR) 10 MG tablet Take 10 mg by mouth daily.   Yes Historical Provider, MD  azithromycin (ZITHROMAX) 500 MG tablet Take 1 tablet (500 mg total) by mouth daily. Patient not taking: Reported on 03/10/2015 02/01/15   Orma Flaming, MD  simvastatin (ZOCOR) 20 MG tablet TAKE 1 TABLET BY MOUTH AT BEDTIME. Patient not taking: Reported on 03/10/2015 02/01/15   Orma Flaming, MD    Allergies:  No Known Allergies  Social History:  Ambulatory   independently    Lives at home With family     reports that he has never smoked. He has never used smokeless tobacco. He reports that he drinks alcohol. He reports that he does not use illicit drugs.    Family History: family history includes Breast cancer in his mother; COPD in his father; Cancer in his  mother; Heart disease in his father and paternal grandfather.    Physical Exam: Patient Vitals for the past 24 hrs:  BP Temp Temp src Pulse Resp SpO2  03/10/15 1937 140/75 mmHg - - 91 18 96 %  03/10/15 1747 151/78 mmHg 97.3 F (36.3 C) Oral 84 16 96 %  03/10/15 1630 148/89 mmHg - - 78 - 94 %  03/10/15 1600 143/84 mmHg - - 91 - 97 %  03/10/15 1556 148/87 mmHg 98 F (36.7 C) Oral 87 18 96 %    1. General:  in No Acute distress 2. Psychological: Alert and Oriented 3. Head/ENT:    Dry Mucous Membranes                          Head Non traumatic, neck supple                          Normal  Dentition 4. SKIN:   decreased Skin turgor,  Skin clean Dry and intact no rash, jaundice noted 5. Heart: Regular rate and rhythm no Murmur, Rub or gallop 6. Lungs:   no wheezes occasional mild crackles   7. Abdomen: Soft, non-tender, Non distended 8. Lower extremities: no clubbing, cyanosis, or edema 9. Neurologically Grossly intact, moving all 4 extremities equally 10. MSK: Normal range of motion  body mass index is unknown because there is no weight on file.   Labs on Admission:   Results for orders placed or performed during the hospital encounter of 03/10/15 (from the past 24 hour(s))  Comprehensive metabolic panel     Status: Abnormal   Collection Time: 03/10/15  4:36 PM  Result Value Ref Range   Sodium 137 135 - 145 mmol/L   Potassium 3.6 3.5 - 5.1 mmol/L   Chloride 102 101 - 111 mmol/L   CO2 25 22 - 32 mmol/L   Glucose, Bld 169 (H) 65 - 99 mg/dL   BUN 26 (H) 6 - 20 mg/dL   Creatinine, Ser 0.53 (L) 0.61 - 1.24 mg/dL   Calcium 9.3 8.9 - 10.3 mg/dL   Total Protein 7.5 6.5 - 8.1 g/dL   Albumin 4.2 3.5 - 5.0 g/dL   AST 328 (H) 15 - 41 U/L   ALT 428 (H) 17 - 63 U/L   Alkaline Phosphatase 81 38 - 126 U/L   Total Bilirubin 4.4 (H) 0.3 - 1.2 mg/dL   GFR calc non Af Amer >60 >60 mL/min   GFR calc Af Amer >60 >60 mL/min   Anion gap 10 5 - 15  CBC with Differential     Status: Abnormal     Collection Time: 03/10/15  4:36 PM  Result Value Ref Range   WBC 13.4 (H) 4.0 - 10.5 K/uL   RBC 5.15 4.22 - 5.81 MIL/uL   Hemoglobin 17.6 (H) 13.0 - 17.0 g/dL   HCT 49.1 39.0 - 52.0 %   MCV 95.3 78.0 - 100.0 fL   MCH 34.2 (H) 26.0 - 34.0 pg   MCHC 35.8 30.0 - 36.0 g/dL   RDW 11.8 11.5 - 15.5 %   Platelets 228 150 - 400 K/uL   Neutrophils Relative % 94 (H) 43 - 77 %   Neutro Abs 12.6 (H) 1.7 - 7.7 K/uL   Lymphocytes Relative 2 (L) 12 - 46 %   Lymphs Abs 0.3 (L) 0.7 - 4.0 K/uL   Monocytes Relative 4 3 - 12 %   Monocytes Absolute 0.5 0.1 - 1.0 K/uL   Eosinophils Relative 0 0 - 5 %   Eosinophils Absolute 0.0 0.0 - 0.7 K/uL   Basophils Relative 0 0 - 1 %   Basophils Absolute 0.0 0.0 - 0.1 K/uL  Lipase, blood     Status: Abnormal   Collection Time: 03/10/15  4:36 PM  Result Value Ref Range   Lipase 786 (H) 22 - 51 U/L  Urinalysis, Routine w reflex microscopic (not at [facility])     Status: Abnormal   Collection Time: 03/10/15  5:32 PM  Result Value Ref Range   Color, Urine AMBER (A) YELLOW   APPearance CLEAR CLEAR   Specific Gravity, Urine 1.021 1.005 - 1.030   pH 6.5 5.0 - 8.0   Glucose, UA NEGATIVE NEGATIVE mg/dL   Hgb urine dipstick NEGATIVE NEGATIVE   Bilirubin Urine MODERATE (A) NEGATIVE   Ketones, ur NEGATIVE NEGATIVE mg/dL   Protein, ur NEGATIVE NEGATIVE mg/dL   Urobilinogen, UA  4.0 (H) 0.0 - 1.0 mg/dL   Nitrite NEGATIVE NEGATIVE   Leukocytes, UA SMALL (A) NEGATIVE  Urine microscopic-add on     Status: None   Collection Time: 03/10/15  5:32 PM  Result Value Ref Range   Squamous Epithelial / LPF RARE RARE   WBC, UA 0-2 <3 WBC/hpf   RBC / HPF 3-6 <3 RBC/hpf   Urine-Other MUCOUS PRESENT     UA no evidence of UTI  Lab Results  Component Value Date   HGBA1C 5.1 10/06/2014    CrCl cannot be calculated (Unknown ideal weight.).  BNP (last 3 results) No results for input(s): PROBNP in the last 8760 hours.  Other results:  I have pearsonaly reviewed this: ECG  REPORT not obtained There were no vitals filed for this visit.   Cultures: No results found for: SDES, SPECREQUEST, CULT, REPTSTATUS   Radiological Exams on Admission: US Abdomen Complete  03/10/2015   CLINICAL DATA:  59 year old male with possible gallstone.  EXAM: ULTRASOUND ABDOMEN COMPLETE  COMPARISON:  CT dated 09/29/2014  FINDINGS: Evaluation of the study is limited due to overlying bowel gas and patient's body habitus.  Gallbladder: No gallstones or wall thickening visualized. No sonographic Murphy sign noted.  Common bile duct: Diameter: 3 mm  Liver: Heterogeneous echotexture.  IVC: Not well visualized  Pancreas: Not seen  Spleen: Grossly unremarkable as visualized. There is a small perisplenic free fluid.  Right Kidney: Length: 12 cm.  No hydronephrosis or echogenic stone.  Left Kidney: Length: 14 cm.  Grossly unremarkable as visualized  Abdominal aorta: The proximal and distal portion of the aorta are not visualized. The midportion of the aorta appears unremarkable and measures 2 cm in diameter.  Other findings: None.  IMPRESSION: Limited study due to patient's body habitus and overlying bowel gas. No definite gallstone.  small perisplenic free fluid   Electronically Signed   By: Anner Crete M.D.   On: 03/10/2015 19:40    Chart has been reviewed  Family  at  Bedside  plan of care was discussed with  Wife Wallace Keller [phone]  Assessment/Plan  59 yo male with history of hypertension presents with abdominal pain nausea and vomiting was found to have elevated lipase and LFTs. Ultrasound did not show any evidence of biliary obstruction. CT scan has been ordered and currently pending. ER has discussed case with [facility] who is aware of patient.   Present on Admission:  . HTN (hypertension) hold lisinopril/HCTZ given possibility of pancreatitis monitor blood pressure  . Pancreatitis - cause unclear at this point , no evidence of gallbladder stones. Denies significant alcohol use.  Will stop lisinopril hydrochlorothiazide. Check lipid panel. Obtain CT scan of abdomen and pelvis to evaluate for any mass or obstruction.  Marland Kitchen LFT elevation - will rehydrate and monitor repeat LFT's in the morning. Obtain acetaminophen level, CT scan of abdomen, hepatitis panel  . Leukocytosis - of note patient had had elevated blood but cell count in the past. Appears to be somewhat hemoconcentrated. CT scan of abdomen and pelvis pending patient is afebrile. Holding off and on antibiotics for now    Prophylaxis: SCD   CODE STATUS:  FULL CODE    Disposition: To home once workup is complete and patient is stable  Other plan as per orders.  I have spent a total of 55 min on this admission  Maritza Goldsborough 03/10/2015, 9:47 PM  [facility]  Pager [phone]   after 2 AM please page floor coverage PA  If 7AM-7PM, please contact the day team taking care of the patient  [email or web address]  Password [email or web address]

## 2015-03-10 NOTE — ED Provider Notes (Signed)
CSN: [id number]     Arrival date & time 03/10/15  1545 History   First MD Initiated Contact with Patient 03/10/15 1548     Chief Complaint  Patient presents with  . Emesis  . Abdominal Pain     (Consider location/radiation/quality/duration/timing/severity/associated sxs/prior Treatment) Patient is a 59 y.o. male presenting with vomiting and abdominal pain. The history is provided by the patient. No language interpreter was used.  Emesis Severity:  Moderate Associated symptoms: abdominal pain   Associated symptoms: no chills and no diarrhea   Associated symptoms comment:  He reports mild abdominal pain last evening after a heavy meal earlier in the day. This morning he woke with nausea and vomiting that has persisted all day. No hematemesis. No diarrhea. He complains his abdominal pain is worse than last night and concentrated in the left mid-abdomen. No significant distention. No chest pain or SOB. Abdominal Pain Associated symptoms: nausea and vomiting   Associated symptoms: no chills, no diarrhea and no fever     Past Medical History  Diagnosis Date  . Cancer 2008    prostate  . GERD (gastroesophageal reflux disease)   . Hypertension   . Hyperlipidemia   . Arthritis   . Allergy   . Neuromuscular disorder   . Prostate cancer     [id number] adenocarcinoma of the prostate   Past Surgical History  Procedure Laterality Date  . Replacement total knee  2010    right knee  . Prostatectomy  12/2006    cancer  . Joint replacement Right 08/2009    knee   Family History  Problem Relation Age of Onset  . Breast cancer Mother   . Cancer Mother     skin  . Heart disease Father     heart attack  . COPD Father     smoker  . Heart disease Paternal Grandfather    History  Substance Use Topics  . Smoking status: Never Smoker   . Smokeless tobacco: Never Used  . Alcohol Use: Yes     Comment: rarely    Review of Systems  Constitutional: Negative for fever and chills.   Respiratory: Negative.   Cardiovascular: Negative.   Gastrointestinal: Positive for nausea, vomiting and abdominal pain. Negative for diarrhea.  Musculoskeletal: Negative.   Skin: Negative.   Neurological: Negative.       Allergies  Review of patient's allergies indicates no known allergies.  Home Medications   Prior to Admission medications   Medication Sig Start Date End Date Taking? Authorizing Provider  acetaminophen (TYLENOL) 500 MG tablet Take 1,000 mg by mouth at bedtime.     Historical Provider, MD  aspirin 325 MG tablet Take 325 mg by mouth daily.      Historical Provider, MD  azithromycin (ZITHROMAX) 500 MG tablet Take 1 tablet (500 mg total) by mouth daily. 02/01/15   Orma Flaming, MD  B Complex-C (B-COMPLEX WITH VITAMIN C) tablet Take 1 tablet by mouth daily.      Historical Provider, MD  cetirizine (ZYRTEC) 10 MG tablet Take 10 mg by mouth daily.      Historical Provider, MD  Cholecalciferol (VITAMIN D) 2000 UNITS tablet Take 2,000 Units by mouth daily.      Historical Provider, MD  dicyclomine (BENTYL) 20 MG tablet Take 1 tablet (20 mg total) by mouth every 6 (six) hours as needed for spasms (for abdominal cramping). Patient not taking: Reported on 10/06/2014 09/29/14   Linton Flemings, MD  Krill Oil 300 MG  CAPS Take 1 capsule by mouth daily.     Historical Provider, MD  lisinopril-hydrochlorothiazide (PRINZIDE,ZESTORETIC) 10-12.5 MG per tablet TAKE 1 TABLET BY MOUTH DAILY. 02/01/15   Orma Flaming, MD  meloxicam (MOBIC) 15 MG tablet Take 1 tablet by mouth Daily. 04/05/11   Historical Provider, MD  Multiple Vitamins-Minerals (MULTIVITAMIN WITH MINERALS) tablet Take 1 tablet by mouth daily.      Historical Provider, MD  niacin 100 MG tablet Take 100 mg by mouth at bedtime.     Historical Provider, MD  omeprazole (PRILOSEC) 20 MG capsule Take 20 mg by mouth See admin instructions. Takes 1 capsule every 3rd day    Historical Provider, MD  ondansetron (ZOFRAN ODT) 8 MG disintegrating  tablet Take 1 tablet (8 mg total) by mouth every 8 (eight) hours as needed for nausea or vomiting. 09/29/14   Linton Flemings, MD  simvastatin (ZOCOR) 20 MG tablet TAKE 1 TABLET BY MOUTH AT BEDTIME. 02/01/15   Orma Flaming, MD   BP 148/87 mmHg  Pulse 87  Temp(Src) 98 F (36.7 C) (Oral)  Resp 18  SpO2 96% Physical Exam  Constitutional: He is oriented to person, place, and time. He appears well-developed and well-nourished.  HENT:  Head: Normocephalic.  Neck: Normal range of motion. Neck supple.  Cardiovascular: Normal rate and regular rhythm.   Pulmonary/Chest: Effort normal and breath sounds normal.  Abdominal: Soft. Bowel sounds are normal. He exhibits no distension and no mass. There is tenderness. There is no rebound and no guarding.  Diffuse abdominal tenderness, greater in the left mid- to upper quadrant.  Musculoskeletal: Normal range of motion.  Neurological: He is alert and oriented to person, place, and time.  Skin: Skin is warm and dry. No rash noted.  Psychiatric: He has a normal mood and affect.    ED Course  Procedures (including critical care time) Labs Review Labs Reviewed  COMPREHENSIVE METABOLIC PANEL  CBC WITH DIFFERENTIAL/PLATELET  LIPASE, BLOOD   Results for orders placed or performed during the hospital encounter of 03/10/15  Comprehensive metabolic panel  Result Value Ref Range   Sodium 137 135 - 145 mmol/L   Potassium 3.6 3.5 - 5.1 mmol/L   Chloride 102 101 - 111 mmol/L   CO2 25 22 - 32 mmol/L   Glucose, Bld 169 (H) 65 - 99 mg/dL   BUN 26 (H) 6 - 20 mg/dL   Creatinine, Ser 0.53 (L) 0.61 - 1.24 mg/dL   Calcium 9.3 8.9 - 10.3 mg/dL   Total Protein 7.5 6.5 - 8.1 g/dL   Albumin 4.2 3.5 - 5.0 g/dL   AST 328 (H) 15 - 41 U/L   ALT 428 (H) 17 - 63 U/L   Alkaline Phosphatase 81 38 - 126 U/L   Total Bilirubin 4.4 (H) 0.3 - 1.2 mg/dL   GFR calc non Af Amer >60 >60 mL/min   GFR calc Af Amer >60 >60 mL/min   Anion gap 10 5 - 15  CBC with Differential  Result  Value Ref Range   WBC 13.4 (H) 4.0 - 10.5 K/uL   RBC 5.15 4.22 - 5.81 MIL/uL   Hemoglobin 17.6 (H) 13.0 - 17.0 g/dL   HCT 49.1 39.0 - 52.0 %   MCV 95.3 78.0 - 100.0 fL   MCH 34.2 (H) 26.0 - 34.0 pg   MCHC 35.8 30.0 - 36.0 g/dL   RDW 11.8 11.5 - 15.5 %   Platelets 228 150 - 400 K/uL   Neutrophils Relative %  94 (H) 43 - 77 %   Neutro Abs 12.6 (H) 1.7 - 7.7 K/uL   Lymphocytes Relative 2 (L) 12 - 46 %   Lymphs Abs 0.3 (L) 0.7 - 4.0 K/uL   Monocytes Relative 4 3 - 12 %   Monocytes Absolute 0.5 0.1 - 1.0 K/uL   Eosinophils Relative 0 0 - 5 %   Eosinophils Absolute 0.0 0.0 - 0.7 K/uL   Basophils Relative 0 0 - 1 %   Basophils Absolute 0.0 0.0 - 0.1 K/uL  Lipase, blood  Result Value Ref Range   Lipase 786 (H) 22 - 51 U/L  Urinalysis, Routine w reflex microscopic (not at [facility])  Result Value Ref Range   Color, Urine AMBER (A) YELLOW   APPearance CLEAR CLEAR   Specific Gravity, Urine 1.021 1.005 - 1.030   pH 6.5 5.0 - 8.0   Glucose, UA NEGATIVE NEGATIVE mg/dL   Hgb urine dipstick NEGATIVE NEGATIVE   Bilirubin Urine MODERATE (A) NEGATIVE   Ketones, ur NEGATIVE NEGATIVE mg/dL   Protein, ur NEGATIVE NEGATIVE mg/dL   Urobilinogen, UA 4.0 (H) 0.0 - 1.0 mg/dL   Nitrite NEGATIVE NEGATIVE   Leukocytes, UA SMALL (A) NEGATIVE  Urine microscopic-add on  Result Value Ref Range   Squamous Epithelial / LPF RARE RARE   WBC, UA 0-2 <3 WBC/hpf   RBC / HPF 3-6 <3 RBC/hpf   Urine-Other MUCOUS PRESENT     Imaging Review No results found.   EKG Interpretation None      MDM   Final diagnoses:  None    1. Pancreatitis   5:30 - symptoms improving. Pain persistent but better. Abdomen remains soft, non-distended. Tolerating ice chips. Ginger ale provided for PO challenge.  6:45: labs reviewed. Significantly elevated liver functions and lipase suggesting possible obstruction requiring ultrasound exam. Symptoms currently controlled.  Ultrasound inconclusive. Gastroenterology consulted,  [facility] paged for admission. Patient and family updated.   Discussed the patient's presentation with Dr. Michail Sermon (GI) who recommended CT scan. Will admit to medicine and GI will consult in the morning. Discussed with Dr. Roel Cluck who accepts the patient for admission. The patient continues to be comfortable, without further vomiting.   Charlann Lange, PA-C 03/10/15 2316  Orpah Greek, MD 03/11/15 [phone]

## 2015-03-10 NOTE — ED Notes (Signed)
Per pt, states abdominal pain that started yesterday after eating a big meal-N/V today

## 2015-03-11 DIAGNOSIS — D72829 Elevated white blood cell count, unspecified: Secondary | ICD-10-CM

## 2015-03-11 DIAGNOSIS — K851 Biliary acute pancreatitis: Principal | ICD-10-CM

## 2015-03-11 DIAGNOSIS — R7989 Other specified abnormal findings of blood chemistry: Secondary | ICD-10-CM

## 2015-03-11 DIAGNOSIS — R1013 Epigastric pain: Secondary | ICD-10-CM

## 2015-03-11 LAB — MAGNESIUM: MAGNESIUM: 2 mg/dL (ref 1.7–2.4)

## 2015-03-11 LAB — PHOSPHORUS: PHOSPHORUS: 3.3 mg/dL (ref 2.5–4.6)

## 2015-03-11 LAB — COMPREHENSIVE METABOLIC PANEL
ALT: 376 U/L — ABNORMAL HIGH (ref 17–63)
AST: 219 U/L — ABNORMAL HIGH (ref 15–41)
Albumin: 3.8 g/dL (ref 3.5–5.0)
Alkaline Phosphatase: 79 U/L (ref 38–126)
Anion gap: 9 (ref 5–15)
BUN: 23 mg/dL — ABNORMAL HIGH (ref 6–20)
CHLORIDE: 102 mmol/L (ref 101–111)
CO2: 26 mmol/L (ref 22–32)
CREATININE: 0.53 mg/dL — AB (ref 0.61–1.24)
Calcium: 8.3 mg/dL — ABNORMAL LOW (ref 8.9–10.3)
Glucose, Bld: 115 mg/dL — ABNORMAL HIGH (ref 65–99)
Potassium: 3.1 mmol/L — ABNORMAL LOW (ref 3.5–5.1)
SODIUM: 137 mmol/L (ref 135–145)
TOTAL PROTEIN: 6.7 g/dL (ref 6.5–8.1)
Total Bilirubin: 2.7 mg/dL — ABNORMAL HIGH (ref 0.3–1.2)

## 2015-03-11 LAB — CBC
HCT: 49.6 % (ref 39.0–52.0)
Hemoglobin: 17.1 g/dL — ABNORMAL HIGH (ref 13.0–17.0)
MCH: 34 pg (ref 26.0–34.0)
MCHC: 34.5 g/dL (ref 30.0–36.0)
MCV: 98.6 fL (ref 78.0–100.0)
Platelets: 186 10*3/uL (ref 150–400)
RBC: 5.03 MIL/uL (ref 4.22–5.81)
RDW: 12.4 % (ref 11.5–15.5)
WBC: 18.5 10*3/uL — ABNORMAL HIGH (ref 4.0–10.5)

## 2015-03-11 LAB — LIPID PANEL
Cholesterol: 130 mg/dL (ref 0–200)
HDL: 37 mg/dL — AB (ref >40)
LDL Cholesterol: 77 mg/dL (ref 0–99)
Total CHOL/HDL Ratio: 3.5 RATIO
Triglycerides: 81 mg/dL (ref <150)
VLDL: 16 mg/dL (ref 0–40)

## 2015-03-11 LAB — TSH: TSH: 0.391 u[IU]/mL (ref 0.350–4.500)

## 2015-03-11 LAB — LIPASE, BLOOD: Lipase: 324 U/L — ABNORMAL HIGH (ref 22–51)

## 2015-03-11 MED ORDER — SODIUM CHLORIDE 0.9 % IV SOLN
INTRAVENOUS | Status: AC
  Administered 2015-03-11 – 2015-03-12 (×2): via INTRAVENOUS

## 2015-03-11 MED ORDER — HYDROMORPHONE HCL 1 MG/ML IJ SOLN
1.0000 mg | INTRAMUSCULAR | Status: DC | PRN
  Administered 2015-03-11 – 2015-03-16 (×43): 1 mg via INTRAVENOUS
  Filled 2015-03-11 (×44): qty 1

## 2015-03-11 NOTE — Progress Notes (Signed)
PROGRESS NOTE  Travis Ellis SFK:[id number] DOB: [birth year] DOA: 03/10/2015 PCP: Travis Portela, MD  HPI: Travis Ellis is a pleasant 60 yo M who presented with 24 hours of abdominal pain starting after eating and being meal, found to have acute pancreatitis and with significant elevation of his LFTs and bilirubin  Subjective / 24 H Interval events - feels improved this morning, less nausea, less abdominal pain  Assessment/Plan: Active Problems:   HTN (hypertension)   Pancreatitis   LFT elevation   Leukocytosis  Acute pancreatitis - unclear etiology, given elevated bilirubin suspect may be related to passing a stone, however no CBD / bile ducts dilatation observed on imaging - discussed with GI, will see in consultation - denies ETOH, no new medications, lipid panel unremarkable - still appears hemo-concentrated this morning, will increase fluid rate - reactive leukocytosis noted - continue NPO, continue pain control  LFT elevation - improving, continue to monitor  HTN - holding home medications for now, blood pressure controlled  HLD - on statin  Diet: Diet NPO time specified Fluids: NS @ 200 DVT Prophylaxis: SCD  Code Status: Full Code Family Communication: d/w wife bedside  Disposition Plan: remain inpatient  Consultants:  GI  Procedures:  None    Antibiotics  Anti-infectives    None       Studies  US Abdomen Complete  03/10/2015   CLINICAL DATA:  59 year old male with possible gallstone.  EXAM: ULTRASOUND ABDOMEN COMPLETE  COMPARISON:  CT dated 09/29/2014  FINDINGS: Evaluation of the study is limited due to overlying bowel gas and patient's body habitus.  Gallbladder: No gallstones or wall thickening visualized. No sonographic Murphy sign noted.  Common bile duct: Diameter: 3 mm  Liver: Heterogeneous echotexture.  IVC: Not well visualized  Pancreas: Not seen  Spleen: Grossly unremarkable as visualized. There is a small perisplenic free fluid.   Right Kidney: Length: 12 cm.  No hydronephrosis or echogenic stone.  Left Kidney: Length: 14 cm.  Grossly unremarkable as visualized  Abdominal aorta: The proximal and distal portion of the aorta are not visualized. The midportion of the aorta appears unremarkable and measures 2 cm in diameter.  Other findings: None.  IMPRESSION: Limited study due to patient's body habitus and overlying bowel gas. No definite gallstone.  small perisplenic free fluid   Electronically Signed   By: Anner Crete M.D.   On: 03/10/2015 19:40   Ct Abdomen Pelvis W Contrast  03/10/2015   CLINICAL DATA:  Acute onset of generalized abdominal pain, with nausea and vomiting. Initial encounter.  EXAM: CT ABDOMEN AND PELVIS WITH CONTRAST  TECHNIQUE: Multidetector CT imaging of the abdomen and pelvis was performed using the standard protocol following bolus administration of intravenous contrast.  CONTRAST:  152mL OMNIPAQUE IOHEXOL 300 MG/ML  SOLN  COMPARISON:  Abdominal ultrasound performed earlier today at 7:09 p.m., and CT of the abdomen and pelvis from 09/29/2014  FINDINGS: Mild bibasilar atelectasis or scarring is noted.  Mild soft inflammation is noted about the pancreas, with associated free fluid tracking about the upper abdomen and along Gerota's fascia bilaterally. Trace fluid extends into the pelvis. This is most compatible with acute pancreatitis. There is no evidence of devascularization or pseudocyst formation at this time.  The liver and spleen are unremarkable in appearance. The gallbladder is within normal limits. The adrenal glands are unremarkable.  Nonspecific perinephric stranding is noted bilaterally. The kidneys are otherwise unremarkable. There is no evidence of hydronephrosis. No renal or ureteral stones are  seen.  No free fluid is identified. The small bowel is unremarkable in appearance. The stomach is within normal limits. No acute vascular abnormalities are seen. Incidental note is made of a circumaortic left  renal vein.  The appendix is normal in caliber, without evidence of appendicitis. The colon is largely decompressed and is unremarkable in appearance.  There is diffuse atrophy of the paraspinal musculature.  The bladder is mildly distended and grossly unremarkable. No inguinal lymphadenopathy is seen.  No acute osseous abnormalities are identified. Anterior osteophytes are seen along the lower thoracic and lumbar spine.  IMPRESSION: 1. Acute pancreatitis, with mild soft tissue inflammation about the pancreas. Associated free fluid tracks about the upper abdomen and along Gerota's fascia bilaterally. Trace fluid extends into the pelvis. No evidence of devascularization or pseudocyst formation at this time. 2. Diffuse atrophy of the paraspinal musculature. 3. Incidental note of a circumaortic left renal vein. 4. Mild bibasilar atelectasis noted.   Electronically Signed   By: Garald Balding M.D.   On: 03/10/2015 23:15    Objective  Filed Vitals:   03/10/15 2230 03/10/15 2231 03/10/15 2300 03/11/15 0519  BP: 147/91 147/91 143/80 140/76  Pulse: 91 94 89 83  Temp:  97.6 F (36.4 C) 98.3 F (36.8 C) 98.1 F (36.7 C)  TempSrc:  Oral Oral Oral  Resp:  18 18 18   Height:   6\' 1"  (1.854 m)   Weight:   97.3 kg (214 lb 8.1 oz)   SpO2: 99% 99% 97% 98%    Intake/Output Summary (Last 24 hours) at 03/11/15 0751 Last data filed at 03/11/15 0748  Gross per 24 hour  Intake   1000 ml  Output    250 ml  Net    750 ml   Filed Weights   03/10/15 2300  Weight: 97.3 kg (214 lb 8.1 oz)    Exam:  General:  NAD  HEENT: no scleral icterus, PERRL  Cardiovascular: RRR without MRG, 2+ peripheral pulses, no edema  Respiratory: CTA biL, good air movement, no wheezing, no crackles, no rales  Abdomen: soft, tenderness in the epigastric area  MSK/Extremities: no clubbing/cyanosis, no joint swelling  Skin: no rashes  Neuro: non focal, strength 5/5 in all 4   Data Reviewed: Basic Metabolic  Panel:  Recent Labs Lab 03/10/15 1636 03/11/15 0513  NA 137 137  K 3.6 3.1*  CL 102 102  CO2 25 26  GLUCOSE 169* 115*  BUN 26* 23*  CREATININE 0.53* 0.53*  CALCIUM 9.3 8.3*  MG  --  2.0  PHOS  --  3.3   Liver Function Tests:  Recent Labs Lab 03/10/15 1636 03/11/15 0513  AST 328* 219*  ALT 428* 376*  ALKPHOS 81 79  BILITOT 4.4* 2.7*  PROT 7.5 6.7  ALBUMIN 4.2 3.8    Recent Labs Lab 03/10/15 1636 03/11/15 0513  LIPASE 786* 324*   No results for input(s): AMMONIA in the last 168 hours. CBC:  Recent Labs Lab 03/10/15 1636 03/11/15 0513  WBC 13.4* 18.5*  NEUTROABS 12.6*  --   HGB 17.6* 17.1*  HCT 49.1 49.6  MCV 95.3 98.6  PLT 228 186   Scheduled Meds: . antiseptic oral rinse  7 mL Mouth Rinse q12n4p  . chlorhexidine  15 mL Mouth Rinse BID  . loratadine  10 mg Oral Daily  . niacin  100 mg Oral QHS  . pantoprazole (PROTONIX) IV  40 mg Intravenous QHS   Continuous Infusions: . sodium chloride 125 mL/hr at  03/11/15 [place], MD [facility] Pager [phone]. If 7 PM - 7 AM, please contact night-coverage at [email or web address], password [facility] 03/11/2015, 7:51 AM  LOS: 1 day

## 2015-03-11 NOTE — Consult Note (Signed)
Referring Provider: No ref. provider found Primary Care Physician:  Kennon Portela, MD Primary Gastroenterologist:  Dr. Henrene Pastor  Reason for Consultation:  Pancreatitis  HPI: Travis Ellis is a 59 y.o. male who has PMH of prostate cancer, GERD, HTN, HLD.  He is known to Dr. Henrene Pastor for previous colonoscopies.  Presented to [facility] hospital with complaints of abdominal pain, nausea, and vomiting that began the day prior to admission.  Pain was in upper abdomen, sudden onset.  Upon presentation to the ED WBC count was 13.4, lipase 786, AST 328, ALT 428, ALP 81, and total bili 4.4.  Patient was admitted for acute pancreatitis.    IVF's currently running at 200 mL/hour.  Ultrasound was limited due to patient's body habitus and overlying bowel gas but no definite stones seen; small perisplenic free fluid; CBD 4mm.  CT scan of the abdomen and pelvis with contrast showed the following:  IMPRESSION: 1. Acute pancreatitis, with mild soft tissue inflammation about the pancreas. Associated free fluid tracks about the upper abdomen and along Gerota's fascia bilaterally. Trace fluid extends into the pelvis. No evidence of devascularization or pseudocyst formation at this time. 2. Diffuse atrophy of the paraspinal musculature. 3. Incidental note of a circumaortic left renal vein. 4. Mild bibasilar atelectasis noted.  Patient denies ETOH use.  No history of pancreatitis.  No new medications.  Still has pain but dilaudid is helping.  No vomiting since prior to admission.  LFT's still elevated today but trending down with AST 219, ALT 376, and total bili 2.7.  ALP has been normal.  Lipase also down to 324.  Triglycerides are normal.   Past Medical History  Diagnosis Date  . Cancer 2008    prostate  . GERD (gastroesophageal reflux disease)   . Hypertension   . Hyperlipidemia   . Arthritis   . Allergy   . Neuromuscular disorder   . Prostate cancer     [id number] adenocarcinoma of the prostate    Past  Surgical History  Procedure Laterality Date  . Replacement total knee  2010    right knee  . Prostatectomy  12/2006    cancer  . Joint replacement Right 08/2009    knee    Prior to Admission medications   Medication Sig Start Date End Date Taking? Authorizing Provider  acetaminophen (TYLENOL) 500 MG tablet Take 1,000 mg by mouth at bedtime.    Yes Historical Provider, MD  aspirin 325 MG tablet Take 325 mg by mouth daily.     Yes Historical Provider, MD  B Complex-C (B-COMPLEX WITH VITAMIN C) tablet Take 1 tablet by mouth daily.     Yes Historical Provider, MD  cetirizine (ZYRTEC) 10 MG tablet Take 10 mg by mouth daily.     Yes Historical Provider, MD  Cholecalciferol (VITAMIN D) 2000 UNITS tablet Take 2,000 Units by mouth daily.     Yes Historical Provider, MD  dicyclomine (BENTYL) 20 MG tablet Take 1 tablet (20 mg total) by mouth every 6 (six) hours as needed for spasms (for abdominal cramping). 09/29/14  Yes Linton Flemings, MD  Fish Oil-Cholecalciferol (FISH OIL + D3 PO) Take 300 mg by mouth daily.   Yes Historical Provider, MD  HYDROcodone-acetaminophen (NORCO/VICODIN) 5-325 MG per tablet Take 1 tablet by mouth every 6 (six) hours as needed for moderate pain or severe pain.   Yes Historical Provider, MD  Javier Docker Oil 300 MG CAPS Take 1 capsule by mouth daily.    Yes Historical Provider,  MD  lisinopril-hydrochlorothiazide (PRINZIDE,ZESTORETIC) 10-12.5 MG per tablet TAKE 1 TABLET BY MOUTH DAILY. 02/01/15  Yes Orma Flaming, MD  meloxicam (MOBIC) 15 MG tablet Take 1 tablet by mouth Daily. 04/05/11  Yes Historical Provider, MD  Multiple Vitamins-Minerals (MULTIVITAMIN WITH MINERALS) tablet Take 1 tablet by mouth daily.     Yes Historical Provider, MD  niacin 100 MG tablet Take 100 mg by mouth at bedtime.    Yes Historical Provider, MD  omeprazole (PRILOSEC) 20 MG capsule Take 20 mg by mouth See admin instructions. Takes 1 capsule every 3rd day   Yes Historical Provider, MD  ondansetron (ZOFRAN ODT) 8 MG  disintegrating tablet Take 1 tablet (8 mg total) by mouth every 8 (eight) hours as needed for nausea or vomiting. 09/29/14  Yes Linton Flemings, MD  simvastatin (ZOCOR) 10 MG tablet Take 10 mg by mouth daily.   Yes Historical Provider, MD  azithromycin (ZITHROMAX) 500 MG tablet Take 1 tablet (500 mg total) by mouth daily. Patient not taking: Reported on 03/10/2015 02/01/15   Orma Flaming, MD  simvastatin (ZOCOR) 20 MG tablet TAKE 1 TABLET BY MOUTH AT BEDTIME. Patient not taking: Reported on 03/10/2015 02/01/15   Orma Flaming, MD    Current Facility-Administered Medications  Medication Dose Route Frequency Provider Last Rate Last Dose  . 0.9 %  sodium chloride infusion   Intravenous Continuous Costin Karlyne Greenspan, MD      . antiseptic oral rinse (CPC / CETYLPYRIDINIUM CHLORIDE 0.05%) solution 7 mL  7 mL Mouth Rinse q12n4p Toy Baker, MD      . chlorhexidine (PERIDEX) 0.12 % solution 15 mL  15 mL Mouth Rinse BID Toy Baker, MD   15 mL at 03/11/15 0800  . HYDROmorphone (DILAUDID) injection 1 mg  1 mg Intravenous Q2H PRN Costin Karlyne Greenspan, MD      . loratadine (CLARITIN) tablet 10 mg  10 mg Oral Daily Toy Baker, MD   10 mg at 03/11/15 0926  . niacin tablet 100 mg  100 mg Oral QHS Toy Baker, MD   100 mg at 03/10/15 2352  . ondansetron (ZOFRAN) tablet 4 mg  4 mg Oral Q6H PRN Toy Baker, MD       Or  . ondansetron (ZOFRAN) injection 4 mg  4 mg Intravenous Q6H PRN Toy Baker, MD   4 mg at 03/11/15 0413  . pantoprazole (PROTONIX) injection 40 mg  40 mg Intravenous QHS Toy Baker, MD   40 mg at 03/10/15 2351    Allergies as of 03/10/2015  . (No Known Allergies)    Family History  Problem Relation Age of Onset  . Breast cancer Mother   . Cancer Mother     skin  . Heart disease Father     heart attack  . COPD Father     smoker  . Heart disease Paternal Grandfather     History   Social History  . Marital Status: Married    Spouse Name: N/A  .  Number of Children: N/A  . Years of Education: N/A   Occupational History  . Not on file.   Social History Main Topics  . Smoking status: Never Smoker   . Smokeless tobacco: Never Used  . Alcohol Use: Yes     Comment: rarely  . Drug Use: No  . Sexual Activity: Yes   Other Topics Concern  . Not on file   Social History Narrative   Exercise walking daily for 1 mile  Review of Systems: Ten point ROS is O/W negative except as mentioned in HPI.  Physical Exam: Vital signs in last 24 hours: Temp:  [97.3 F (36.3 C)-98.3 F (36.8 C)] 98.1 F (36.7 C) (07/09 0519) Pulse Rate:  [78-94] 83 (07/09 0519) Resp:  [16-18] 18 (07/09 0519) BP: (140-156)/(75-91) 140/76 mmHg (07/09 0519) SpO2:  [94 %-99 %] 98 % (07/09 0519) Weight:  [214 lb 8.1 oz (97.3 kg)] 214 lb 8.1 oz (97.3 kg) (07/08 2300)   General:  Alert, Well-developed, well-nourished, pleasant and cooperative in NAD Head:  Normocephalic and atraumatic. Eyes:  Sclera clear, no icterus.  Conjunctiva pink. Ears:  Normal auditory acuity. Mouth:  No deformity or lesions.  MM dry.   Lungs:  Clear throughout to auscultation.  No wheezes, crackles, or rhonchi.  Heart:  Regular rate and rhythm; no murmurs, clicks, rubs, or gallops. Abdomen:  Soft, non-distended.  BS present.  Mild upper abdominal TTP without R/R/G.   Rectal:  Deferred  Msk:  Symmetrical without gross deformities. Pulses:  Normal pulses noted. Extremities:  Without clubbing or edema. Neurologic:  Alert and  oriented x4;  grossly normal neurologically. Skin:  Intact without significant lesions or rashes. Psych:  Alert and cooperative. Normal mood and affect.  Intake/Output from previous day: 07/08 0701 - 07/09 0700 In: 1000 [IV Piggyback:1000] Out: 250 [Urine:250] Intake/Output this shift: Total I/O In: -  Out: 300 [Urine:300]  Lab Results:  Recent Labs  03/10/15 1636 03/11/15 0513  WBC 13.4* 18.5*  HGB 17.6* 17.1*  HCT 49.1 49.6  PLT 228 186    BMET  Recent Labs  03/10/15 1636 03/11/15 0513  NA 137 137  K 3.6 3.1*  CL 102 102  CO2 25 26  GLUCOSE 169* 115*  BUN 26* 23*  CREATININE 0.53* 0.53*  CALCIUM 9.3 8.3*   LFT  Recent Labs  03/11/15 0513  PROT 6.7  ALBUMIN 3.8  AST 219*  ALT 376*  ALKPHOS 79  BILITOT 2.7*   Studies/Results: US Abdomen Complete  03/10/2015   CLINICAL DATA:  59 year old male with possible gallstone.  EXAM: ULTRASOUND ABDOMEN COMPLETE  COMPARISON:  CT dated 09/29/2014  FINDINGS: Evaluation of the study is limited due to overlying bowel gas and patient's body habitus.  Gallbladder: No gallstones or wall thickening visualized. No sonographic Murphy sign noted.  Common bile duct: Diameter: 3 mm  Liver: Heterogeneous echotexture.  IVC: Not well visualized  Pancreas: Not seen  Spleen: Grossly unremarkable as visualized. There is a small perisplenic free fluid.  Right Kidney: Length: 12 cm.  No hydronephrosis or echogenic stone.  Left Kidney: Length: 14 cm.  Grossly unremarkable as visualized  Abdominal aorta: The proximal and distal portion of the aorta are not visualized. The midportion of the aorta appears unremarkable and measures 2 cm in diameter.  Other findings: None.  IMPRESSION: Limited study due to patient's body habitus and overlying bowel gas. No definite gallstone.  small perisplenic free fluid   Electronically Signed   By: Anner Crete M.D.   On: 03/10/2015 19:40   Ct Abdomen Pelvis W Contrast  03/10/2015   CLINICAL DATA:  Acute onset of generalized abdominal pain, with nausea and vomiting. Initial encounter.  EXAM: CT ABDOMEN AND PELVIS WITH CONTRAST  TECHNIQUE: Multidetector CT imaging of the abdomen and pelvis was performed using the standard protocol following bolus administration of intravenous contrast.  CONTRAST:  153mL OMNIPAQUE IOHEXOL 300 MG/ML  SOLN  COMPARISON:  Abdominal ultrasound performed earlier today at 7:09 p.m.,  and CT of the abdomen and pelvis from 09/29/2014  FINDINGS:  Mild bibasilar atelectasis or scarring is noted.  Mild soft inflammation is noted about the pancreas, with associated free fluid tracking about the upper abdomen and along Gerota's fascia bilaterally. Trace fluid extends into the pelvis. This is most compatible with acute pancreatitis. There is no evidence of devascularization or pseudocyst formation at this time.  The liver and spleen are unremarkable in appearance. The gallbladder is within normal limits. The adrenal glands are unremarkable.  Nonspecific perinephric stranding is noted bilaterally. The kidneys are otherwise unremarkable. There is no evidence of hydronephrosis. No renal or ureteral stones are seen.  No free fluid is identified. The small bowel is unremarkable in appearance. The stomach is within normal limits. No acute vascular abnormalities are seen. Incidental note is made of a circumaortic left renal vein.  The appendix is normal in caliber, without evidence of appendicitis. The colon is largely decompressed and is unremarkable in appearance.  There is diffuse atrophy of the paraspinal musculature.  The bladder is mildly distended and grossly unremarkable. No inguinal lymphadenopathy is seen.  No acute osseous abnormalities are identified. Anterior osteophytes are seen along the lower thoracic and lumbar spine.  IMPRESSION: 1. Acute pancreatitis, with mild soft tissue inflammation about the pancreas. Associated free fluid tracks about the upper abdomen and along Gerota's fascia bilaterally. Trace fluid extends into the pelvis. No evidence of devascularization or pseudocyst formation at this time. 2. Diffuse atrophy of the paraspinal musculature. 3. Incidental note of a circumaortic left renal vein. 4. Mild bibasilar atelectasis noted.   Electronically Signed   By: Garald Balding M.D.   On: 03/10/2015 23:15    IMPRESSION:  -Pancreatitis:  ? Source.  LFT's elevated so would suspect biliary but no evidence of stones on U/S or CT scan.  ? Passed  stone or if LFT's are elevated from inflammatory changes/reactive.  Both Zocor and lisinopril/HCTZ have potential for pancreatitis and those are being held.  ? pancreatic divisum, but would suspect that first episode would have occurred prior to now.  PLAN: -Will discuss with Dr. Henrene Pastor.  ? MRCP. ? IgG4. -Continue supportive care with aggressive IV hydration, NPO status, pain control, anti-emetics.  Monitor labs.  ZEHR, JESSICA D.  03/11/2015, 9:44 AM  Pager number [phone]  GI ATTENDING  History, laboratories, x-rays reviewed. Patient personally seen and examined. Wife in room. Agree with consultation note as outlined above.  IMPRESSION/RECOMMENDATIONS 1. Acute pancreatitis without end organ involvement. Looks comfortable and feeling better. This is most likely biliary pancreatitis as evidenced by the significant associated LFT abnormalities upon presentation. He is without evidence for other clear etiology. He does not use significant alcohol. Doubt drug, given timing. The ultrasound did not visualize the gallbladder well. CT often misses gallstones. Aside from macrolithiasis, microlithiasis can cause biliary pancreatitis. He may have passed a small stone. Agree with vigorous hydration, nothing by mouth except for ice chips, pain control, and trending pancreas enzymes as well as liver tests, BUN, and hemoglobin. If all parameters improve, I would recommend laparoscopic cholecystectomy with intraoperative cholangiogram prior to discharge. Discussed with patient and his wife. We will follow. Thank you  Docia Chuck. Geri Seminole., M.D. [facility] Division of Gastroenterology

## 2015-03-12 DIAGNOSIS — K859 Acute pancreatitis without necrosis or infection, unspecified: Secondary | ICD-10-CM | POA: Insufficient documentation

## 2015-03-12 LAB — COMPREHENSIVE METABOLIC PANEL
ALT: 216 U/L — ABNORMAL HIGH (ref 17–63)
AST: 71 U/L — AB (ref 15–41)
Albumin: 3.2 g/dL — ABNORMAL LOW (ref 3.5–5.0)
Alkaline Phosphatase: 74 U/L (ref 38–126)
Anion gap: 7 (ref 5–15)
BUN: 20 mg/dL (ref 6–20)
CALCIUM: 7.8 mg/dL — AB (ref 8.9–10.3)
CO2: 31 mmol/L (ref 22–32)
CREATININE: 0.63 mg/dL (ref 0.61–1.24)
Chloride: 103 mmol/L (ref 101–111)
Glucose, Bld: 96 mg/dL (ref 65–99)
Potassium: 3 mmol/L — ABNORMAL LOW (ref 3.5–5.1)
Sodium: 141 mmol/L (ref 135–145)
Total Bilirubin: 1.7 mg/dL — ABNORMAL HIGH (ref 0.3–1.2)
Total Protein: 6.1 g/dL — ABNORMAL LOW (ref 6.5–8.1)

## 2015-03-12 LAB — HEPATITIS PANEL, ACUTE
HCV Ab: <0.1 s/co ratio (ref 0.0–0.9)
HEP A IGM: NEGATIVE
HEP B C IGM: NEGATIVE
Hepatitis B Surface Ag: NEGATIVE

## 2015-03-12 LAB — CBC
HEMATOCRIT: 46.4 % (ref 39.0–52.0)
HEMOGLOBIN: 15.5 g/dL (ref 13.0–17.0)
MCH: 33.7 pg (ref 26.0–34.0)
MCHC: 33.4 g/dL (ref 30.0–36.0)
MCV: 100.9 fL — AB (ref 78.0–100.0)
Platelets: 204 10*3/uL (ref 150–400)
RBC: 4.6 MIL/uL (ref 4.22–5.81)
RDW: 12.9 % (ref 11.5–15.5)
WBC: 22.8 10*3/uL — AB (ref 4.0–10.5)

## 2015-03-12 LAB — LIPASE, BLOOD: Lipase: 96 U/L — ABNORMAL HIGH (ref 22–51)

## 2015-03-12 MED ORDER — AMPICILLIN-SULBACTAM SODIUM 3 (2-1) G IJ SOLR
3.0000 g | Freq: Four times a day (QID) | INTRAMUSCULAR | Status: DC
  Administered 2015-03-12 – 2015-03-15 (×12): 3 g via INTRAVENOUS
  Filled 2015-03-12 (×13): qty 3

## 2015-03-12 MED ORDER — POTASSIUM CHLORIDE 10 MEQ/100ML IV SOLN
10.0000 meq | INTRAVENOUS | Status: AC
  Administered 2015-03-12 (×5): 10 meq via INTRAVENOUS
  Filled 2015-03-12 (×5): qty 100

## 2015-03-12 NOTE — Consult Note (Signed)
Reason for Consult:Pancreatitis Referring Physician: Dr Rhetta Mura  Travis Ellis is an 59 y.o. male.  HPI: 59 y.o. M who presented to [facility] hospital with complaints of abdominal pain, nausea, and vomiting that began the day prior to admission. Pain was in upper abdomen, sudden onset. Upon presentation to the ED WBC count was 13.4, lipase 786, AST 328, ALT 428, ALP 81, and total bili 4.4. Patient was admitted for acute pancreatitis. RUQ US showed no gallstones or wall thickening and no sonographic Murphy sign noted. CBD diameter: 3 mm.  CT showed pancreatitis.  GI was consulted.  Patient denies ETOH use. No history of pancreatitis. No new medications.Triglycerides are normal.  Dr Henrene Pastor recommended lap chole with IOC.  Past Medical History  Diagnosis Date  . Cancer 2008    prostate  . GERD (gastroesophageal reflux disease)   . Hypertension   . Hyperlipidemia   . Arthritis   . Allergy   . Neuromuscular disorder   . Prostate cancer     [id number] adenocarcinoma of the prostate    Past Surgical History  Procedure Laterality Date  . Replacement total knee  2010    right knee  . Prostatectomy  12/2006    cancer  . Joint replacement Right 08/2009    knee    Family History  Problem Relation Age of Onset  . Breast cancer Mother   . Cancer Mother     skin  . Heart disease Father     heart attack  . COPD Father     smoker  . Heart disease Paternal Grandfather     Social History:  reports that he has never smoked. He has never used smokeless tobacco. He reports that he drinks alcohol. He reports that he does not use illicit drugs.  Allergies: No Known Allergies  Medications: I have reviewed the patient's current medications.  Results for orders placed or performed during the hospital encounter of 03/10/15 (from the past 48 hour(s))  Comprehensive metabolic panel     Status: Abnormal   Collection Time: 03/10/15  4:36 PM  Result Value Ref Range   Sodium 137 135 - 145 mmol/L    Potassium 3.6 3.5 - 5.1 mmol/L   Chloride 102 101 - 111 mmol/L   CO2 25 22 - 32 mmol/L   Glucose, Bld 169 (H) 65 - 99 mg/dL   BUN 26 (H) 6 - 20 mg/dL   Creatinine, Ser 0.53 (L) 0.61 - 1.24 mg/dL   Calcium 9.3 8.9 - 10.3 mg/dL   Total Protein 7.5 6.5 - 8.1 g/dL   Albumin 4.2 3.5 - 5.0 g/dL   AST 328 (H) 15 - 41 U/L   ALT 428 (H) 17 - 63 U/L   Alkaline Phosphatase 81 38 - 126 U/L   Total Bilirubin 4.4 (H) 0.3 - 1.2 mg/dL   GFR calc non Af Amer >60 >60 mL/min   GFR calc Af Amer >60 >60 mL/min    Comment: (NOTE) The eGFR has been calculated using the CKD EPI equation. This calculation has not been validated in all clinical situations. eGFR's persistently <60 mL/min signify possible Chronic Kidney Disease.    Anion gap 10 5 - 15  CBC with Differential     Status: Abnormal   Collection Time: 03/10/15  4:36 PM  Result Value Ref Range   WBC 13.4 (H) 4.0 - 10.5 K/uL   RBC 5.15 4.22 - 5.81 MIL/uL   Hemoglobin 17.6 (H) 13.0 - 17.0 g/dL   HCT  49.1 39.0 - 52.0 %   MCV 95.3 78.0 - 100.0 fL   MCH 34.2 (H) 26.0 - 34.0 pg   MCHC 35.8 30.0 - 36.0 g/dL   RDW 11.8 11.5 - 15.5 %   Platelets 228 150 - 400 K/uL   Neutrophils Relative % 94 (H) 43 - 77 %   Neutro Abs 12.6 (H) 1.7 - 7.7 K/uL   Lymphocytes Relative 2 (L) 12 - 46 %   Lymphs Abs 0.3 (L) 0.7 - 4.0 K/uL   Monocytes Relative 4 3 - 12 %   Monocytes Absolute 0.5 0.1 - 1.0 K/uL   Eosinophils Relative 0 0 - 5 %   Eosinophils Absolute 0.0 0.0 - 0.7 K/uL   Basophils Relative 0 0 - 1 %   Basophils Absolute 0.0 0.0 - 0.1 K/uL  Lipase, blood     Status: Abnormal   Collection Time: 03/10/15  4:36 PM  Result Value Ref Range   Lipase 786 (H) 22 - 51 U/L    Comment: REPEATED TO VERIFY RESULTS CONFIRMED BY MANUAL DILUTION   Urinalysis, Routine w reflex microscopic (not at [facility])     Status: Abnormal   Collection Time: 03/10/15  5:32 PM  Result Value Ref Range   Color, Urine AMBER (A) YELLOW    Comment: BIOCHEMICALS MAY BE AFFECTED BY COLOR    APPearance CLEAR CLEAR   Specific Gravity, Urine 1.021 1.005 - 1.030   pH 6.5 5.0 - 8.0   Glucose, UA NEGATIVE NEGATIVE mg/dL   Hgb urine dipstick NEGATIVE NEGATIVE   Bilirubin Urine MODERATE (A) NEGATIVE   Ketones, ur NEGATIVE NEGATIVE mg/dL   Protein, ur NEGATIVE NEGATIVE mg/dL   Urobilinogen, UA 4.0 (H) 0.0 - 1.0 mg/dL   Nitrite NEGATIVE NEGATIVE   Leukocytes, UA SMALL (A) NEGATIVE  Urine microscopic-add on     Status: None   Collection Time: 03/10/15  5:32 PM  Result Value Ref Range   Squamous Epithelial / LPF RARE RARE   WBC, UA 0-2 <3 WBC/hpf   RBC / HPF 3-6 <3 RBC/hpf   Urine-Other MUCOUS PRESENT   Acetaminophen level     Status: Abnormal   Collection Time: 03/10/15 10:18 PM  Result Value Ref Range   Acetaminophen (Tylenol), Serum <10 (L) 10 - 30 ug/mL    Comment:        THERAPEUTIC CONCENTRATIONS VARY SIGNIFICANTLY. A RANGE OF 10-30 ug/mL MAY BE AN EFFECTIVE CONCENTRATION FOR MANY PATIENTS. HOWEVER, SOME ARE BEST TREATED AT CONCENTRATIONS OUTSIDE THIS RANGE. ACETAMINOPHEN CONCENTRATIONS >150 ug/mL AT 4 HOURS AFTER INGESTION AND >50 ug/mL AT 12 HOURS AFTER INGESTION ARE OFTEN ASSOCIATED WITH TOXIC REACTIONS.   Hepatitis panel, acute     Status: None   Collection Time: 03/10/15 10:18 PM  Result Value Ref Range   Hepatitis B Surface Ag Negative Negative   HCV Ab <0.1 0.0 - 0.9 s/co ratio    Comment: (NOTE)                                  Negative:     < 0.8                             Indeterminate: 0.8 - 0.9  Positive:     > 0.9 The CDC recommends that a positive HCV antibody result be followed up with a HCV Nucleic Acid Amplification test (562130). Performed At: [facility] [place], [place] [id number] Lindon Romp MD EX:[id number]    Hep A IgM Negative Negative   Hep B C IgM Negative Negative  Magnesium     Status: None   Collection Time: 03/11/15  5:13 AM  Result Value Ref Range    Magnesium 2.0 1.7 - 2.4 mg/dL  Phosphorus     Status: None   Collection Time: 03/11/15  5:13 AM  Result Value Ref Range   Phosphorus 3.3 2.5 - 4.6 mg/dL  TSH     Status: None   Collection Time: 03/11/15  5:13 AM  Result Value Ref Range   TSH 0.391 0.350 - 4.500 uIU/mL  Comprehensive metabolic panel     Status: Abnormal   Collection Time: 03/11/15  5:13 AM  Result Value Ref Range   Sodium 137 135 - 145 mmol/L   Potassium 3.1 (L) 3.5 - 5.1 mmol/L   Chloride 102 101 - 111 mmol/L   CO2 26 22 - 32 mmol/L   Glucose, Bld 115 (H) 65 - 99 mg/dL   BUN 23 (H) 6 - 20 mg/dL   Creatinine, Ser 0.53 (L) 0.61 - 1.24 mg/dL   Calcium 8.3 (L) 8.9 - 10.3 mg/dL   Total Protein 6.7 6.5 - 8.1 g/dL   Albumin 3.8 3.5 - 5.0 g/dL   AST 219 (H) 15 - 41 U/L   ALT 376 (H) 17 - 63 U/L   Alkaline Phosphatase 79 38 - 126 U/L   Total Bilirubin 2.7 (H) 0.3 - 1.2 mg/dL   GFR calc non Af Amer >60 >60 mL/min   GFR calc Af Amer >60 >60 mL/min    Comment: (NOTE) The eGFR has been calculated using the CKD EPI equation. This calculation has not been validated in all clinical situations. eGFR's persistently <60 mL/min signify possible Chronic Kidney Disease.    Anion gap 9 5 - 15  CBC     Status: Abnormal   Collection Time: 03/11/15  5:13 AM  Result Value Ref Range   WBC 18.5 (H) 4.0 - 10.5 K/uL   RBC 5.03 4.22 - 5.81 MIL/uL   Hemoglobin 17.1 (H) 13.0 - 17.0 g/dL   HCT 49.6 39.0 - 52.0 %   MCV 98.6 78.0 - 100.0 fL   MCH 34.0 26.0 - 34.0 pg   MCHC 34.5 30.0 - 36.0 g/dL   RDW 12.4 11.5 - 15.5 %   Platelets 186 150 - 400 K/uL  Lipid panel     Status: Abnormal   Collection Time: 03/11/15  5:13 AM  Result Value Ref Range   Cholesterol 130 0 - 200 mg/dL   Triglycerides 81 <150 mg/dL   HDL 37 (L) >40 mg/dL   Total CHOL/HDL Ratio 3.5 RATIO   VLDL 16 0 - 40 mg/dL   LDL Cholesterol 77 0 - 99 mg/dL    Comment:        Total Cholesterol/HDL:CHD Risk Coronary Heart Disease Risk Table                     Men    Women  1/2 Average Risk   3.4   3.3  Average Risk       5.0   4.4  2 X Average Risk   9.6   7.1  3 X  Average Risk  23.4   11.0        Use the calculated Patient Ratio above and the CHD Risk Table to determine the patient's CHD Risk.        ATP III CLASSIFICATION (LDL):  <100     mg/dL   Optimal  100-129  mg/dL   Near or Above                    Optimal  130-159  mg/dL   Borderline  160-189  mg/dL   High  >190     mg/dL   Very High Performed at [facility]   Lipase, blood     Status: Abnormal   Collection Time: 03/11/15  5:13 AM  Result Value Ref Range   Lipase 324 (H) 22 - 51 U/L  Comprehensive metabolic panel     Status: Abnormal   Collection Time: 03/12/15  5:16 AM  Result Value Ref Range   Sodium 141 135 - 145 mmol/L   Potassium 3.0 (L) 3.5 - 5.1 mmol/L   Chloride 103 101 - 111 mmol/L   CO2 31 22 - 32 mmol/L   Glucose, Bld 96 65 - 99 mg/dL   BUN 20 6 - 20 mg/dL   Creatinine, Ser 0.63 0.61 - 1.24 mg/dL   Calcium 7.8 (L) 8.9 - 10.3 mg/dL   Total Protein 6.1 (L) 6.5 - 8.1 g/dL   Albumin 3.2 (L) 3.5 - 5.0 g/dL   AST 71 (H) 15 - 41 U/L   ALT 216 (H) 17 - 63 U/L   Alkaline Phosphatase 74 38 - 126 U/L   Total Bilirubin 1.7 (H) 0.3 - 1.2 mg/dL   GFR calc non Af Amer >60 >60 mL/min   GFR calc Af Amer >60 >60 mL/min    Comment: (NOTE) The eGFR has been calculated using the CKD EPI equation. This calculation has not been validated in all clinical situations. eGFR's persistently <60 mL/min signify possible Chronic Kidney Disease.    Anion gap 7 5 - 15  CBC     Status: Abnormal   Collection Time: 03/12/15  5:16 AM  Result Value Ref Range   WBC 22.8 (H) 4.0 - 10.5 K/uL   RBC 4.60 4.22 - 5.81 MIL/uL   Hemoglobin 15.5 13.0 - 17.0 g/dL   HCT 46.4 39.0 - 52.0 %   MCV 100.9 (H) 78.0 - 100.0 fL   MCH 33.7 26.0 - 34.0 pg   MCHC 33.4 30.0 - 36.0 g/dL   RDW 12.9 11.5 - 15.5 %   Platelets 204 150 - 400 K/uL  Lipase, blood     Status: Abnormal   Collection Time:  03/12/15  5:16 AM  Result Value Ref Range   Lipase 96 (H) 22 - 51 U/L    US Abdomen Complete  03/10/2015   CLINICAL DATA:  59 year old male with possible gallstone.  EXAM: ULTRASOUND ABDOMEN COMPLETE  COMPARISON:  CT dated 09/29/2014  FINDINGS: Evaluation of the study is limited due to overlying bowel gas and patient's body habitus.  Gallbladder: No gallstones or wall thickening visualized. No sonographic Murphy sign noted.  Common bile duct: Diameter: 3 mm  Liver: Heterogeneous echotexture.  IVC: Not well visualized  Pancreas: Not seen  Spleen: Grossly unremarkable as visualized. There is a small perisplenic free fluid.  Right Kidney: Length: 12 cm.  No hydronephrosis or echogenic stone.  Left Kidney: Length: 14 cm.  Grossly unremarkable as visualized  Abdominal aorta: The proximal and  distal portion of the aorta are not visualized. The midportion of the aorta appears unremarkable and measures 2 cm in diameter.  Other findings: None.  IMPRESSION: Limited study due to patient's body habitus and overlying bowel gas. No definite gallstone.  small perisplenic free fluid   Electronically Signed   By: Anner Crete M.D.   On: 03/10/2015 19:40   Ct Abdomen Pelvis W Contrast  03/10/2015   CLINICAL DATA:  Acute onset of generalized abdominal pain, with nausea and vomiting. Initial encounter.  EXAM: CT ABDOMEN AND PELVIS WITH CONTRAST  TECHNIQUE: Multidetector CT imaging of the abdomen and pelvis was performed using the standard protocol following bolus administration of intravenous contrast.  CONTRAST:  131m OMNIPAQUE IOHEXOL 300 MG/ML  SOLN  COMPARISON:  Abdominal ultrasound performed earlier today at 7:09 p.m., and CT of the abdomen and pelvis from 09/29/2014  FINDINGS: Mild bibasilar atelectasis or scarring is noted.  Mild soft inflammation is noted about the pancreas, with associated free fluid tracking about the upper abdomen and along Gerota's fascia bilaterally. Trace fluid extends into the pelvis. This is  most compatible with acute pancreatitis. There is no evidence of devascularization or pseudocyst formation at this time.  The liver and spleen are unremarkable in appearance. The gallbladder is within normal limits. The adrenal glands are unremarkable.  Nonspecific perinephric stranding is noted bilaterally. The kidneys are otherwise unremarkable. There is no evidence of hydronephrosis. No renal or ureteral stones are seen.  No free fluid is identified. The small bowel is unremarkable in appearance. The stomach is within normal limits. No acute vascular abnormalities are seen. Incidental note is made of a circumaortic left renal vein.  The appendix is normal in caliber, without evidence of appendicitis. The colon is largely decompressed and is unremarkable in appearance.  There is diffuse atrophy of the paraspinal musculature.  The bladder is mildly distended and grossly unremarkable. No inguinal lymphadenopathy is seen.  No acute osseous abnormalities are identified. Anterior osteophytes are seen along the lower thoracic and lumbar spine.  IMPRESSION: 1. Acute pancreatitis, with mild soft tissue inflammation about the pancreas. Associated free fluid tracks about the upper abdomen and along Gerota's fascia bilaterally. Trace fluid extends into the pelvis. No evidence of devascularization or pseudocyst formation at this time. 2. Diffuse atrophy of the paraspinal musculature. 3. Incidental note of a circumaortic left renal vein. 4. Mild bibasilar atelectasis noted.   Electronically Signed   By: JGarald BaldingM.D.   On: 03/10/2015 23:15    Review of Systems  Constitutional: Negative for fever, chills and weight loss.  HENT: Negative for sore throat.   Eyes: Negative for blurred vision and double vision.  Respiratory: Negative for cough and shortness of breath.   Cardiovascular: Negative for chest pain and palpitations.  Gastrointestinal: Positive for nausea and abdominal pain. Negative for vomiting, diarrhea,  constipation and blood in stool.  Genitourinary: Negative for dysuria, urgency and frequency.  Musculoskeletal: Negative for myalgias and back pain.  Skin: Negative for rash.  Neurological: Negative for dizziness and headaches.  Endo/Heme/Allergies: Does not bruise/bleed easily.   Blood pressure 156/71, pulse 101, temperature 97.4 F (36.3 C), temperature source Oral, resp. rate 18, height 6' 1"  (1.854 m), weight 97.3 kg (214 lb 8.1 oz), SpO2 90 %. Physical Exam  Constitutional: He is oriented to person, place, and time. He appears well-developed and well-nourished. No distress.  HENT:  Head: Normocephalic and atraumatic.  Eyes: Conjunctivae and EOM are normal. Pupils are equal, round, and reactive  to light.  Neck: Normal range of motion. Neck supple. No tracheal deviation present. No thyromegaly present.  Cardiovascular: Normal rate and regular rhythm.  Exam reveals no friction rub.   No murmur heard. Respiratory: Effort normal and breath sounds normal. No respiratory distress.  GI: Soft. He exhibits distension. There is tenderness. There is no rebound and no guarding.  Mid epigastric tenderness to palpation, no RUQ tenderness  Musculoskeletal: Normal range of motion.  Neurological: He is alert and oriented to person, place, and time.  Skin: Skin is warm and dry. He is not diaphoretic.    Assessment/Plan: Pt with signs and symptoms consistent with gallstone pancreatitis.  RUQ did not show stones but limited due to body habitus.  No other major risk factors for pancreatitis.  Agree with GI recommendation for cholecystectomy/IOC.  Pt's bilirubin and lipase trending down, pain improving.  Will make NPO p MN and recheck labs in AM.  If still improving, will plan on lap chole tomorrow.  Unasyn started today for rising wbc, unclear etiology, as pt has no RUQ pain and pancreatitis seems to be improving.   Teven Mittman C. 7/54/2370, 23:01 AM

## 2015-03-12 NOTE — Progress Notes (Signed)
[facility] PROGRESS NOTE  Travis Ellis OZH:[id number] DOB: [birth year] DOA: 03/10/2015 PCP: Kennon Portela, MD  Assessment/Plan: Acute pancreatitis - Suspect secondary to transient biliary obstruction, possibly from stone that may have already passed - Followed by GI, recs for lap chole prior to d/c - Have discussed with General Surgery, who agrees with lap chole - Diet advanced to clears today, NPO after midnight  Leukocytosis - Initially thought to be reactive - Also consider possible early acute cholecystitis? - Have started empiric unasyn - Follow CBC  LFT elevation - improving, continue to monitor  HTN - holding home medications for now, blood pressure stable  HLD - on statin  Code Status: Full Family Communication: Pt in room, family at bedside (indicate person spoken with, relationship, and if by phone, the number) Disposition Plan: Pending   Consultants:  GI  General Surgery  Procedures:    Antibiotics:  Unasyn 7/10>>>  HPI/Subjective: Feels better today. Eager to have gallbladder surgery  Objective: Filed Vitals:   03/11/15 1404 03/11/15 2203 03/12/15 0216 03/12/15 0541  BP: 149/81 141/73 151/73 156/71  Pulse: 97 88 99 101  Temp: 98 F (36.7 C) 98.1 F (36.7 C) 97.5 F (36.4 C) 97.4 F (36.3 C)  TempSrc: Oral Oral Axillary Oral  Resp: 18 18 18 18   Height:      Weight:      SpO2: 97% 96% 95% 90%    Intake/Output Summary (Last 24 hours) at 03/12/15 1442 Last data filed at 03/12/15 1100  Gross per 24 hour  Intake   2200 ml  Output   1400 ml  Net    800 ml   Filed Weights   03/10/15 2300  Weight: 97.3 kg (214 lb 8.1 oz)    Exam:   General:  Awake, in nad  Cardiovascular: regular, s1, s2  Respiratory: normal resp effort, no wheezing  Abdomen: soft, nondistended  Musculoskeletal: perfused, no clubbing   Data Reviewed: Basic Metabolic Panel:  Recent Labs Lab 03/10/15 1636 03/11/15 0513 03/12/15 0516  NA  137 137 141  K 3.6 3.1* 3.0*  CL 102 102 103  CO2 25 26 31   GLUCOSE 169* 115* 96  BUN 26* 23* 20  CREATININE 0.53* 0.53* 0.63  CALCIUM 9.3 8.3* 7.8*  MG  --  2.0  --   PHOS  --  3.3  --    Liver Function Tests:  Recent Labs Lab 03/10/15 1636 03/11/15 0513 03/12/15 0516  AST 328* 219* 71*  ALT 428* 376* 216*  ALKPHOS 81 79 74  BILITOT 4.4* 2.7* 1.7*  PROT 7.5 6.7 6.1*  ALBUMIN 4.2 3.8 3.2*    Recent Labs Lab 03/10/15 1636 03/11/15 0513 03/12/15 0516  LIPASE 786* 324* 96*   No results for input(s): AMMONIA in the last 168 hours. CBC:  Recent Labs Lab 03/10/15 1636 03/11/15 0513 03/12/15 0516  WBC 13.4* 18.5* 22.8*  NEUTROABS 12.6*  --   --   HGB 17.6* 17.1* 15.5  HCT 49.1 49.6 46.4  MCV 95.3 98.6 100.9*  PLT 228 186 204   Cardiac Enzymes: No results for input(s): CKTOTAL, CKMB, CKMBINDEX, TROPONINI in the last 168 hours. BNP (last 3 results) No results for input(s): BNP in the last 8760 hours.  ProBNP (last 3 results) No results for input(s): PROBNP in the last 8760 hours.  CBG: No results for input(s): GLUCAP in the last 168 hours.  No results found for this or any previous visit (from the past 240 hour(s)).  Studies: US Abdomen Complete  03/10/2015   CLINICAL DATA:  59 year old male with possible gallstone.  EXAM: ULTRASOUND ABDOMEN COMPLETE  COMPARISON:  CT dated 09/29/2014  FINDINGS: Evaluation of the study is limited due to overlying bowel gas and patient's body habitus.  Gallbladder: No gallstones or wall thickening visualized. No sonographic Murphy sign noted.  Common bile duct: Diameter: 3 mm  Liver: Heterogeneous echotexture.  IVC: Not well visualized  Pancreas: Not seen  Spleen: Grossly unremarkable as visualized. There is a small perisplenic free fluid.  Right Kidney: Length: 12 cm.  No hydronephrosis or echogenic stone.  Left Kidney: Length: 14 cm.  Grossly unremarkable as visualized  Abdominal aorta: The proximal and distal portion of the  aorta are not visualized. The midportion of the aorta appears unremarkable and measures 2 cm in diameter.  Other findings: None.  IMPRESSION: Limited study due to patient's body habitus and overlying bowel gas. No definite gallstone.  small perisplenic free fluid   Electronically Signed   By: Anner Crete M.D.   On: 03/10/2015 19:40   Ct Abdomen Pelvis W Contrast  03/10/2015   CLINICAL DATA:  Acute onset of generalized abdominal pain, with nausea and vomiting. Initial encounter.  EXAM: CT ABDOMEN AND PELVIS WITH CONTRAST  TECHNIQUE: Multidetector CT imaging of the abdomen and pelvis was performed using the standard protocol following bolus administration of intravenous contrast.  CONTRAST:  18mL OMNIPAQUE IOHEXOL 300 MG/ML  SOLN  COMPARISON:  Abdominal ultrasound performed earlier today at 7:09 p.m., and CT of the abdomen and pelvis from 09/29/2014  FINDINGS: Mild bibasilar atelectasis or scarring is noted.  Mild soft inflammation is noted about the pancreas, with associated free fluid tracking about the upper abdomen and along Gerota's fascia bilaterally. Trace fluid extends into the pelvis. This is most compatible with acute pancreatitis. There is no evidence of devascularization or pseudocyst formation at this time.  The liver and spleen are unremarkable in appearance. The gallbladder is within normal limits. The adrenal glands are unremarkable.  Nonspecific perinephric stranding is noted bilaterally. The kidneys are otherwise unremarkable. There is no evidence of hydronephrosis. No renal or ureteral stones are seen.  No free fluid is identified. The small bowel is unremarkable in appearance. The stomach is within normal limits. No acute vascular abnormalities are seen. Incidental note is made of a circumaortic left renal vein.  The appendix is normal in caliber, without evidence of appendicitis. The colon is largely decompressed and is unremarkable in appearance.  There is diffuse atrophy of the  paraspinal musculature.  The bladder is mildly distended and grossly unremarkable. No inguinal lymphadenopathy is seen.  No acute osseous abnormalities are identified. Anterior osteophytes are seen along the lower thoracic and lumbar spine.  IMPRESSION: 1. Acute pancreatitis, with mild soft tissue inflammation about the pancreas. Associated free fluid tracks about the upper abdomen and along Gerota's fascia bilaterally. Trace fluid extends into the pelvis. No evidence of devascularization or pseudocyst formation at this time. 2. Diffuse atrophy of the paraspinal musculature. 3. Incidental note of a circumaortic left renal vein. 4. Mild bibasilar atelectasis noted.   Electronically Signed   By: Garald Balding M.D.   On: 03/10/2015 23:15    Scheduled Meds: . ampicillin-sulbactam (UNASYN) IV  3 g Intravenous 4 times per day  . antiseptic oral rinse  7 mL Mouth Rinse q12n4p  . chlorhexidine  15 mL Mouth Rinse BID  . loratadine  10 mg Oral Daily  . niacin  100 mg Oral QHS  .  pantoprazole (PROTONIX) IV  40 mg Intravenous QHS   Continuous Infusions:   Active Problems:   HTN (hypertension)   Pancreatitis   LFT elevation   Leukocytosis   CHIU, STEPHEN K  [facility] Pager [phone]. If 7PM-7AM, please contact night-coverage at [email or web address], password [facility] 03/12/2015, 2:42 PM  LOS: 2 days

## 2015-03-12 NOTE — Progress Notes (Signed)
ANTIBIOTIC CONSULT NOTE - INITIAL  Pharmacy Consult for Ampicillin/Sulbactam Indication: Intra-abdominal infection  No Known Allergies  Patient Measurements: Height: 6\' 1"  (185.4 cm) Weight: 214 lb 8.1 oz (97.3 kg) IBW/kg (Calculated) : 79.9  Vital Signs: Temp: 97.4 F (36.3 C) (07/10 0541) Temp Source: Oral (07/10 0541) BP: 156/71 mmHg (07/10 0541) Pulse Rate: 101 (07/10 0541) Intake/Output from previous day: 07/09 0701 - 07/10 0700 In: 2200 [I.V.:2200] Out: 1300 [Urine:1300] Intake/Output from this shift:    Labs:  Recent Labs  03/10/15 1636 03/11/15 0513 03/12/15 0516  WBC 13.4* 18.5* 22.8*  HGB 17.6* 17.1* 15.5  PLT 228 186 204  CREATININE 0.53* 0.53* 0.63   Estimated Creatinine Clearance: 120.7 mL/min (by C-G formula based on Cr of 0.63). No results for input(s): VANCOTROUGH, VANCOPEAK, VANCORANDOM, GENTTROUGH, GENTPEAK, GENTRANDOM, TOBRATROUGH, TOBRAPEAK, TOBRARND, AMIKACINPEAK, AMIKACINTROU, AMIKACIN in the last 72 hours.   Microbiology: No results found for this or any previous visit (from the past 720 hour(s)).  Medical History: Past Medical History  Diagnosis Date  . Cancer 2008    prostate  . GERD (gastroesophageal reflux disease)   . Hypertension   . Hyperlipidemia   . Arthritis   . Allergy   . Neuromuscular disorder   . Prostate cancer     [id number] adenocarcinoma of the prostate     Assessment: 66 y/oM with PMH of prostate cancer, GERD, HTN, HLD who presented to [facility] ED on 7/8 with complaints of upper abdominal pain, n/v for 24 hours. WBC and LFTs elevated on initial presentation. CT scan of abdomen and pelvis showed acute pancreatitis. LFTs have been trending down, but WBC trending up. Pharmacy consulted today to dose Ampicillin/Sulbactam for intra-abdominal infection.  7/10 >> Ampicillin/Sulbactam >>    No cultures drawn Afebrile WBC 13.4 > 18.5 > 22.8 SCr 0.63, CrCl > 100 ml/min  Goal of Therapy:  Appropriate antibiotic dosing for  renal function and indication Eradication of infection  Plan:   Ampicillin/Sulbactam 3g IV q6h.  Do not anticipate any further dose adjustments as renal function is normal, stable.  Will sign off, but follow clinical course peripherally.  Thank you for the consult.   Lindell Spar, PharmD, BCPS Pager: [phone] 03/12/2015 10:30 AM

## 2015-03-12 NOTE — Progress Notes (Signed)
[facility] Progress Note  Subjective:  Feels better than yesterday, but still getting good amount of pain meds.  Would like to try some clear liquids today.  Objective:  Vital signs in last 24 hours: Temp:  [97.4 F (36.3 C)-98.1 F (36.7 C)] 97.4 F (36.3 C) (07/10 0541) Pulse Rate:  [88-101] 101 (07/10 0541) Resp:  [18] 18 (07/10 0541) BP: (141-156)/(71-81) 156/71 mmHg (07/10 0541) SpO2:  [90 %-97 %] 90 % (07/10 0541)   General:  Alert, Well-developed, in NAD Heart:  Regular rate and rhythm; no murmurs Pulm:  CTAB.  No W/R/R. Abdomen:  Soft, non-distended.  BS present.  Upper abdominal TTP without R/R/G. Extremities:  Without edema. Neurologic:  Alert and oriented x4;  grossly normal neurologically. Psych:  Alert and cooperative. Normal mood and affect.  Intake/Output from previous day: 07/09 0701 - 07/10 0700 In: 2200 [I.V.:2200] Out: 1300 [Urine:1300]  Lab Results:  Recent Labs  03/10/15 1636 03/11/15 0513 03/12/15 0516  WBC 13.4* 18.5* 22.8*  HGB 17.6* 17.1* 15.5  HCT 49.1 49.6 46.4  PLT 228 186 204   BMET  Recent Labs  03/10/15 1636 03/11/15 0513 03/12/15 0516  NA 137 137 141  K 3.6 3.1* 3.0*  CL 102 102 103  CO2 25 26 31   GLUCOSE 169* 115* 96  BUN 26* 23* 20  CREATININE 0.53* 0.53* 0.63  CALCIUM 9.3 8.3* 7.8*   LFT  Recent Labs  03/12/15 0516  PROT 6.1*  ALBUMIN 3.2*  AST 71*  ALT 216*  ALKPHOS 74  BILITOT 1.7*   Hepatitis Panel  Recent Labs  03/10/15 2218  HEPBSAG Negative  HCVAB <0.1  HEPAIGM Negative  HEPBIGM Negative    US Abdomen Complete  03/10/2015   CLINICAL DATA:  59 year old male with possible gallstone.  EXAM: ULTRASOUND ABDOMEN COMPLETE  COMPARISON:  CT dated 09/29/2014  FINDINGS: Evaluation of the study is limited due to overlying bowel gas and patient's body habitus.  Gallbladder: No gallstones or wall thickening visualized. No sonographic Murphy sign noted.  Common bile duct: Diameter: 3 mm   Liver: Heterogeneous echotexture.  IVC: Not well visualized  Pancreas: Not seen  Spleen: Grossly unremarkable as visualized. There is a small perisplenic free fluid.  Right Kidney: Length: 12 cm.  No hydronephrosis or echogenic stone.  Left Kidney: Length: 14 cm.  Grossly unremarkable as visualized  Abdominal aorta: The proximal and distal portion of the aorta are not visualized. The midportion of the aorta appears unremarkable and measures 2 cm in diameter.  Other findings: None.  IMPRESSION: Limited study due to patient's body habitus and overlying bowel gas. No definite gallstone.  small perisplenic free fluid   Electronically Signed   By: Anner Crete M.D.   On: 03/10/2015 19:40   Ct Abdomen Pelvis W Contrast  03/10/2015   CLINICAL DATA:  Acute onset of generalized abdominal pain, with nausea and vomiting. Initial encounter.  EXAM: CT ABDOMEN AND PELVIS WITH CONTRAST  TECHNIQUE: Multidetector CT imaging of the abdomen and pelvis was performed using the standard protocol following bolus administration of intravenous contrast.  CONTRAST:  149mL OMNIPAQUE IOHEXOL 300 MG/ML  SOLN  COMPARISON:  Abdominal ultrasound performed earlier today at 7:09 p.m., and CT of the abdomen and pelvis from 09/29/2014  FINDINGS: Mild bibasilar atelectasis or scarring is noted.  Mild soft inflammation is noted about the pancreas, with associated free fluid tracking about the upper abdomen and along Gerota's fascia bilaterally. Trace fluid extends into the pelvis.  This is most compatible with acute pancreatitis. There is no evidence of devascularization or pseudocyst formation at this time.  The liver and spleen are unremarkable in appearance. The gallbladder is within normal limits. The adrenal glands are unremarkable.  Nonspecific perinephric stranding is noted bilaterally. The kidneys are otherwise unremarkable. There is no evidence of hydronephrosis. No renal or ureteral stones are seen.  No free fluid is identified. The small  bowel is unremarkable in appearance. The stomach is within normal limits. No acute vascular abnormalities are seen. Incidental note is made of a circumaortic left renal vein.  The appendix is normal in caliber, without evidence of appendicitis. The colon is largely decompressed and is unremarkable in appearance.  There is diffuse atrophy of the paraspinal musculature.  The bladder is mildly distended and grossly unremarkable. No inguinal lymphadenopathy is seen.  No acute osseous abnormalities are identified. Anterior osteophytes are seen along the lower thoracic and lumbar spine.  IMPRESSION: 1. Acute pancreatitis, with mild soft tissue inflammation about the pancreas. Associated free fluid tracks about the upper abdomen and along Gerota's fascia bilaterally. Trace fluid extends into the pelvis. No evidence of devascularization or pseudocyst formation at this time. 2. Diffuse atrophy of the paraspinal musculature. 3. Incidental note of a circumaortic left renal vein. 4. Mild bibasilar atelectasis noted.   Electronically Signed   By: Garald Balding M.D.   On: 03/10/2015 23:15    Assessment / Plan: -Pancreatitis: Suspect biliary source (? Passed gallstone) with elevated LFT's.  They are trending down as is his lipase.  *Continue supportive care with aggressive IV hydration, pain control, anti-emetics. Monitor labs.  Patient would like to try some clear liquids today. *Dr. Henrene Pastor recommends lap chole with IOC prior to discharge.  Would obtain CCS consult.   LOS: 2 days   ZEHR, JESSICA D.  03/12/2015, 8:57 AM  Pager number [phone]  GI ATTENDING  Interval history data reviewed. Patient personally seen and examined. Wife in room. Appreciate surgical consultation note. Overall feeling better and laboratory parameters improving except for white blood cell count. Started on Unasyn, but not sure that this is necessary, but okay for now. I suspect his leukocytosis is secondary to pancreatic inflammation if  other sources ruled out. Plans for laparoscopic cholecystectomy with IOC when deemed clinically appropriate by general surgery.  Docia Chuck. Geri Seminole., M.D. [facility]enter[facility]

## 2015-03-13 ENCOUNTER — Inpatient Hospital Stay (HOSPITAL_COMMUNITY): Payer: BLUE CROSS/BLUE SHIELD

## 2015-03-13 DIAGNOSIS — K850 Idiopathic acute pancreatitis: Secondary | ICD-10-CM

## 2015-03-13 DIAGNOSIS — K567 Ileus, unspecified: Secondary | ICD-10-CM

## 2015-03-13 LAB — CBC
HCT: 43.8 % (ref 39.0–52.0)
Hemoglobin: 15.1 g/dL (ref 13.0–17.0)
MCH: 34.2 pg — AB (ref 26.0–34.0)
MCHC: 34.5 g/dL (ref 30.0–36.0)
MCV: 99.1 fL (ref 78.0–100.0)
Platelets: 209 10*3/uL (ref 150–400)
RBC: 4.42 MIL/uL (ref 4.22–5.81)
RDW: 12.7 % (ref 11.5–15.5)
WBC: 18.6 10*3/uL — ABNORMAL HIGH (ref 4.0–10.5)

## 2015-03-13 LAB — COMPREHENSIVE METABOLIC PANEL
ALT: 135 U/L — ABNORMAL HIGH (ref 17–63)
AST: 39 U/L (ref 15–41)
Albumin: 3.1 g/dL — ABNORMAL LOW (ref 3.5–5.0)
Alkaline Phosphatase: 67 U/L (ref 38–126)
Anion gap: 8 (ref 5–15)
BUN: 20 mg/dL (ref 6–20)
CHLORIDE: 100 mmol/L — AB (ref 101–111)
CO2: 30 mmol/L (ref 22–32)
CREATININE: 0.57 mg/dL — AB (ref 0.61–1.24)
Calcium: 8.1 mg/dL — ABNORMAL LOW (ref 8.9–10.3)
GFR calc Af Amer: >60 mL/min (ref >60)
GFR calc non Af Amer: >60 mL/min (ref >60)
Glucose, Bld: 103 mg/dL — ABNORMAL HIGH (ref 65–99)
POTASSIUM: 2.6 mmol/L — AB (ref 3.5–5.1)
Sodium: 138 mmol/L (ref 135–145)
TOTAL PROTEIN: 6.4 g/dL — AB (ref 6.5–8.1)
Total Bilirubin: 1.8 mg/dL — ABNORMAL HIGH (ref 0.3–1.2)

## 2015-03-13 MED ORDER — LISINOPRIL 10 MG PO TABS
10.0000 mg | ORAL_TABLET | Freq: Every day | ORAL | Status: DC
  Administered 2015-03-13 – 2015-03-15 (×2): 10 mg via ORAL
  Filled 2015-03-13 (×4): qty 1

## 2015-03-13 MED ORDER — SODIUM CHLORIDE 0.9 % IV SOLN
INTRAVENOUS | Status: DC
  Administered 2015-03-13 – 2015-03-14 (×3): via INTRAVENOUS

## 2015-03-13 MED ORDER — HYDRALAZINE HCL 20 MG/ML IJ SOLN
10.0000 mg | Freq: Four times a day (QID) | INTRAMUSCULAR | Status: DC | PRN
  Administered 2015-03-13 (×3): 10 mg via INTRAVENOUS
  Filled 2015-03-13 (×3): qty 1

## 2015-03-13 MED ORDER — POTASSIUM CHLORIDE 10 MEQ/100ML IV SOLN
10.0000 meq | INTRAVENOUS | Status: AC
  Administered 2015-03-13 (×4): 10 meq via INTRAVENOUS
  Filled 2015-03-13 (×4): qty 100

## 2015-03-13 NOTE — Progress Notes (Signed)
Patient ID: Travis Ellis, male   DOB: May 22, 1956, 59 y.o.   MRN: [id number]     [facility]      [address] [place], [place] [address]    Phone: [phone] FAX: [phone]     Subjective: Moderate pain 8/10.  No nausea or vomiting.  Passing flatus.  Hungry.  K low.  LFTs are trending down.  Lipase yesterday down to 96.  WBC down.    Objective:  Vital signs:  Filed Vitals:   03/12/15 2128 03/13/15 0047 03/13/15 0544 03/13/15 1012  BP: 179/84 176/89 176/79 149/81  Pulse: 106 109 107   Temp: 98 F (36.7 C)  97.5 F (36.4 C)   TempSrc: Oral  Oral   Resp: 18  18   Height:      Weight:      SpO2: 91%  90%        Intake/Output   Yesterday:  07/10 0701 - 07/11 0700 In: 320 [P.O.:320] Out: 950 [Urine:950] This shift:    I/O last 3 completed shifts: In: 320 [P.O.:320] Out: 1950 [Urine:1950]    Physical Exam: General: Pt awake/alert/oriented x4 in no acute distress Abdomen: +BS, abdomen is soft, moderate TTP to LUQ, non to RUQ.    Problem List:   Active Problems:   HTN (hypertension)   Pancreatitis   LFT elevation   Leukocytosis   Acute pancreatitis    Results:   Labs: Results for orders placed or performed during the hospital encounter of 03/10/15 (from the past 48 hour(s))  Comprehensive metabolic panel     Status: Abnormal   Collection Time: 03/12/15  5:16 AM  Result Value Ref Range   Sodium 141 135 - 145 mmol/L   Potassium 3.0 (L) 3.5 - 5.1 mmol/L   Chloride 103 101 - 111 mmol/L   CO2 31 22 - 32 mmol/L   Glucose, Bld 96 65 - 99 mg/dL   BUN 20 6 - 20 mg/dL   Creatinine, Ser 0.63 0.61 - 1.24 mg/dL   Calcium 7.8 (L) 8.9 - 10.3 mg/dL   Total Protein 6.1 (L) 6.5 - 8.1 g/dL   Albumin 3.2 (L) 3.5 - 5.0 g/dL   AST 71 (H) 15 - 41 U/L   ALT 216 (H) 17 - 63 U/L   Alkaline Phosphatase 74 38 - 126 U/L   Total Bilirubin 1.7 (H) 0.3 - 1.2 mg/dL   GFR calc non Af Amer >60 >60 mL/min   GFR calc Af Amer >60  >60 mL/min    Comment: (NOTE) The eGFR has been calculated using the CKD EPI equation. This calculation has not been validated in all clinical situations. eGFR's persistently <60 mL/min signify possible Chronic Kidney Disease.    Anion gap 7 5 - 15  CBC     Status: Abnormal   Collection Time: 03/12/15  5:16 AM  Result Value Ref Range   WBC 22.8 (H) 4.0 - 10.5 K/uL   RBC 4.60 4.22 - 5.81 MIL/uL   Hemoglobin 15.5 13.0 - 17.0 g/dL   HCT 46.4 39.0 - 52.0 %   MCV 100.9 (H) 78.0 - 100.0 fL   MCH 33.7 26.0 - 34.0 pg   MCHC 33.4 30.0 - 36.0 g/dL   RDW 12.9 11.5 - 15.5 %   Platelets 204 150 - 400 K/uL  Lipase, blood     Status: Abnormal   Collection Time: 03/12/15  5:16 AM  Result Value Ref Range   Lipase  96 (H) 22 - 51 U/L  Comprehensive metabolic panel     Status: Abnormal   Collection Time: 03/13/15  5:22 AM  Result Value Ref Range   Sodium 138 135 - 145 mmol/L   Potassium 2.6 (LL) 3.5 - 5.1 mmol/L    Comment: REPEATED TO VERIFY CRITICAL RESULT CALLED TO, READ BACK BY AND VERIFIED WITH: Jonette Eva RN AT [phone] ON 07.11.16 BY G KONTOS    Chloride 100 (L) 101 - 111 mmol/L   CO2 30 22 - 32 mmol/L   Glucose, Bld 103 (H) 65 - 99 mg/dL   BUN 20 6 - 20 mg/dL   Creatinine, Ser 0.57 (L) 0.61 - 1.24 mg/dL   Calcium 8.1 (L) 8.9 - 10.3 mg/dL   Total Protein 6.4 (L) 6.5 - 8.1 g/dL   Albumin 3.1 (L) 3.5 - 5.0 g/dL   AST 39 15 - 41 U/L   ALT 135 (H) 17 - 63 U/L   Alkaline Phosphatase 67 38 - 126 U/L   Total Bilirubin 1.8 (H) 0.3 - 1.2 mg/dL   GFR calc non Af Amer >60 >60 mL/min   GFR calc Af Amer >60 >60 mL/min    Comment: (NOTE) The eGFR has been calculated using the CKD EPI equation. This calculation has not been validated in all clinical situations. eGFR's persistently <60 mL/min signify possible Chronic Kidney Disease.    Anion gap 8 5 - 15  CBC     Status: Abnormal   Collection Time: 03/13/15  5:22 AM  Result Value Ref Range   WBC 18.6 (H) 4.0 - 10.5 K/uL   RBC 4.42 4.22 - 5.81  MIL/uL   Hemoglobin 15.1 13.0 - 17.0 g/dL   HCT 43.8 39.0 - 52.0 %   MCV 99.1 78.0 - 100.0 fL   MCH 34.2 (H) 26.0 - 34.0 pg   MCHC 34.5 30.0 - 36.0 g/dL   RDW 12.7 11.5 - 15.5 %   Platelets 209 150 - 400 K/uL    Imaging / Studies: No results found.  Medications / Allergies:  Scheduled Meds: . ampicillin-sulbactam (UNASYN) IV  3 g Intravenous 4 times per day  . antiseptic oral rinse  7 mL Mouth Rinse q12n4p  . chlorhexidine  15 mL Mouth Rinse BID  . lisinopril  10 mg Oral Daily  . loratadine  10 mg Oral Daily  . niacin  100 mg Oral QHS  . pantoprazole (PROTONIX) IV  40 mg Intravenous QHS   Continuous Infusions:  PRN Meds:.hydrALAZINE, HYDROmorphone (DILAUDID) injection, ondansetron **OR** ondansetron (ZOFRAN) IV  Antibiotics: Anti-infectives    Start     Dose/Rate Route Frequency Ordered Stop   03/12/15 1100  Ampicillin-Sulbactam (UNASYN) 3 g in sodium chloride 0.9 % 100 mL IVPB     3 g 100 mL/hr over 60 Minutes Intravenous 4 times per day 03/12/15 1024          Assessment/Plan Acute pancreatitis-presumed biliary.  Despite Lipase normalizing, the patient remains moderately tender on exam.  Will repeat abdomen US to evaluate for stones.  If positive for stones, will consider consider cholecystectomy this admission.  Otherwise, will plan for a interval cholecystectomy once he recovers from this.  The patient did recently start taking lipoic acid for "neuropathy" but not sure if this can cause pancreatitis or increased LFTs.  Would leave him NPO, pain control and IVF.  Will follow.    Erby Pian, [facility] Pager [phone](7A-4:30P)   03/13/2015 10:34 AM

## 2015-03-13 NOTE — Progress Notes (Signed)
CRITICAL VALUE ALERT  Critical value received:  K+ 2.6  Date of notification:  [id number]  Time of notification:  0555  Critical value read back:yes  Nurse who received alert:  VMJ  MD notified (1st page):  Fredirick Maudlin  Time of first page:  0558  MD notified (2nd page):na  Time of second page:na  Responding MD:  Fredirick Maudlin Time MD responded:  [phone]

## 2015-03-13 NOTE — Progress Notes (Signed)
Utilization review completed.  

## 2015-03-13 NOTE — Progress Notes (Signed)
    Progress Note   Subjective  feels miserable- bloated and having low "gas pains", passing flatus   Objective   Vital signs in last 24 hours: Temp:  [97.3 F (36.3 C)-98 F (36.7 C)] 97.5 F (36.4 C) (07/11 0544) Pulse Rate:  [101-109] 107 (07/11 0544) Resp:  [18] 18 (07/11 0544) BP: (157-179)/(79-89) 176/79 mmHg (07/11 0544) SpO2:  [90 %-91 %] 90 % (07/11 0544)   General:    Pleasant white male in NAD Heart:  Slightly tachy. Regular rhythms Abdomen:  Soft, mildly distended, tympanitic, a few bowel sounds. Extremities:  Without edema. Neurologic:  Alert and oriented,  grossly normal neurologically. Psych:  Cooperative. Normal mood and affect.    Lab Results:  Recent Labs  03/11/15 0513 03/12/15 0516 03/13/15 0522  WBC 18.5* 22.8* 18.6*  HGB 17.1* 15.5 15.1  HCT 49.6 46.4 43.8  PLT 186 204 209   BMET  Recent Labs  03/11/15 0513 03/12/15 0516 03/13/15 0522  NA 137 141 138  K 3.1* 3.0* 2.6*  CL 102 103 100*  CO2 26 31 30   GLUCOSE 115* 96 103*  BUN 23* 20 20  CREATININE 0.53* 0.63 0.57*  CALCIUM 8.3* 7.8* 8.1*   LFT  Recent Labs  03/13/15 0522  PROT 6.4*  ALBUMIN 3.1*  AST 39  ALT 135*  ALKPHOS 67  BILITOT 1.8*     Assessment / Plan:    22. 59 year old male with acute pancreatitis, presumably gallstone. Ultrasound didn't show stones but exam was limited. Bili remaining around 1.8, other numbers improving. For probable cholecystectomy with IOC. Continue supportive care for now.   2. Bloating. Today this is causing main discomfort. He is tympanitic but has a few bowel sounds. May have ileus.    3. Hypokalemia, repletion in progress. K only 2.6 today    LOS: 3 days   Tye Savoy  03/13/2015, 9:19 AM

## 2015-03-13 NOTE — Progress Notes (Signed)
[facility] PROGRESS NOTE  Travis Ellis DPO:[id number] DOB: 06-18-1956 DOA: 03/10/2015 PCP: Travis Portela, MD  Assessment/Plan: Acute pancreatitis - Suspect secondary to transient biliary obstruction, possibly from stone that may have already passed - Followed by GI, recs for lap chole prior to d/c - General Surgery following. Plans to cont NPO w/ IVF for now - GB sludge noted on [place]  Leukocytosis - Initially thought to be reactive - Also consider possible early acute cholecystitis? - Have started empiric unasyn - WBC improving  LFT elevation - continuing to improve  HTN - BP trending up - Have resumed lisinopril - cont to titrate bp meds as needed  HLD - on statin  Abd Pain - Increased generalized pain with gas noted on abd Xray per my own read - Abd [place] with findings of GB sludge, no evidence of acute cholecystitis on [place]  Code Status: Full Family Communication: Pt in room, family at bedside Disposition Plan: Pending   Consultants:  GI  General Surgery  Procedures:    Antibiotics:  Unasyn 7/10>>>  HPI/Subjective: Complains of generalized abd pain and fullness  Objective: Filed Vitals:   03/12/15 2128 03/13/15 0047 03/13/15 0544 03/13/15 1012  BP: 179/84 176/89 176/79 149/81  Pulse: 106 109 107   Temp: 98 F (36.7 C)  97.5 F (36.4 C)   TempSrc: Oral  Oral   Resp: 18  18   Height:      Weight:      SpO2: 91%  90%     Intake/Output Summary (Last 24 hours) at 03/13/15 1604 Last data filed at 03/13/15 1209  Gross per 24 hour  Intake    120 ml  Output    850 ml  Net   -730 ml   Filed Weights   03/10/15 2300  Weight: 97.3 kg (214 lb 8.1 oz)    Exam:   General:  Awake, in nad, laying in bed  Cardiovascular: regular, s1, s2  Respiratory: normal resp effort, no wheezing  Abdomen: soft, mildly distended, generally tender  Musculoskeletal: perfused, no clubbing   Data Reviewed: Basic Metabolic Panel:  Recent Labs Lab  03/10/15 1636 03/11/15 0513 03/12/15 0516 03/13/15 0522  NA 137 137 141 138  K 3.6 3.1* 3.0* 2.6*  CL 102 102 103 100*  CO2 25 26 31 30   GLUCOSE 169* 115* 96 103*  BUN 26* 23* 20 20  CREATININE 0.53* 0.53* 0.63 0.57*  CALCIUM 9.3 8.3* 7.8* 8.1*  MG  --  2.0  --   --   PHOS  --  3.3  --   --    Liver Function Tests:  Recent Labs Lab 03/10/15 1636 03/11/15 0513 03/12/15 0516 03/13/15 0522  AST 328* 219* 71* 39  ALT 428* 376* 216* 135*  ALKPHOS 81 79 74 67  BILITOT 4.4* 2.7* 1.7* 1.8*  PROT 7.5 6.7 6.1* 6.4*  ALBUMIN 4.2 3.8 3.2* 3.1*    Recent Labs Lab 03/10/15 1636 03/11/15 0513 03/12/15 0516  LIPASE 786* 324* 96*   No results for input(s): AMMONIA in the last 168 hours. CBC:  Recent Labs Lab 03/10/15 1636 03/11/15 0513 03/12/15 0516 03/13/15 0522  WBC 13.4* 18.5* 22.8* 18.6*  NEUTROABS 12.6*  --   --   --   HGB 17.6* 17.1* 15.5 15.1  HCT 49.1 49.6 46.4 43.8  MCV 95.3 98.6 100.9* 99.1  PLT 228 186 204 209   Cardiac Enzymes: No results for input(s): CKTOTAL, CKMB, CKMBINDEX, TROPONINI in the last  168 hours. BNP (last 3 results) No results for input(s): BNP in the last 8760 hours.  ProBNP (last 3 results) No results for input(s): PROBNP in the last 8760 hours.  CBG: No results for input(s): GLUCAP in the last 168 hours.  No results found for this or any previous visit (from the past 240 hour(s)).   Studies: Dg Abd 1 View  03/13/2015   CLINICAL DATA:  Severe abdominal pain, ileus  EXAM: ABDOMEN - 1 VIEW  COMPARISON:  None.  FINDINGS: Nonobstructive bowel gas pattern.  Mild gaseous distention of the ascending/transverse colon.  Visualized osseous structures are within normal limits.  IMPRESSION: Normal abdominal radiographs.   Electronically Signed   By: Julian Hy M.D.   On: 03/13/2015 11:17   US Abdomen Limited Ruq  03/13/2015   CLINICAL DATA:  Acute pancreatitis, right upper quadrant pain, history of prostate cancer status post  prostatectomy  EXAM: US ABDOMEN LIMITED - RIGHT UPPER QUADRANT  COMPARISON:  CT abdomen pelvis dated 03/10/2015  FINDINGS: Gallbladder:  Gallbladder sludge. No gallbladder wall thickening or pericholecystic fluid. Negative sonographic Murphy's sign.  Common bile duct:  Diameter: 3 mm  Liver:  Hyperechoic hepatic parenchyma with coarse echogenicity. No focal hepatic lesion is seen.  Additional comments: Small volume perihepatic ascites. Right pleural effusion.  IMPRESSION: Gallbladder sludge. No associated sonographic findings to suggest acute cholecystitis.  No intrahepatic or extrahepatic ductal dilatation.  Suspected hepatic steatosis.   Electronically Signed   By: Julian Hy M.D.   On: 03/13/2015 11:13    Scheduled Meds: . ampicillin-sulbactam (UNASYN) IV  3 g Intravenous 4 times per day  . antiseptic oral rinse  7 mL Mouth Rinse q12n4p  . chlorhexidine  15 mL Mouth Rinse BID  . lisinopril  10 mg Oral Daily  . loratadine  10 mg Oral Daily  . niacin  100 mg Oral QHS  . pantoprazole (PROTONIX) IV  40 mg Intravenous QHS   Continuous Infusions:   Active Problems:   HTN (hypertension)   Pancreatitis   LFT elevation   Leukocytosis   Acute pancreatitis   Travis Ellis K  [facility] Pager [phone]. If 7PM-7AM, please contact night-coverage at [email or web address], password [facility] 03/13/2015, 4:04 PM  LOS: 3 days

## 2015-03-13 NOTE — Progress Notes (Signed)
This shift new orders received for critical lab value K+ 2.6 from attending T. Rogue Bussing

## 2015-03-14 DIAGNOSIS — K859 Acute pancreatitis, unspecified: Secondary | ICD-10-CM

## 2015-03-14 LAB — BASIC METABOLIC PANEL
ANION GAP: 10 (ref 5–15)
Anion gap: 10 (ref 5–15)
BUN: 21 mg/dL — AB (ref 6–20)
BUN: 21 mg/dL — ABNORMAL HIGH (ref 6–20)
CALCIUM: 7.9 mg/dL — AB (ref 8.9–10.3)
CHLORIDE: 105 mmol/L (ref 101–111)
CO2: 30 mmol/L (ref 22–32)
CO2: 29 mmol/L (ref 22–32)
CREATININE: 0.49 mg/dL — AB (ref 0.61–1.24)
Calcium: 8.3 mg/dL — ABNORMAL LOW (ref 8.9–10.3)
Chloride: 101 mmol/L (ref 101–111)
Creatinine, Ser: 0.5 mg/dL — ABNORMAL LOW (ref 0.61–1.24)
GFR calc Af Amer: >60 mL/min (ref >60)
GFR calc non Af Amer: >60 mL/min (ref >60)
GFR calc non Af Amer: >60 mL/min (ref >60)
GLUCOSE: 85 mg/dL (ref 65–99)
Glucose, Bld: 81 mg/dL (ref 65–99)
Potassium: 2.9 mmol/L — ABNORMAL LOW (ref 3.5–5.1)
Potassium: 2.8 mmol/L — ABNORMAL LOW (ref 3.5–5.1)
SODIUM: 141 mmol/L (ref 135–145)
Sodium: 144 mmol/L (ref 135–145)

## 2015-03-14 LAB — HEPATIC FUNCTION PANEL
ALBUMIN: 2.9 g/dL — AB (ref 3.5–5.0)
ALT: 88 U/L — AB (ref 17–63)
AST: 38 U/L (ref 15–41)
Alkaline Phosphatase: 68 U/L (ref 38–126)
BILIRUBIN INDIRECT: 1.2 mg/dL — AB (ref 0.3–0.9)
Bilirubin, Direct: 0.4 mg/dL (ref 0.1–0.5)
TOTAL PROTEIN: 6.3 g/dL — AB (ref 6.5–8.1)
Total Bilirubin: 1.6 mg/dL — ABNORMAL HIGH (ref 0.3–1.2)

## 2015-03-14 LAB — CBC
HCT: 43.5 % (ref 39.0–52.0)
Hemoglobin: 14.9 g/dL (ref 13.0–17.0)
MCH: 33.3 pg (ref 26.0–34.0)
MCHC: 34.3 g/dL (ref 30.0–36.0)
MCV: 97.1 fL (ref 78.0–100.0)
PLATELETS: 239 10*3/uL (ref 150–400)
RBC: 4.48 MIL/uL (ref 4.22–5.81)
RDW: 13 % (ref 11.5–15.5)
WBC: 17.2 10*3/uL — ABNORMAL HIGH (ref 4.0–10.5)

## 2015-03-14 LAB — LIPASE, BLOOD: LIPASE: 16 U/L — AB (ref 22–51)

## 2015-03-14 LAB — MAGNESIUM: Magnesium: 2.4 mg/dL (ref 1.7–2.4)

## 2015-03-14 MED ORDER — POTASSIUM CHLORIDE 10 MEQ/100ML IV SOLN
10.0000 meq | INTRAVENOUS | Status: AC
  Administered 2015-03-14 – 2015-03-15 (×6): 10 meq via INTRAVENOUS
  Filled 2015-03-14 (×6): qty 100

## 2015-03-14 MED ORDER — POTASSIUM CHLORIDE 10 MEQ/100ML IV SOLN
10.0000 meq | INTRAVENOUS | Status: AC
  Administered 2015-03-14 (×5): 10 meq via INTRAVENOUS
  Filled 2015-03-14 (×7): qty 100

## 2015-03-14 NOTE — Progress Notes (Signed)
Patient ID: Travis Ellis, male   DOB: 07/13/1956, 59 y.o.   MRN: [id number]     [facility]      [address] [place], [place] [address]    Phone: [phone] FAX: [phone]     Subjective: Pain is better. Afebrile.  WBC trending down.    Objective:  Vital signs:  Filed Vitals:   03/13/15 1630 03/13/15 1855 03/13/15 2224 03/14/15 0606  BP: 164/83 149/75 164/85 158/87  Pulse: 103 105 108 107  Temp: 98.9 F (37.2 C)  98.7 F (37.1 C) 97.7 F (36.5 C)  TempSrc: Axillary  Oral Oral  Resp: 20 18 18 18   Height:      Weight:      SpO2: 91% 94% 94% 94%       Intake/Output   Yesterday:  07/11 0701 - 07/12 0700 In: 1215 [I.V.:1015; IV Piggyback:200] Out: 1575 [Urine:1575] This shift: I/O last 3 completed shifts: In: 1215 [I.V.:1015; IV Piggyback:200] Out: 1875 [Urine:1875]    Physical Exam: General: Pt awake/alert/oriented x4 in no acute distress  Abdomen: Soft.  Nondistended. Obese. Minimal TTP LUQ.Marland Kitchen  No evidence of peritonitis.  No incarcerated hernias.    Problem List:   Active Problems:   HTN (hypertension)   Pancreatitis   LFT elevation   Leukocytosis   Acute pancreatitis   Ileus    Results:   Labs: Results for orders placed or performed during the hospital encounter of 03/10/15 (from the past 48 hour(s))  Comprehensive metabolic panel     Status: Abnormal   Collection Time: 03/13/15  5:22 AM  Result Value Ref Range   Sodium 138 135 - 145 mmol/L   Potassium 2.6 (LL) 3.5 - 5.1 mmol/L    Comment: REPEATED TO VERIFY CRITICAL RESULT CALLED TO, READ BACK BY AND VERIFIED WITH: Jonette Eva RN AT [phone] ON 07.11.16 BY G KONTOS    Chloride 100 (L) 101 - 111 mmol/L   CO2 30 22 - 32 mmol/L   Glucose, Bld 103 (H) 65 - 99 mg/dL   BUN 20 6 - 20 mg/dL   Creatinine, Ser 0.57 (L) 0.61 - 1.24 mg/dL   Calcium 8.1 (L) 8.9 - 10.3 mg/dL   Total Protein 6.4 (L) 6.5 - 8.1 g/dL   Albumin 3.1 (L) 3.5 - 5.0 g/dL   AST  39 15 - 41 U/L   ALT 135 (H) 17 - 63 U/L   Alkaline Phosphatase 67 38 - 126 U/L   Total Bilirubin 1.8 (H) 0.3 - 1.2 mg/dL   GFR calc non Af Amer >60 >60 mL/min   GFR calc Af Amer >60 >60 mL/min    Comment: (NOTE) The eGFR has been calculated using the CKD EPI equation. This calculation has not been validated in all clinical situations. eGFR's persistently <60 mL/min signify possible Chronic Kidney Disease.    Anion gap 8 5 - 15  CBC     Status: Abnormal   Collection Time: 03/13/15  5:22 AM  Result Value Ref Range   WBC 18.6 (H) 4.0 - 10.5 K/uL   RBC 4.42 4.22 - 5.81 MIL/uL   Hemoglobin 15.1 13.0 - 17.0 g/dL   HCT 43.8 39.0 - 52.0 %   MCV 99.1 78.0 - 100.0 fL   MCH 34.2 (H) 26.0 - 34.0 pg   MCHC 34.5 30.0 - 36.0 g/dL   RDW 12.7 11.5 - 15.5 %   Platelets 209 150 - 400 K/uL  Basic metabolic panel  Status: Abnormal   Collection Time: 03/14/15  5:07 AM  Result Value Ref Range   Sodium 141 135 - 145 mmol/L   Potassium 2.9 (L) 3.5 - 5.1 mmol/L   Chloride 101 101 - 111 mmol/L   CO2 30 22 - 32 mmol/L   Glucose, Bld 81 65 - 99 mg/dL   BUN 21 (H) 6 - 20 mg/dL   Creatinine, Ser 0.50 (L) 0.61 - 1.24 mg/dL   Calcium 7.9 (L) 8.9 - 10.3 mg/dL   GFR calc non Af Amer >60 >60 mL/min   GFR calc Af Amer >60 >60 mL/min    Comment: (NOTE) The eGFR has been calculated using the CKD EPI equation. This calculation has not been validated in all clinical situations. eGFR's persistently <60 mL/min signify possible Chronic Kidney Disease.    Anion gap 10 5 - 15  CBC     Status: Abnormal   Collection Time: 03/14/15  5:07 AM  Result Value Ref Range   WBC 17.2 (H) 4.0 - 10.5 K/uL   RBC 4.48 4.22 - 5.81 MIL/uL   Hemoglobin 14.9 13.0 - 17.0 g/dL   HCT 43.5 39.0 - 52.0 %   MCV 97.1 78.0 - 100.0 fL   MCH 33.3 26.0 - 34.0 pg   MCHC 34.3 30.0 - 36.0 g/dL   RDW 13.0 11.5 - 15.5 %   Platelets 239 150 - 400 K/uL  Hepatic function panel     Status: Abnormal   Collection Time: 03/14/15  5:07 AM   Result Value Ref Range   Total Protein 6.3 (L) 6.5 - 8.1 g/dL   Albumin 2.9 (L) 3.5 - 5.0 g/dL   AST 38 15 - 41 U/L   ALT 88 (H) 17 - 63 U/L   Alkaline Phosphatase 68 38 - 126 U/L   Total Bilirubin 1.6 (H) 0.3 - 1.2 mg/dL   Bilirubin, Direct 0.4 0.1 - 0.5 mg/dL   Indirect Bilirubin 1.2 (H) 0.3 - 0.9 mg/dL  Lipase, blood     Status: Abnormal   Collection Time: 03/14/15  5:07 AM  Result Value Ref Range   Lipase 16 (L) 22 - 51 U/L  Magnesium     Status: None   Collection Time: 03/14/15  5:07 AM  Result Value Ref Range   Magnesium 2.4 1.7 - 2.4 mg/dL    Imaging / Studies: Dg Abd 1 View  03/13/2015   CLINICAL DATA:  Severe abdominal pain, ileus  EXAM: ABDOMEN - 1 VIEW  COMPARISON:  None.  FINDINGS: Nonobstructive bowel gas pattern.  Mild gaseous distention of the ascending/transverse colon.  Visualized osseous structures are within normal limits.  IMPRESSION: Normal abdominal radiographs.   Electronically Signed   By: Julian Hy M.D.   On: 03/13/2015 11:17   US Abdomen Limited Ruq  03/13/2015   CLINICAL DATA:  Acute pancreatitis, right upper quadrant pain, history of prostate cancer status post prostatectomy  EXAM: US ABDOMEN LIMITED - RIGHT UPPER QUADRANT  COMPARISON:  CT abdomen pelvis dated 03/10/2015  FINDINGS: Gallbladder:  Gallbladder sludge. No gallbladder wall thickening or pericholecystic fluid. Negative sonographic Murphy's sign.  Common bile duct:  Diameter: 3 mm  Liver:  Hyperechoic hepatic parenchyma with coarse echogenicity. No focal hepatic lesion is seen.  Additional comments: Small volume perihepatic ascites. Right pleural effusion.  IMPRESSION: Gallbladder sludge. No associated sonographic findings to suggest acute cholecystitis.  No intrahepatic or extrahepatic ductal dilatation.  Suspected hepatic steatosis.   Electronically Signed   By: Henderson Newcomer.D.  On: 03/13/2015 11:13    Medications / Allergies:  Scheduled Meds: . ampicillin-sulbactam (UNASYN) IV  3  g Intravenous 4 times per day  . antiseptic oral rinse  7 mL Mouth Rinse q12n4p  . chlorhexidine  15 mL Mouth Rinse BID  . lisinopril  10 mg Oral Daily  . loratadine  10 mg Oral Daily  . niacin  100 mg Oral QHS  . pantoprazole (PROTONIX) IV  40 mg Intravenous QHS  . potassium chloride  10 mEq Intravenous Q1 Hr x 5   Continuous Infusions: . sodium chloride 100 mL/hr at 03/14/15 0659   PRN Meds:.hydrALAZINE, HYDROmorphone (DILAUDID) injection, ondansetron **OR** ondansetron (ZOFRAN) IV  Antibiotics: Anti-infectives    Start     Dose/Rate Route Frequency Ordered Stop   03/12/15 1100  Ampicillin-Sulbactam (UNASYN) 3 g in sodium chloride 0.9 % 100 mL IVPB     3 g 100 mL/hr over 60 Minutes Intravenous 4 times per day 03/12/15 1024          Assessment/Plan Acute pancreatitis Abnormal LFTs Gallbladder sludge  LFTs improving.  Lipase normal.  Much less tender on exam.  WBC remains elevated. US showed sludge.  Dr. Hassell Done to decide whether to proceed with lap chole this admission versus interval cholecystectomy in which case he can start clears and advance per GI recs.  Hypokalemia-2.9 today.  KCL x5 runs.  Could consider K in maintenance fluids.    Erby Pian, [facility] Surgery Pager [phone]([id number])   03/14/2015 9:42 AM

## 2015-03-14 NOTE — Progress Notes (Addendum)
[facility] PROGRESS NOTE  Travis Ellis FMB:[id number] DOB: 24-Jul-1956 DOA: 03/10/2015 PCP: Kennon Portela, MD   Off Service Summary: Presented with gallstone pancreatitis. GI and general surgery following. Pending cholecystectomy once K replaced.  Assessment/Plan: Acute pancreatitis - Likely secondary to transient biliary obstruction, possibly from stone that may have already passed - Currently followed by GI, recs for lap chole prior to d/c - General Surgery following. Plans to cont NPO w/ IVF for now - GB sludge noted on [place] - Pt reports feeling better today  Leukocytosis - Initially thought to be reactive - Also consider possible early acute cholecystitis? - Have started empiric unasyn - WBC continuing to improve - Consider completing course with augmentin when ultimately able to take PO  LFT elevation - continuing to improve  HTN - BP trending up - Have resumed lisinopril - cont to titrate bp meds as needed - Suspect bp up secondary to pain  HLD - on statin  Abd Pain - Increased generalized pain with gas noted on abd Xray per my own read - Abd [place] with findings of GB sludge, no evidence of acute cholecystitis on [place] - Pt notes feeling better after passing flatus  Hypokalemia - 14meq KCL given this AM for K of 2.9 - Repeat BMET  Code Status: Full Family Communication: Pt in room, family at bedside Disposition Plan: Pending   Consultants:  GI  General Surgery  Procedures:    Antibiotics:  Unasyn 7/10>>>  HPI/Subjective: Feels better today. Eager to have surgery  Objective: Filed Vitals:   03/13/15 1855 03/13/15 2224 03/14/15 0606 03/14/15 1327  BP: 149/75 164/85 158/87 160/83  Pulse: 105 108 107 109  Temp:  98.7 F (37.1 C) 97.7 F (36.5 C) 98 F (36.7 C)  TempSrc:  Oral Oral Axillary  Resp: 18 18 18 20   Height:      Weight:      SpO2: 94% 94% 94% 96%    Intake/Output Summary (Last 24 hours) at 03/14/15 1642 Last data filed  at 03/14/15 1339  Gross per 24 hour  Intake   1215 ml  Output   1600 ml  Net   -385 ml   Filed Weights   03/10/15 2300  Weight: 97.3 kg (214 lb 8.1 oz)    Exam:   General:  Awake, in nad, sitting in chair  Cardiovascular: regular, s1, s2  Respiratory: normal resp effort, no wheezing  Abdomen: soft, less distended  Musculoskeletal: perfused, no clubbing, no cyanosis  Data Reviewed: Basic Metabolic Panel:  Recent Labs Lab 03/10/15 1636 03/11/15 0513 03/12/15 0516 03/13/15 0522 03/14/15 0507  NA 137 137 141 138 141  K 3.6 3.1* 3.0* 2.6* 2.9*  CL 102 102 103 100* 101  CO2 25 26 31 30 30   GLUCOSE 169* 115* 96 103* 81  BUN 26* 23* 20 20 21*  CREATININE 0.53* 0.53* 0.63 0.57* 0.50*  CALCIUM 9.3 8.3* 7.8* 8.1* 7.9*  MG  --  2.0  --   --  2.4  PHOS  --  3.3  --   --   --    Liver Function Tests:  Recent Labs Lab 03/10/15 1636 03/11/15 0513 03/12/15 0516 03/13/15 0522 03/14/15 0507  AST 328* 219* 71* 39 38  ALT 428* 376* 216* 135* 88*  ALKPHOS 81 79 74 67 68  BILITOT 4.4* 2.7* 1.7* 1.8* 1.6*  PROT 7.5 6.7 6.1* 6.4* 6.3*  ALBUMIN 4.2 3.8 3.2* 3.1* 2.9*    Recent Labs Lab 03/10/15  1636 03/11/15 0513 03/12/15 0516 03/14/15 0507  LIPASE 786* 324* 96* 16*   No results for input(s): AMMONIA in the last 168 hours. CBC:  Recent Labs Lab 03/10/15 1636 03/11/15 0513 03/12/15 0516 03/13/15 0522 03/14/15 0507  WBC 13.4* 18.5* 22.8* 18.6* 17.2*  NEUTROABS 12.6*  --   --   --   --   HGB 17.6* 17.1* 15.5 15.1 14.9  HCT 49.1 49.6 46.4 43.8 43.5  MCV 95.3 98.6 100.9* 99.1 97.1  PLT 228 186 204 209 239   Cardiac Enzymes: No results for input(s): CKTOTAL, CKMB, CKMBINDEX, TROPONINI in the last 168 hours. BNP (last 3 results) No results for input(s): BNP in the last 8760 hours.  ProBNP (last 3 results) No results for input(s): PROBNP in the last 8760 hours.  CBG: No results for input(s): GLUCAP in the last 168 hours.  No results found for this or  any previous visit (from the past 240 hour(s)).   Studies: Dg Abd 1 View  03/13/2015   CLINICAL DATA:  Severe abdominal pain, ileus  EXAM: ABDOMEN - 1 VIEW  COMPARISON:  None.  FINDINGS: Nonobstructive bowel gas pattern.  Mild gaseous distention of the ascending/transverse colon.  Visualized osseous structures are within normal limits.  IMPRESSION: Normal abdominal radiographs.   Electronically Signed   By: Julian Hy M.D.   On: 03/13/2015 11:17   US Abdomen Limited Ruq  03/13/2015   CLINICAL DATA:  Acute pancreatitis, right upper quadrant pain, history of prostate cancer status post prostatectomy  EXAM: US ABDOMEN LIMITED - RIGHT UPPER QUADRANT  COMPARISON:  CT abdomen pelvis dated 03/10/2015  FINDINGS: Gallbladder:  Gallbladder sludge. No gallbladder wall thickening or pericholecystic fluid. Negative sonographic Murphy's sign.  Common bile duct:  Diameter: 3 mm  Liver:  Hyperechoic hepatic parenchyma with coarse echogenicity. No focal hepatic lesion is seen.  Additional comments: Small volume perihepatic ascites. Right pleural effusion.  IMPRESSION: Gallbladder sludge. No associated sonographic findings to suggest acute cholecystitis.  No intrahepatic or extrahepatic ductal dilatation.  Suspected hepatic steatosis.   Electronically Signed   By: Julian Hy M.D.   On: 03/13/2015 11:13    Scheduled Meds: . ampicillin-sulbactam (UNASYN) IV  3 g Intravenous 4 times per day  . antiseptic oral rinse  7 mL Mouth Rinse q12n4p  . chlorhexidine  15 mL Mouth Rinse BID  . lisinopril  10 mg Oral Daily  . loratadine  10 mg Oral Daily  . niacin  100 mg Oral QHS  . pantoprazole (PROTONIX) IV  40 mg Intravenous QHS   Continuous Infusions: . sodium chloride 100 mL/hr at 03/14/15 3662    Active Problems:   HTN (hypertension)   Pancreatitis   LFT elevation   Leukocytosis   Acute pancreatitis   Ileus   CHIU, STEPHEN K  [facility] Pager [phone]. If 7PM-7AM, please contact  night-coverage at [email or web address], password [facility] 03/14/2015, 4:42 PM  LOS: 4 days

## 2015-03-14 NOTE — Treatment Plan (Signed)
Repeat K of 2.8 despite replacement this AM. Discussed with pharmacy, recs to give another 6 runs of KCl and repeat K in AM. Renal function normal

## 2015-03-14 NOTE — Progress Notes (Signed)
    Progress Note   Subjective  feels better this am, less pain and bloating   Objective   Vital signs in last 24 hours: Temp:  [97.7 F (36.5 C)-98.9 F (37.2 C)] 97.7 F (36.5 C) (07/12 0606) Pulse Rate:  [103-108] 107 (07/12 0606) Resp:  [18-20] 18 (07/12 0606) BP: (149-164)/(75-87) 158/87 mmHg (07/12 0606) SpO2:  [91 %-94 %] 94 % (07/12 0606)   General:    Pleasant obese white male in NAD Abdomen:  Soft, obese, mild LUQ tenderness.  Extremities:  Without edema. Neurologic:  Alert and oriented,  grossly normal neurologically. Psych:  Cooperative. Normal mood and affect.    Lab Results:  Recent Labs  03/12/15 0516 03/13/15 0522 03/14/15 0507  WBC 22.8* 18.6* 17.2*  HGB 15.5 15.1 14.9  HCT 46.4 43.8 43.5  PLT 204 209 239   BMET  Recent Labs  03/12/15 0516 03/13/15 0522 03/14/15 0507  NA 141 138 141  K 3.0* 2.6* 2.9*  CL 103 100* 101  CO2 31 30 30   GLUCOSE 96 103* 81  BUN 20 20 21*  CREATININE 0.63 0.57* 0.50*  CALCIUM 7.8* 8.1* 7.9*   LFT  Recent Labs  03/14/15 0507  PROT 6.3*  ALBUMIN 2.9*  AST 38  ALT 88*  ALKPHOS 68  BILITOT 1.6*  BILIDIR 0.4  IBILI 1.2*       Assessment / Plan:   57. 59 year old male with acute pancreatitis, presumably gallstone related. Unable to find literature correlating lipoic acid with pancreatitis. Initial ultrasound showed only sludge, no stones. Repeatultrasound yesterday with same findings. LFTs trending down. Bili 1.6 today. For probable cholecystectomy with IOC but not sure if this will be done inpatient not. Continue supportive care for now.   2. Bloating. Improved today.If not going for surgery should be able to try clears.   3. Hypokalemia. Hard time getting K+ up, only at 2.9 today.    LOS: 4 days   Tye Savoy  03/14/2015, 9:10 AM

## 2015-03-14 NOTE — Care Management Note (Signed)
Case Management Note  Patient Details  Name: Travis Ellis MRN: [id number] Date of Birth: 01/19/1956  Subjective/Objective: 59 y/o m admitted w/abd pain, pancreatitis, gallstones, hypokalemia.From home.                   Action/Plan: No anticipated d/c needs.   Expected Discharge Date:                  Expected Discharge Plan:  Home/Self Care  In-House Referral:     Discharge planning Services  CM Consult  Post Acute Care Choice:    Choice offered to:     DME Arranged:    DME Agency:     HH Arranged:    HH Agency:     Status of Service:  In process, will continue to follow  Medicare Important Message Given:    Date Medicare IM Given:    Medicare IM give by:    Date Additional Medicare IM Given:    Additional Medicare Important Message give by:     If discussed at [place] of Stay Meetings, dates discussed:    Additional Comments:  Dessa Phi, RN 03/14/2015, 4:12 PM

## 2015-03-15 LAB — HEPATIC FUNCTION PANEL
ALBUMIN: 2.5 g/dL — AB (ref 3.5–5.0)
ALK PHOS: 83 U/L (ref 38–126)
ALT: 62 U/L (ref 17–63)
AST: 27 U/L (ref 15–41)
BILIRUBIN DIRECT: 0.3 mg/dL (ref 0.1–0.5)
Indirect Bilirubin: 1 mg/dL — ABNORMAL HIGH (ref 0.3–0.9)
Total Bilirubin: 1.3 mg/dL — ABNORMAL HIGH (ref 0.3–1.2)
Total Protein: 5.8 g/dL — ABNORMAL LOW (ref 6.5–8.1)

## 2015-03-15 LAB — CBC
HCT: 44.4 % (ref 39.0–52.0)
Hemoglobin: 14.9 g/dL (ref 13.0–17.0)
MCH: 33.6 pg (ref 26.0–34.0)
MCHC: 33.6 g/dL (ref 30.0–36.0)
MCV: 100.2 fL — ABNORMAL HIGH (ref 78.0–100.0)
Platelets: 251 10*3/uL (ref 150–400)
RBC: 4.43 MIL/uL (ref 4.22–5.81)
RDW: 13.5 % (ref 11.5–15.5)
WBC: 17.7 10*3/uL — AB (ref 4.0–10.5)

## 2015-03-15 LAB — BASIC METABOLIC PANEL
ANION GAP: 11 (ref 5–15)
BUN: 20 mg/dL (ref 6–20)
CO2: 30 mmol/L (ref 22–32)
CREATININE: 0.5 mg/dL — AB (ref 0.61–1.24)
Calcium: 8.4 mg/dL — ABNORMAL LOW (ref 8.9–10.3)
Chloride: 106 mmol/L (ref 101–111)
GFR calc Af Amer: >60 mL/min (ref >60)
GFR calc non Af Amer: >60 mL/min (ref >60)
GLUCOSE: 78 mg/dL (ref 65–99)
POTASSIUM: 3.1 mmol/L — AB (ref 3.5–5.1)
SODIUM: 147 mmol/L — AB (ref 135–145)

## 2015-03-15 LAB — LIPASE, BLOOD: Lipase: 13 U/L — ABNORMAL LOW (ref 22–51)

## 2015-03-15 MED ORDER — POTASSIUM CHLORIDE CRYS ER 20 MEQ PO TBCR
40.0000 meq | EXTENDED_RELEASE_TABLET | Freq: Three times a day (TID) | ORAL | Status: AC
  Administered 2015-03-15 (×3): 40 meq via ORAL
  Filled 2015-03-15 (×3): qty 2

## 2015-03-15 MED ORDER — BISACODYL 10 MG RE SUPP
10.0000 mg | Freq: Two times a day (BID) | RECTAL | Status: DC
  Administered 2015-03-15: 10 mg via RECTAL
  Filled 2015-03-15: qty 1

## 2015-03-15 MED ORDER — SODIUM CHLORIDE 0.9 % IV SOLN
INTRAVENOUS | Status: DC
  Administered 2015-03-15: 11:00:00 via INTRAVENOUS
  Filled 2015-03-15 (×5): qty 1000

## 2015-03-15 MED ORDER — CEFTRIAXONE SODIUM IN DEXTROSE 40 MG/ML IV SOLN
2.0000 g | INTRAVENOUS | Status: DC
  Administered 2015-03-15 – 2015-03-16 (×2): 2 g via INTRAVENOUS
  Filled 2015-03-15 (×2): qty 50

## 2015-03-15 NOTE — Progress Notes (Signed)
[facility] PROGRESS NOTE  GOKU HARB TZG:[id number] DOB: Dec 14, 1955 DOA: 03/10/2015 PCP: Kennon Portela, MD   HPI/Subjective: Pt reports needing Dilaudid every 2 hours for pain which has migrated from left upper abdomen to left lower abdomen. He is very hungry and wants to have food so that he can have a bowel movement before surgery tomorrow morning. Last BM on Friday.   Assessment/Plan: 1. Acute pancreatitis - Likely secondary to transient biliary obstruction, possibly from stone that may have already passed - GB sludge noted on [place] - Met with general surgery this AM (Emina Riebock, NP); plan for laparoscopic cholecystectomy with IOC tomorrow - Per surgery: D/C NPO - give clears, may advance to fulls for lunch and dinner, then NPO after midnight - Patient needs to mobilize at least 6 times per day.  2. Leukocytosis - Initially thought to be reactive - WBC slightly elevated from yesterday at 17.7 (from 17.2) - Unasyn stopped; given Rocephin 2g, IV, Q24H  3. LFT elevation - AST, ALT and alk phosph WNL; Tot bilirubin 1.3. - Albumin dropped to 2.5.   4. HTN - BP still elevated - Taking lisinopril - Cont to titrate bp meds as needed - Suspect bp elevated d/t pain  5. HLD - on simvastatin  6. Abd Pain - Increased left lower abdominal pain; Dilaudid needed every 2 hours - Gas noted on abd X-ray; Abd [place] indicating GB sludge - Patient has order for dulcolax if no BM by this evening - Pt reports some relief after passing flatus  7. Hypokalemia - Will give Kdur 40 meq po TID today and change the IV fluids to NS + 20 meq kcl at 100 ml/hr. - Check bmp in am.  Code Status: Full Family Communication: Pt in room, wife at bedside Disposition Plan: Pending   Consultants:  GI  General Surgery  Procedures:  None  Antibiotics:  Rocephin 2g IV   Objective: Filed Vitals:   03/15/15 0601  BP: 163/80  Pulse: 108  Temp: 98.1 F (36.7 C)  Resp: 20     Intake/Output Summary (Last 24 hours) at 03/15/15 0945 Last data filed at 03/15/15 0600  Gross per 24 hour  Intake   3405 ml  Output    725 ml  Net   2680 ml   Filed Weights   03/10/15 2300  Weight: 97.3 kg (214 lb 8.1 oz)    Exam:   General:  Pt is alert, in NAD, sitting in chair talking to wife  Cardiovascular: RRR, S1 and S2 noted  Respiratory: CTA bilaterally  Abdomen: Soft, slightly distended  Musculoskeletal: Perfused, no clubbing, no cyanosis  Data Reviewed: Basic Metabolic Panel:  Recent Labs Lab 03/11/15 0513 03/12/15 0516 03/13/15 0522 03/14/15 0507 03/14/15 1707 03/15/15 0500  NA 137 141 138 141 144 147*  K 3.1* 3.0* 2.6* 2.9* 2.8* 3.1*  CL 102 103 100* 101 105 106  CO2 _0 GLUCOSE 115* 96 103* 81 85 78  BUN 23* 20 20 21* 21* 20  CREATININE 0.53* 0.63 0.57* 0.50* 0.49* 0.50*  CALCIUM 8.3* 7.8* 8.1* 7.9* 8.3* 8.4*  MG 2.0  --   --  2.4  --   --   PHOS 3.3  --   --   --   --   --    Liver Function Tests:  Recent Labs Lab 03/11/15 0513 03/12/15 0516 03/13/15 0522 03/14/15 0507 03/15/15 0500  AST 219* 71* 39 38 27  ALT  376* 216* 135* 88* 62  ALKPHOS 79 74 67 68 83  BILITOT 2.7* 1.7* 1.8* 1.6* 1.3*  PROT 6.7 6.1* 6.4* 6.3* 5.8*  ALBUMIN 3.8 3.2* 3.1* 2.9* 2.5*    Recent Labs Lab 03/10/15 1636 03/11/15 0513 03/12/15 0516 03/14/15 0507 03/15/15 0500  LIPASE 786* 324* 96* 16* 13*   No results for input(s): AMMONIA in the last 168 hours. CBC:  Recent Labs Lab 03/10/15 1636 03/11/15 0513 03/12/15 0516 03/13/15 0522 03/14/15 0507 03/15/15 0500  WBC 13.4* 18.5* 22.8* 18.6* 17.2* 17.7*  NEUTROABS 12.6*  --   --   --   --   --   HGB 17.6* 17.1* 15.5 15.1 14.9 14.9  HCT 49.1 49.6 46.4 43.8 43.5 44.4  MCV 95.3 98.6 100.9* 99.1 97.1 100.2*  PLT 228 186 204 209 239 251   Cardiac Enzymes: No results for input(s): CKTOTAL, CKMB, CKMBINDEX, TROPONINI in the last 168 hours. BNP (last 3 results) No results for  input(s): BNP in the last 8760 hours.  ProBNP (last 3 results) No results for input(s): PROBNP in the last 8760 hours.  CBG: No results for input(s): GLUCAP in the last 168 hours.  No results found for this or any previous visit (from the past 240 hour(s)).   Studies: Dg Abd 1 View  03/13/2015   CLINICAL DATA:  Severe abdominal pain, ileus  EXAM: ABDOMEN - 1 VIEW  COMPARISON:  None.  FINDINGS: Nonobstructive bowel gas pattern.  Mild gaseous distention of the ascending/transverse colon.  Visualized osseous structures are within normal limits.  IMPRESSION: Normal abdominal radiographs.   Electronically Signed   By: Julian Hy M.D.   On: 03/13/2015 11:17   US Abdomen Limited Ruq  03/13/2015   CLINICAL DATA:  Acute pancreatitis, right upper quadrant pain, history of prostate cancer status post prostatectomy  EXAM: US ABDOMEN LIMITED - RIGHT UPPER QUADRANT  COMPARISON:  CT abdomen pelvis dated 03/10/2015  FINDINGS: Gallbladder:  Gallbladder sludge. No gallbladder wall thickening or pericholecystic fluid. Negative sonographic Murphy's sign.  Common bile duct:  Diameter: 3 mm  Liver:  Hyperechoic hepatic parenchyma with coarse echogenicity. No focal hepatic lesion is seen.  Additional comments: Small volume perihepatic ascites. Right pleural effusion.  IMPRESSION: Gallbladder sludge. No associated sonographic findings to suggest acute cholecystitis.  No intrahepatic or extrahepatic ductal dilatation.  Suspected hepatic steatosis.   Electronically Signed   By: Julian Hy M.D.   On: 03/13/2015 11:13    Scheduled Meds: . antiseptic oral rinse  7 mL Mouth Rinse q12n4p  . bisacodyl  10 mg Rectal BID  . cefTRIAXone (ROCEPHIN)  IV  2 g Intravenous Q24H  . chlorhexidine  15 mL Mouth Rinse BID  . lisinopril  10 mg Oral Daily  . loratadine  10 mg Oral Daily  . niacin  100 mg Oral QHS  . pantoprazole (PROTONIX) IV  40 mg Intravenous QHS   Continuous Infusions: . sodium chloride 100 mL/hr at  03/14/15 1930    Active Problems:   HTN (hypertension)   Pancreatitis   LFT elevation   Leukocytosis   Acute pancreatitis   Ileus    Time spent: 15 minutes    Lenda Kelp  [facility] Pager [phone] If 7PM-7AM, please contact night-coverage at [email or web address], password [facility] 03/15/2015, 9:45 AM  LOS: 5 days     I have taken an interval history, reviewed the chart and examined the patient. I agree with the  PA student's note, impression and recommendations. I have made  any necessary editorial changes. 59 yr old male with likely gall stone pancreatitis, now resolved. Surgery following , plan for laparoscopic cholecystectomy in am.

## 2015-03-15 NOTE — Progress Notes (Signed)
Patient ID: Travis Ellis, male   DOB: 04/11/1956, 59 y.o.   MRN: [id number]     [facility]      [address] [place], [place] [address]    Phone: [phone] FAX: [phone]     Subjective: He complains of lower abdominal pain.  No upper abd pain.  He is hungry. No n/v.  VSS.  Afebrile.  WBC remains up.  LFTs are down.  K is better, but still low.    Objective:  Vital signs:  Filed Vitals:   03/13/15 2224 03/14/15 0606 03/14/15 1327 03/15/15 0601  BP: 164/85 158/87 160/83 163/80  Pulse: 108 107 109 108  Temp: 98.7 F (37.1 C) 97.7 F (36.5 C) 98 F (36.7 C) 98.1 F (36.7 C)  TempSrc: Oral Oral Axillary Oral  Resp: 18 18 20 20   Height:      Weight:      SpO2: 94% 94% 96% 93%    Last BM Date: 03/14/15  Intake/Output   Yesterday:  07/12 0701 - 07/13 0700 In: 3405 [I.V.:2505; IV Piggyback:900] Out: 725 [Urine:725] This shift:    I/O last 3 completed shifts: In: 9373 [I.V.:3520; IV Piggyback:1100] Out: 1575 [Urine:1575]      Physical Exam: General: Pt awake/alert/oriented x4 in no acute distress  Abdomen: Soft. Nondistended. Obese. Minimal pelvic region. No evidence of peritonitis. No incarcerated hernias.     Problem List:   Active Problems:   HTN (hypertension)   Pancreatitis   LFT elevation   Leukocytosis   Acute pancreatitis   Ileus    Results:   Labs: Results for orders placed or performed during the hospital encounter of 03/10/15 (from the past 48 hour(s))  Basic metabolic panel     Status: Abnormal   Collection Time: 03/14/15  5:07 AM  Result Value Ref Range   Sodium 141 135 - 145 mmol/L   Potassium 2.9 (L) 3.5 - 5.1 mmol/L   Chloride 101 101 - 111 mmol/L   CO2 30 22 - 32 mmol/L   Glucose, Bld 81 65 - 99 mg/dL   BUN 21 (H) 6 - 20 mg/dL   Creatinine, Ser 0.50 (L) 0.61 - 1.24 mg/dL   Calcium 7.9 (L) 8.9 - 10.3 mg/dL   GFR calc non Af Amer >60 >60 mL/min   GFR calc Af Amer >60 >60  mL/min    Comment: (NOTE) The eGFR has been calculated using the CKD EPI equation. This calculation has not been validated in all clinical situations. eGFR's persistently <60 mL/min signify possible Chronic Kidney Disease.    Anion gap 10 5 - 15  CBC     Status: Abnormal   Collection Time: 03/14/15  5:07 AM  Result Value Ref Range   WBC 17.2 (H) 4.0 - 10.5 K/uL   RBC 4.48 4.22 - 5.81 MIL/uL   Hemoglobin 14.9 13.0 - 17.0 g/dL   HCT 43.5 39.0 - 52.0 %   MCV 97.1 78.0 - 100.0 fL   MCH 33.3 26.0 - 34.0 pg   MCHC 34.3 30.0 - 36.0 g/dL   RDW 13.0 11.5 - 15.5 %   Platelets 239 150 - 400 K/uL  Hepatic function panel     Status: Abnormal   Collection Time: 03/14/15  5:07 AM  Result Value Ref Range   Total Protein 6.3 (L) 6.5 - 8.1 g/dL   Albumin 2.9 (L) 3.5 - 5.0 g/dL   AST 38 15 - 41 U/L   ALT  88 (H) 17 - 63 U/L   Alkaline Phosphatase 68 38 - 126 U/L   Total Bilirubin 1.6 (H) 0.3 - 1.2 mg/dL   Bilirubin, Direct 0.4 0.1 - 0.5 mg/dL   Indirect Bilirubin 1.2 (H) 0.3 - 0.9 mg/dL  Lipase, blood     Status: Abnormal   Collection Time: 03/14/15  5:07 AM  Result Value Ref Range   Lipase 16 (L) 22 - 51 U/L  Magnesium     Status: None   Collection Time: 03/14/15  5:07 AM  Result Value Ref Range   Magnesium 2.4 1.7 - 2.4 mg/dL  Basic metabolic panel     Status: Abnormal   Collection Time: 03/14/15  5:07 PM  Result Value Ref Range   Sodium 144 135 - 145 mmol/L   Potassium 2.8 (L) 3.5 - 5.1 mmol/L   Chloride 105 101 - 111 mmol/L   CO2 29 22 - 32 mmol/L   Glucose, Bld 85 65 - 99 mg/dL   BUN 21 (H) 6 - 20 mg/dL   Creatinine, Ser 0.49 (L) 0.61 - 1.24 mg/dL   Calcium 8.3 (L) 8.9 - 10.3 mg/dL   GFR calc non Af Amer >60 >60 mL/min   GFR calc Af Amer >60 >60 mL/min    Comment: (NOTE) The eGFR has been calculated using the CKD EPI equation. This calculation has not been validated in all clinical situations. eGFR's persistently <60 mL/min signify possible Chronic Kidney Disease.    Anion  gap 10 5 - 15  Basic metabolic panel     Status: Abnormal   Collection Time: 03/15/15  5:00 AM  Result Value Ref Range   Sodium 147 (H) 135 - 145 mmol/L   Potassium 3.1 (L) 3.5 - 5.1 mmol/L   Chloride 106 101 - 111 mmol/L   CO2 30 22 - 32 mmol/L   Glucose, Bld 78 65 - 99 mg/dL   BUN 20 6 - 20 mg/dL   Creatinine, Ser 0.50 (L) 0.61 - 1.24 mg/dL   Calcium 8.4 (L) 8.9 - 10.3 mg/dL   GFR calc non Af Amer >60 >60 mL/min   GFR calc Af Amer >60 >60 mL/min    Comment: (NOTE) The eGFR has been calculated using the CKD EPI equation. This calculation has not been validated in all clinical situations. eGFR's persistently <60 mL/min signify possible Chronic Kidney Disease.    Anion gap 11 5 - 15  Lipase, blood     Status: Abnormal   Collection Time: 03/15/15  5:00 AM  Result Value Ref Range   Lipase 13 (L) 22 - 51 U/L  Hepatic function panel     Status: Abnormal   Collection Time: 03/15/15  5:00 AM  Result Value Ref Range   Total Protein 5.8 (L) 6.5 - 8.1 g/dL   Albumin 2.5 (L) 3.5 - 5.0 g/dL   AST 27 15 - 41 U/L   ALT 62 17 - 63 U/L   Alkaline Phosphatase 83 38 - 126 U/L   Total Bilirubin 1.3 (H) 0.3 - 1.2 mg/dL   Bilirubin, Direct 0.3 0.1 - 0.5 mg/dL   Indirect Bilirubin 1.0 (H) 0.3 - 0.9 mg/dL  CBC     Status: Abnormal   Collection Time: 03/15/15  5:00 AM  Result Value Ref Range   WBC 17.7 (H) 4.0 - 10.5 K/uL   RBC 4.43 4.22 - 5.81 MIL/uL   Hemoglobin 14.9 13.0 - 17.0 g/dL   HCT 44.4 39.0 - 52.0 %   MCV 100.2 (  H) 78.0 - 100.0 fL   MCH 33.6 26.0 - 34.0 pg   MCHC 33.6 30.0 - 36.0 g/dL   RDW 13.5 11.5 - 15.5 %   Platelets 251 150 - 400 K/uL    Imaging / Studies: Dg Abd 1 View  03/13/2015   CLINICAL DATA:  Severe abdominal pain, ileus  EXAM: ABDOMEN - 1 VIEW  COMPARISON:  None.  FINDINGS: Nonobstructive bowel gas pattern.  Mild gaseous distention of the ascending/transverse colon.  Visualized osseous structures are within normal limits.  IMPRESSION: Normal abdominal radiographs.    Electronically Signed   By: Julian Hy M.D.   On: 03/13/2015 11:17   US Abdomen Limited Ruq  03/13/2015   CLINICAL DATA:  Acute pancreatitis, right upper quadrant pain, history of prostate cancer status post prostatectomy  EXAM: US ABDOMEN LIMITED - RIGHT UPPER QUADRANT  COMPARISON:  CT abdomen pelvis dated 03/10/2015  FINDINGS: Gallbladder:  Gallbladder sludge. No gallbladder wall thickening or pericholecystic fluid. Negative sonographic Murphy's sign.  Common bile duct:  Diameter: 3 mm  Liver:  Hyperechoic hepatic parenchyma with coarse echogenicity. No focal hepatic lesion is seen.  Additional comments: Small volume perihepatic ascites. Right pleural effusion.  IMPRESSION: Gallbladder sludge. No associated sonographic findings to suggest acute cholecystitis.  No intrahepatic or extrahepatic ductal dilatation.  Suspected hepatic steatosis.   Electronically Signed   By: Julian Hy M.D.   On: 03/13/2015 11:13    Medications / Allergies:  Scheduled Meds: . antiseptic oral rinse  7 mL Mouth Rinse q12n4p  . cefTRIAXone (ROCEPHIN)  IV  2 g Intravenous Q24H  . chlorhexidine  15 mL Mouth Rinse BID  . lisinopril  10 mg Oral Daily  . loratadine  10 mg Oral Daily  . niacin  100 mg Oral QHS  . pantoprazole (PROTONIX) IV  40 mg Intravenous QHS   Continuous Infusions: . sodium chloride 100 mL/hr at 03/14/15 1930   PRN Meds:.hydrALAZINE, HYDROmorphone (DILAUDID) injection, ondansetron **OR** ondansetron (ZOFRAN) IV  Antibiotics: Anti-infectives    Start     Dose/Rate Route Frequency Ordered Stop   03/15/15 0900  cefTRIAXone (ROCEPHIN) 2 g in dextrose 5 % 50 mL IVPB - Premix    Comments:  Pharmacy may adjust dosing strength / duration / interval for maximal efficacy   2 g 100 mL/hr over 30 Minutes Intravenous Every 24 hours 03/15/15 0828     03/12/15 1100  Ampicillin-Sulbactam (UNASYN) 3 g in sodium chloride 0.9 % 100 mL IVPB  Status:  Discontinued     3 g 100 mL/hr over 60 Minutes  Intravenous 4 times per day 03/12/15 1024 03/15/15 0828        Assessment/Plan Acute pancreatitis Abnormal LFTs Gallbladder sludge Plan for laparoscopic cholecystectomy with IOC tomorrow.  Surgical risks discussed including but not limited to infection, bleeding, injury to surrounding structures and anesthesia risks.  The patient verbalizes understanding and wishes to proceed.  Repeat labs in AM to ensure K is stable.  Replenish per medicine.  Consider adding to maintenance IVF The patient needs to mobilize at least 6 times per day ID-change Unasyn to rocephin pre op FEN-give clears, may advance to fulls for lunch and dinner, then NPO after midnight.  Erby Pian, [facility] Pager [phone]([id number])   03/15/2015 8:43 AM

## 2015-03-15 NOTE — Progress Notes (Signed)
    Progress Note   Subjective  looks and feels better today. Still some gas type discomfort in lower abdomen.    Objective   Vital signs in last 24 hours: Temp:  [98 F (36.7 C)-98.1 F (36.7 C)] 98.1 F (36.7 C) (07/13 0601) Pulse Rate:  [108-109] 108 (07/13 0601) Resp:  [20] 20 (07/13 0601) BP: (160-163)/(80-83) 163/80 mmHg (07/13 0601) SpO2:  [93 %-96 %] 93 % (07/13 0601) Last BM Date: 03/14/15 General:    Pleasant white male in NAD Abdomen:  Soft, minimal upper abdominal tenderness. Moderately distended and tympanitic.  Neurologic:  Alert and oriented,  grossly normal neurologically. Psych:  Cooperative. Normal mood and affect.    Lab Results:  Recent Labs  03/13/15 0522 03/14/15 0507 03/15/15 0500  WBC 18.6* 17.2* 17.7*  HGB 15.1 14.9 14.9  HCT 43.8 43.5 44.4  PLT 209 239 251   BMET  Recent Labs  03/14/15 0507 03/14/15 1707 03/15/15 0500  NA 141 144 147*  K 2.9* 2.8* 3.1*  CL 101 105 106  CO2 30 29 30   GLUCOSE 81 85 78  BUN 21* 21* 20  CREATININE 0.50* 0.49* 0.50*  CALCIUM 7.9* 8.3* 8.4*   LFT  Recent Labs  03/15/15 0500  PROT 5.8*  ALBUMIN 2.5*  AST 27  ALT 62  ALKPHOS 83  BILITOT 1.3*  BILIDIR 0.3  IBILI 1.0*     Assessment / Plan:   1. Acute pancreatitis, presumably biliary (gallbladder sludge on u/s). Initial ultrasound showed only sludge, no stones. LFTs nearly normal now. For cholecystectomy with IOC tomorrow. His pain has improved but still getting frequent pain meds.   2. Bloating / lower abdominal cramps. Mild gaseous distention of bowel on films a couple of days ago.  Improved but not resolved. Will try dulcolax suppositories. Ambulation and normalization of K+ should help bowel function. He is taking frequent Dilaudid so this is probably contributing to decreased bowel function.   3. Hypokalemia. Hard time getting K+ up, only at 3.1 today. He has had numerous runs of IV K+.  Preferably we could get potassium up to 4.   LOS: 5  days   Tye Savoy  03/15/2015, 11:05 AM

## 2015-03-16 ENCOUNTER — Inpatient Hospital Stay (HOSPITAL_COMMUNITY): Payer: BLUE CROSS/BLUE SHIELD | Admitting: Registered Nurse

## 2015-03-16 ENCOUNTER — Encounter (HOSPITAL_COMMUNITY): Payer: Self-pay

## 2015-03-16 ENCOUNTER — Encounter (HOSPITAL_COMMUNITY)
Admission: EM | Disposition: A | Discharge status: Home / Self Care | Payer: Self-pay | Source: Home / Self Care | Attending: Internal Medicine

## 2015-03-16 ENCOUNTER — Inpatient Hospital Stay (HOSPITAL_COMMUNITY): Payer: BLUE CROSS/BLUE SHIELD

## 2015-03-16 HISTORY — PX: CHOLECYSTECTOMY: SHX55

## 2015-03-16 LAB — CBC
HCT: 44.2 % (ref 39.0–52.0)
HEMOGLOBIN: 14.9 g/dL (ref 13.0–17.0)
MCH: 33.5 pg (ref 26.0–34.0)
MCHC: 33.7 g/dL (ref 30.0–36.0)
MCV: 99.3 fL (ref 78.0–100.0)
Platelets: 247 10*3/uL (ref 150–400)
RBC: 4.45 MIL/uL (ref 4.22–5.81)
RDW: 13.4 % (ref 11.5–15.5)
WBC: 18.8 10*3/uL — ABNORMAL HIGH (ref 4.0–10.5)

## 2015-03-16 LAB — COMPREHENSIVE METABOLIC PANEL
ALBUMIN: 2.5 g/dL — AB (ref 3.5–5.0)
ALK PHOS: 77 U/L (ref 38–126)
ALT: 58 U/L (ref 17–63)
ANION GAP: 7 (ref 5–15)
AST: 33 U/L (ref 15–41)
BUN: 15 mg/dL (ref 6–20)
CO2: 27 mmol/L (ref 22–32)
CREATININE: 0.47 mg/dL — AB (ref 0.61–1.24)
Calcium: 8.1 mg/dL — ABNORMAL LOW (ref 8.9–10.3)
Chloride: 106 mmol/L (ref 101–111)
GLUCOSE: 106 mg/dL — AB (ref 65–99)
Potassium: 3.8 mmol/L (ref 3.5–5.1)
Sodium: 140 mmol/L (ref 135–145)
Total Bilirubin: 1.1 mg/dL (ref 0.3–1.2)
Total Protein: 5.8 g/dL — ABNORMAL LOW (ref 6.5–8.1)

## 2015-03-16 LAB — SURGICAL PCR SCREEN
MRSA, PCR: NEGATIVE
STAPHYLOCOCCUS AUREUS: NEGATIVE

## 2015-03-16 SURGERY — LAPAROSCOPIC CHOLECYSTECTOMY WITH INTRAOPERATIVE CHOLANGIOGRAM
Anesthesia: General | Site: Abdomen

## 2015-03-16 MED ORDER — ROCURONIUM BROMIDE 100 MG/10ML IV SOLN
INTRAVENOUS | Status: AC
  Filled 2015-03-16: qty 1

## 2015-03-16 MED ORDER — MIDAZOLAM HCL 2 MG/2ML IJ SOLN
INTRAMUSCULAR | Status: AC
  Filled 2015-03-16: qty 2

## 2015-03-16 MED ORDER — LIDOCAINE HCL (CARDIAC) 20 MG/ML IV SOLN
INTRAVENOUS | Status: DC | PRN
  Administered 2015-03-16: 75 mg via INTRAVENOUS

## 2015-03-16 MED ORDER — LACTATED RINGERS IV SOLN
INTRAVENOUS | Status: DC | PRN
  Administered 2015-03-16 (×3): via INTRAVENOUS

## 2015-03-16 MED ORDER — SUCCINYLCHOLINE CHLORIDE 20 MG/ML IJ SOLN
INTRAMUSCULAR | Status: DC | PRN
  Administered 2015-03-16: 140 mg via INTRAVENOUS

## 2015-03-16 MED ORDER — LACTATED RINGERS IV SOLN
INTRAVENOUS | Status: DC
  Administered 2015-03-16: 1000 mL via INTRAVENOUS

## 2015-03-16 MED ORDER — DEXAMETHASONE SODIUM PHOSPHATE 10 MG/ML IJ SOLN
INTRAMUSCULAR | Status: DC | PRN
  Administered 2015-03-16: 10 mg via INTRAVENOUS

## 2015-03-16 MED ORDER — PROMETHAZINE HCL 25 MG/ML IJ SOLN: 6.2500 mg | INTRAMUSCULAR | Status: DC | PRN

## 2015-03-16 MED ORDER — ONDANSETRON HCL 4 MG PO TABS: 4.0000 mg | ORAL_TABLET | Freq: Four times a day (QID) | ORAL | Status: DC | PRN

## 2015-03-16 MED ORDER — MEPERIDINE HCL 50 MG/ML IJ SOLN: 6.2500 mg | INTRAMUSCULAR | Status: DC | PRN

## 2015-03-16 MED ORDER — ONDANSETRON HCL 4 MG/2ML IJ SOLN
INTRAMUSCULAR | Status: AC
  Filled 2015-03-16: qty 2

## 2015-03-16 MED ORDER — ONDANSETRON HCL 4 MG/2ML IJ SOLN
INTRAMUSCULAR | Status: DC | PRN
  Administered 2015-03-16: 4 mg via INTRAVENOUS

## 2015-03-16 MED ORDER — BUPIVACAINE LIPOSOME 1.3 % IJ SUSP
INTRAMUSCULAR | Status: DC | PRN
  Administered 2015-03-16: 20 mL

## 2015-03-16 MED ORDER — LACTATED RINGERS IR SOLN
Status: DC | PRN
  Administered 2015-03-16: 1000 mL

## 2015-03-16 MED ORDER — MIDAZOLAM HCL 5 MG/5ML IJ SOLN
INTRAMUSCULAR | Status: DC | PRN
  Administered 2015-03-16 (×2): 1 mg via INTRAVENOUS

## 2015-03-16 MED ORDER — MORPHINE SULFATE 2 MG/ML IJ SOLN
1.0000 mg | INTRAMUSCULAR | Status: DC | PRN
Start: 2015-03-16 — End: 2015-03-17

## 2015-03-16 MED ORDER — NEOSTIGMINE METHYLSULFATE 10 MG/10ML IV SOLN
INTRAVENOUS | Status: DC | PRN
  Administered 2015-03-16: 4 mg via INTRAVENOUS

## 2015-03-16 MED ORDER — IOHEXOL 300 MG/ML  SOLN
INTRAMUSCULAR | Status: DC | PRN
Start: 2015-03-16 — End: 2015-03-16
  Administered 2015-03-16: 10 mL

## 2015-03-16 MED ORDER — DEXAMETHASONE SODIUM PHOSPHATE 10 MG/ML IJ SOLN
INTRAMUSCULAR | Status: AC
  Filled 2015-03-16: qty 1

## 2015-03-16 MED ORDER — PROPOFOL 10 MG/ML IV BOLUS
INTRAVENOUS | Status: DC | PRN
  Administered 2015-03-16: 200 mg via INTRAVENOUS

## 2015-03-16 MED ORDER — GLYCOPYRROLATE 0.2 MG/ML IJ SOLN
INTRAMUSCULAR | Status: DC | PRN
  Administered 2015-03-16: 0.6 mg via INTRAVENOUS

## 2015-03-16 MED ORDER — SUFENTANIL CITRATE 50 MCG/ML IV SOLN: INTRAVENOUS | Status: DC | PRN

## 2015-03-16 MED ORDER — 0.9 % SODIUM CHLORIDE (POUR BTL) OPTIME
TOPICAL | Status: DC | PRN
  Administered 2015-03-16: 1000 mL

## 2015-03-16 MED ORDER — FENTANYL CITRATE (PF) 250 MCG/5ML IJ SOLN
INTRAMUSCULAR | Status: AC
  Filled 2015-03-16: qty 5

## 2015-03-16 MED ORDER — FENTANYL CITRATE (PF) 100 MCG/2ML IJ SOLN
INTRAMUSCULAR | Status: DC | PRN
  Administered 2015-03-16 (×2): 100 ug via INTRAVENOUS
  Administered 2015-03-16 (×4): 50 ug via INTRAVENOUS
  Administered 2015-03-16: 100 ug via INTRAVENOUS
  Administered 2015-03-16 (×2): 50 ug via INTRAVENOUS

## 2015-03-16 MED ORDER — HEPARIN SODIUM (PORCINE) 5000 UNIT/ML IJ SOLN
5000.0000 [IU] | Freq: Three times a day (TID) | INTRAMUSCULAR | Status: DC
  Administered 2015-03-16 – 2015-03-17 (×2): 5000 [IU] via SUBCUTANEOUS
  Filled 2015-03-16 (×5): qty 1

## 2015-03-16 MED ORDER — PHENYLEPHRINE HCL 10 MG/ML IJ SOLN
INTRAMUSCULAR | Status: DC | PRN
  Administered 2015-03-16: 40 ug via INTRAVENOUS

## 2015-03-16 MED ORDER — LACTATED RINGERS IV SOLN: INTRAVENOUS | Status: DC

## 2015-03-16 MED ORDER — FENTANYL CITRATE (PF) 100 MCG/2ML IJ SOLN: 25.0000 ug | INTRAMUSCULAR | Status: DC | PRN

## 2015-03-16 MED ORDER — HYDROCODONE-ACETAMINOPHEN 5-325 MG PO TABS: 1.0000 | ORAL_TABLET | ORAL | Status: DC | PRN

## 2015-03-16 MED ORDER — FENTANYL CITRATE (PF) 100 MCG/2ML IJ SOLN
INTRAMUSCULAR | Status: AC
  Filled 2015-03-16: qty 2

## 2015-03-16 MED ORDER — LABETALOL HCL 5 MG/ML IV SOLN
INTRAVENOUS | Status: DC | PRN
  Administered 2015-03-16: 2.5 mg via INTRAVENOUS

## 2015-03-16 MED ORDER — ROCURONIUM BROMIDE 100 MG/10ML IV SOLN
INTRAVENOUS | Status: DC | PRN
  Administered 2015-03-16 (×3): 10 mg via INTRAVENOUS
  Administered 2015-03-16: 30 mg via INTRAVENOUS

## 2015-03-16 MED ORDER — ONDANSETRON HCL 4 MG/2ML IJ SOLN
4.0000 mg | Freq: Four times a day (QID) | INTRAMUSCULAR | Status: DC | PRN
Start: 2015-03-16 — End: 2015-03-17

## 2015-03-16 MED ORDER — KCL IN DEXTROSE-NACL 20-5-0.45 MEQ/L-%-% IV SOLN
INTRAVENOUS | Status: DC
  Administered 2015-03-16 – 2015-03-17 (×2): via INTRAVENOUS
  Filled 2015-03-16 (×3): qty 1000

## 2015-03-16 MED ORDER — LIDOCAINE HCL (CARDIAC) 20 MG/ML IV SOLN
INTRAVENOUS | Status: AC
  Filled 2015-03-16: qty 5

## 2015-03-16 MED ORDER — PROPOFOL 10 MG/ML IV BOLUS
INTRAVENOUS | Status: AC
  Filled 2015-03-16: qty 20

## 2015-03-16 SURGICAL SUPPLY — 39 items
APL SKNCLS STERI-STRIP NONHPOA (GAUZE/BANDAGES/DRESSINGS)
APPLIER CLIP ROT 10 11.4 M/L (STAPLE) ×3
APR CLP MED LRG 11.4X10 (STAPLE) ×1
BAG SPEC RTRVL 10 TROC 200 (ENDOMECHANICALS) ×1
BENZOIN TINCTURE PRP APPL 2/3 (GAUZE/BANDAGES/DRESSINGS) IMPLANT
CABLE HIGH FREQUENCY MONO STRZ (ELECTRODE) IMPLANT
CATH REDDICK CHOLANGI 4FR 50CM (CATHETERS) ×3 IMPLANT
CLIP APPLIE ROT 10 11.4 M/L (STAPLE) ×1 IMPLANT
CLOSURE WOUND 1/2 X4 (GAUZE/BANDAGES/DRESSINGS)
COVER MAYO STAND STRL (DRAPES) ×3 IMPLANT
COVER SURGICAL LIGHT HANDLE (MISCELLANEOUS) ×3 IMPLANT
DECANTER SPIKE VIAL GLASS SM (MISCELLANEOUS) ×3 IMPLANT
DRAPE C-ARM 42X120 X-RAY (DRAPES) ×3 IMPLANT
DRAPE LAPAROSCOPIC ABDOMINAL (DRAPES) ×3 IMPLANT
ELECT REM PT RETURN 9FT ADLT (ELECTROSURGICAL) ×3
ELECTRODE REM PT RTRN 9FT ADLT (ELECTROSURGICAL) ×1 IMPLANT
GLOVE BIOGEL M 8.0 STRL (GLOVE) ×3 IMPLANT
GOWN STRL REUS W/TWL XL LVL3 (GOWN DISPOSABLE) ×12 IMPLANT
HANDLE STAPLE EGIA 4 XL (STAPLE) ×2 IMPLANT
HEMOSTAT SURGICEL 4X8 (HEMOSTASIS) IMPLANT
IV CATH AUTO 14GX1.75 SAFE ORG (IV SOLUTION) ×3 IMPLANT
KIT BASIN OR (CUSTOM PROCEDURE TRAY) ×3 IMPLANT
LIQUID BAND (GAUZE/BANDAGES/DRESSINGS) ×3 IMPLANT
POUCH RETRIEVAL ECOSAC 10 (ENDOMECHANICALS) IMPLANT
POUCH RETRIEVAL ECOSAC 10MM (ENDOMECHANICALS) ×2
RELOAD EGIA 45 MED/THCK PURPLE (STAPLE) ×2 IMPLANT
SCISSORS LAP 5X45 EPIX DISP (ENDOMECHANICALS) ×3 IMPLANT
SCRUB PCMX 4 OZ (MISCELLANEOUS) ×3 IMPLANT
SET IRRIG TUBING LAPAROSCOPIC (IRRIGATION / IRRIGATOR) ×3 IMPLANT
SLEEVE XCEL OPT CAN 5 100 (ENDOMECHANICALS) ×6 IMPLANT
STRIP CLOSURE SKIN 1/2X4 (GAUZE/BANDAGES/DRESSINGS) IMPLANT
SUT VIC AB 4-0 SH 18 (SUTURE) ×3 IMPLANT
SYR 20CC LL (SYRINGE) ×3 IMPLANT
TOWEL OR 17X26 10 PK STRL BLUE (TOWEL DISPOSABLE) ×3 IMPLANT
TRAY LAPAROSCOPIC (CUSTOM PROCEDURE TRAY) ×3 IMPLANT
TROCAR BLADELESS OPT 5 100 (ENDOMECHANICALS) ×3 IMPLANT
TROCAR XCEL 12X100 BLDLESS (ENDOMECHANICALS) ×2 IMPLANT
TROCAR XCEL BLUNT TIP 100MML (ENDOMECHANICALS) IMPLANT
TROCAR XCEL NON-BLD 11X100MML (ENDOMECHANICALS) ×3 IMPLANT

## 2015-03-16 NOTE — Anesthesia Preprocedure Evaluation (Signed)
Anesthesia Evaluation  Patient identified by MRN, date of birth, ID band Patient awake    Reviewed: Allergy & Precautions, NPO status , Patient's Chart, lab work & pertinent test results  Airway Mallampati: II  TM Distance: >3 FB Neck ROM: Full    Dental no notable dental hx.    Pulmonary neg pulmonary ROS,  breath sounds clear to auscultation  Pulmonary exam normal       Cardiovascular hypertension, Pt. on medications Normal cardiovascular examRhythm:Regular Rate:Normal     Neuro/Psych negative neurological ROS  negative psych ROS   GI/Hepatic negative GI ROS, Neg liver ROS,   Endo/Other  negative endocrine ROS  Renal/GU negative Renal ROS  negative genitourinary   Musculoskeletal negative musculoskeletal ROS (+)   Abdominal   Peds negative pediatric ROS (+)  Hematology negative hematology ROS (+)   Anesthesia Other Findings   Reproductive/Obstetrics negative OB ROS                             Anesthesia Physical Anesthesia Plan  ASA: II  Anesthesia Plan: General   Post-op Pain Management:    Induction: Intravenous  Airway Management Planned: Oral ETT  Additional Equipment:   Intra-op Plan:   Post-operative Plan: Extubation in OR  Informed Consent: I have reviewed the patients History and Physical, chart, labs and discussed the procedure including the risks, benefits and alternatives for the proposed anesthesia with the patient or authorized representative who has indicated his/her understanding and acceptance.   Dental advisory given  Plan Discussed with: CRNA  Anesthesia Plan Comments:         Anesthesia Quick Evaluation

## 2015-03-16 NOTE — Anesthesia Postprocedure Evaluation (Signed)
  Anesthesia Post-op Note  Patient: Travis Ellis  Procedure(s) Performed: Procedure(s) (LRB): LAPAROSCOPIC CHOLECYSTECTOMY WITH INTRAOPERATIVE CHOLANGIOGRAM (N/A)  Patient Location: PACU  Anesthesia Type: General  Level of Consciousness: awake and alert   Airway and Oxygen Therapy: Patient Spontanous Breathing  Post-op Pain: mild  Post-op Assessment: Post-op Vital signs reviewed, Patient's Cardiovascular Status Stable, Respiratory Function Stable, Patent Airway and No signs of Nausea or vomiting  Last Vitals:  Filed Vitals:   03/16/15 1517  BP: 135/77  Pulse: 98  Temp: 36.9 C  Resp: 16    Post-op Vital Signs: stable   Complications: No apparent anesthesia complications

## 2015-03-16 NOTE — Anesthesia Procedure Notes (Signed)
Procedure Name: Intubation Date/Time: 03/16/2015 11:49 AM Performed by: Ofilia Neas Pre-anesthesia Checklist: Patient identified, Emergency Drugs available, Suction available, Patient being monitored and Timeout performed Patient Re-evaluated:Patient Re-evaluated prior to inductionOxygen Delivery Method: Circle system utilized Preoxygenation: Pre-oxygenation with 100% oxygen Intubation Type: IV induction Ventilation: Two handed mask ventilation required Laryngoscope Size: Mac and 4 Grade View: Grade III Tube type: Oral Tube size: 8.0 mm Number of attempts: 1 Airway Equipment and Method: Bougie stylet Placement Confirmation: positive ETCO2 and breath sounds checked- equal and bilateral Secured at: 22 cm Tube secured with: Tape Dental Injury: Injury to lip  Difficulty Due To: Difficulty was anticipated, Difficult Airway- due to large tongue, Difficult Airway- due to reduced neck mobility, Difficult Airway- due to dentition, Difficult Airway- due to anterior larynx and Difficult Airway- due to limited oral opening Future Recommendations: Recommend- induction with short-acting agent, and alternative techniques readily available Comments: Unable to visualize cords with cricoid pressure/head lift.  Anterior.   Required two providers to mask.  bougee passed under epiglottis, ett over.  blind intubation.  Successful.

## 2015-03-16 NOTE — Transfer of Care (Signed)
Immediate Anesthesia Transfer of Care Note  Patient: Travis Ellis  Procedure(s) Performed: Procedure(s): LAPAROSCOPIC CHOLECYSTECTOMY WITH INTRAOPERATIVE CHOLANGIOGRAM (N/A)  Patient Location: PACU  Anesthesia Type:General  Level of Consciousness: awake, alert , oriented and patient cooperative  Airway & Oxygen Therapy: Patient Spontanous Breathing and Patient connected to face mask oxygen  Post-op Assessment: Report given to RN, Post -op Vital signs reviewed and stable and Patient moving all extremities X 4  Post vital signs: stable  Last Vitals:  Filed Vitals:   03/16/15 0628  BP: 149/88  Pulse: 100  Temp: 36.7 C  Resp: 20    Complications: No apparent anesthesia complications

## 2015-03-16 NOTE — Progress Notes (Signed)
[facility] PROGRESS NOTE  Travis Ellis RKY:[id number] DOB: 1956-03-30 DOA: 03/10/2015 PCP: Kennon Portela, MD  HPI/Subjective: Patient reports that his abdominal pain has "greatly improved". He endorses much flatulence and feeling better after having a solid bowel movement yesterday afternoon after having suppository. He also reports enjoying his meals yesterday which helped him feel better. He endorses "getting up and moving around a lot". Pt inquires about the timing of his lap chole because he wants to "feel as good as I can when I go home".   Assessment/Plan: 1. Acute pancreatitis      - presumably biliary (GB sludge on [place])      - Seen by  GI yesterday      - Pt reports improvement in pain      - Decrease Dilaudid dose        - patient for lap cholecystectomy today with IOC      - Surgery following   2. Leukocytosis      - WBC still elevated at 18.8      - Continue Rocephin 2g daily  as per surgery  3. LFT elevation    - AST, ALT, alk phosph, total bilirubin all WNL    - Albumin remains at 2.5  4. HTN    - BP still elevated but dropped to 149/88 (from 163/80 yesterday)    - patient on  lisinopril    - Continue to titrate bp meds as needed  5. HLD    - on simvastatin    6. Hypokalemia   - K+ has normalized to 3.8 this AM.    - continue IV  NS  + 20 meq kcl   Code Status: Full  Family Communication: Wife at bedside Disposition Plan: as per surgery   Consultants:  GI  General Surgery  Procedures:  None  Antibiotics:  Rocephin 7/13--    Objective: Filed Vitals:   03/16/15 0628  BP: 149/88  Pulse: 100  Temp: 98 F (36.7 C)  Resp: 20    Intake/Output Summary (Last 24 hours) at 03/16/15 0939 Last data filed at 03/16/15 0842  Gross per 24 hour  Intake 2741.67 ml  Output   1000 ml  Net 1741.67 ml   Filed Weights   03/10/15 2300  Weight: 97.3 kg (214 lb 8.1 oz)    Exam:   General:  Pt is somnolent, in no acute  distress  Cardiovascular: RRR, S1 and S2 noted  Respiratory: CTA bilaterally  Abdomen: Soft, non-distended, non-tender, +BS  Extremities: Perfused, no clubbing, no cyanosis  Data Reviewed: Basic Metabolic Panel:  Recent Labs Lab 03/11/15 0513  03/13/15 0522 03/14/15 0507 03/14/15 1707 03/15/15 0500 03/16/15 0506  NA 137  < > 138 141 144 147* 140  K 3.1*  < > 2.6* 2.9* 2.8* 3.1* 3.8  CL 102  < > 100* 101 105 106 106  CO2 26  < > 30 30 29 30 27   GLUCOSE 115*  < > 103* 81 85 78 106*  BUN 23*  < > 20 21* 21* 20 15  CREATININE 0.53*  < > 0.57* 0.50* 0.49* 0.50* 0.47*  CALCIUM 8.3*  < > 8.1* 7.9* 8.3* 8.4* 8.1*  MG 2.0  --   --  2.4  --   --   --   PHOS 3.3  --   --   --   --   --   --   < > = values in this interval not displayed.  Liver Function Tests:  Recent Labs Lab 03/12/15 0516 03/13/15 0522 03/14/15 0507 03/15/15 0500 03/16/15 0506  AST 71* 39 38 27 33  ALT 216* 135* 88* 62 58  ALKPHOS 74 67 68 83 77  BILITOT 1.7* 1.8* 1.6* 1.3* 1.1  PROT 6.1* 6.4* 6.3* 5.8* 5.8*  ALBUMIN 3.2* 3.1* 2.9* 2.5* 2.5*    Recent Labs Lab 03/10/15 1636 03/11/15 0513 03/12/15 0516 03/14/15 0507 03/15/15 0500  LIPASE 786* 324* 96* 16* 13*   No results for input(s): AMMONIA in the last 168 hours. CBC:  Recent Labs Lab 03/10/15 1636  03/12/15 0516 03/13/15 0522 03/14/15 0507 03/15/15 0500 03/16/15 0506  WBC 13.4*  < > 22.8* 18.6* 17.2* 17.7* 18.8*  NEUTROABS 12.6*  --   --   --   --   --   --   HGB 17.6*  < > 15.5 15.1 14.9 14.9 14.9  HCT 49.1  < > 46.4 43.8 43.5 44.4 44.2  MCV 95.3  < > 100.9* 99.1 97.1 100.2* 99.3  PLT 228  < > 204 209 239 251 247  < > = values in this interval not displayed. Cardiac Enzymes: No results for input(s): CKTOTAL, CKMB, CKMBINDEX, TROPONINI in the last 168 hours. BNP (last 3 results) No results for input(s): BNP in the last 8760 hours.  ProBNP (last 3 results) No results for input(s): PROBNP in the last 8760 hours.  CBG: No  results for input(s): GLUCAP in the last 168 hours.  No results found for this or any previous visit (from the past 240 hour(s)).   Studies: No results found.  Scheduled Meds: . antiseptic oral rinse  7 mL Mouth Rinse q12n4p  . bisacodyl  10 mg Rectal BID  . cefTRIAXone (ROCEPHIN)  IV  2 g Intravenous Q24H  . chlorhexidine  15 mL Mouth Rinse BID  . lisinopril  10 mg Oral Daily  . loratadine  10 mg Oral Daily  . niacin  100 mg Oral QHS  . pantoprazole (PROTONIX) IV  40 mg Intravenous QHS   Continuous Infusions: . sodium chloride 0.9 % 1,000 mL with potassium chloride 20 mEq infusion 100 mL/hr at 03/15/15 1106    Active Problems:   HTN (hypertension)   Pancreatitis   LFT elevation   Leukocytosis   Acute pancreatitis   Ileus    Time spent: 25 minutes    Brandi L. Guerry Bruin, PA-S  [facility] Pager [phone]. If 7PM-7AM, please contact night-coverage at [email or web address], password [facility] 03/16/2015, 9:39 AM  LOS: 6 days      I have taken an interval history, reviewed the chart and examined the patient. I agree with the PA student's note, impression and recommendations. I have made any necessary editorial changes. 59 yr old male with biliary pancreatitis, seen by GI and surgery, patient today ready for lap chole and IOC per surgery. Potassium today is 3.8.

## 2015-03-16 NOTE — Brief Op Note (Signed)
03/10/2015 - 03/16/2015  1:50 PM  PATIENT:  Travis Ellis  59 y.o. male  PRE-OPERATIVE DIAGNOSIS:  billary pancreatitis  POST-OPERATIVE DIAGNOSIS:  biliary pancreatitis  PROCEDURE:  Procedure(s): LAPAROSCOPIC CHOLECYSTECTOMY WITH INTRAOPERATIVE CHOLANGIOGRAM (N/A)  SURGEON:  Surgeon(s) and Role:    * [name], MD - Primary  PHYSICIAN ASSISTANT:   ASSISTANTS: none   ANESTHESIA:   general  EBL:  Total I/O In: 1000 [I.V.:1000] Out: -   BLOOD ADMINISTERED:none  DRAINS: none   LOCAL MEDICATIONS USED:  BUPIVICAINE   SPECIMEN:  Source of Specimen:  gallbladder  DISPOSITION OF SPECIMEN:  PATHOLOGY  COUNTS:  YES  TOURNIQUET:  * No tourniquets in log *  DICTATION: .Other Dictation: Dictation Number [id number]  PLAN OF CARE: back to the floor after PACU  PATIENT DISPOSITION:  PACU - hemodynamically stable.   Delay start of Pharmacological VTE agent (>24hrs) due to surgical blood loss or risk of bleeding: no

## 2015-03-16 NOTE — Progress Notes (Signed)
Patient ID: Travis Ellis, male   DOB: October 29, 1955, 59 y.o.   MRN: [id number]     [facility]      [address] [place], [place] [address]    Phone: [phone] FAX: [phone]     Subjective: No pain.  No n/v. WBC remains up, no fevers.  No clear source.   Objective:  Vital signs:  Filed Vitals:   03/15/15 0601 03/15/15 1500 03/15/15 2035 03/16/15 0628  BP: 163/80 162/82 151/87 149/88  Pulse: 108 108 105 100  Temp: 98.1 F (36.7 C) 98.1 F (36.7 C) 98.5 F (36.9 C) 98 F (36.7 C)  TempSrc: Oral Oral Oral Oral  Resp: _0 Height:      Weight:      SpO2: 93% 94% 94% 95%    Last BM Date: 03/15/15  Intake/Output   Yesterday:  07/13 0701 - 07/14 0700 In: 2791.7 [P.O.:1140; I.V.:1601.7; IV Piggyback:50] Out: 1000 [Urine:1000] This shift:    I/O last 3 completed shifts: In: 6196.7 [P.O.:1140; I.V.:4106.7; IV Piggyback:950] Out: 1300 [Urine:1300]    Physical Exam: General: Pt awake/alert/oriented x4 in no acute distress Abdomen: Soft.  Nondistended.  nontender.  No evidence of peritonitis.  No incarcerated hernias.    Problem List:   Active Problems:   HTN (hypertension)   Pancreatitis   LFT elevation   Leukocytosis   Acute pancreatitis   Ileus    Results:   Labs: Results for orders placed or performed during the hospital encounter of 03/10/15 (from the past 48 hour(s))  Basic metabolic panel     Status: Abnormal   Collection Time: 03/14/15  5:07 PM  Result Value Ref Range   Sodium 144 135 - 145 mmol/L   Potassium 2.8 (L) 3.5 - 5.1 mmol/L   Chloride 105 101 - 111 mmol/L   CO2 29 22 - 32 mmol/L   Glucose, Bld 85 65 - 99 mg/dL   BUN 21 (H) 6 - 20 mg/dL   Creatinine, Ser 0.49 (L) 0.61 - 1.24 mg/dL   Calcium 8.3 (L) 8.9 - 10.3 mg/dL   GFR calc non Af Amer >60 >60 mL/min   GFR calc Af Amer >60 >60 mL/min    Comment: (NOTE) The eGFR has been calculated using the CKD EPI equation. This  calculation has not been validated in all clinical situations. eGFR's persistently <60 mL/min signify possible Chronic Kidney Disease.    Anion gap 10 5 - 15  Basic metabolic panel     Status: Abnormal   Collection Time: 03/15/15  5:00 AM  Result Value Ref Range   Sodium 147 (H) 135 - 145 mmol/L   Potassium 3.1 (L) 3.5 - 5.1 mmol/L   Chloride 106 101 - 111 mmol/L   CO2 30 22 - 32 mmol/L   Glucose, Bld 78 65 - 99 mg/dL   BUN 20 6 - 20 mg/dL   Creatinine, Ser 0.50 (L) 0.61 - 1.24 mg/dL   Calcium 8.4 (L) 8.9 - 10.3 mg/dL   GFR calc non Af Amer >60 >60 mL/min   GFR calc Af Amer >60 >60 mL/min    Comment: (NOTE) The eGFR has been calculated using the CKD EPI equation. This calculation has not been validated in all clinical situations. eGFR's persistently <60 mL/min signify possible Chronic Kidney Disease.    Anion gap 11 5 - 15  Lipase, blood     Status: Abnormal   Collection Time: 03/15/15  5:00  AM  Result Value Ref Range   Lipase 13 (L) 22 - 51 U/L  Hepatic function panel     Status: Abnormal   Collection Time: 03/15/15  5:00 AM  Result Value Ref Range   Total Protein 5.8 (L) 6.5 - 8.1 g/dL   Albumin 2.5 (L) 3.5 - 5.0 g/dL   AST 27 15 - 41 U/L   ALT 62 17 - 63 U/L   Alkaline Phosphatase 83 38 - 126 U/L   Total Bilirubin 1.3 (H) 0.3 - 1.2 mg/dL   Bilirubin, Direct 0.3 0.1 - 0.5 mg/dL   Indirect Bilirubin 1.0 (H) 0.3 - 0.9 mg/dL  CBC     Status: Abnormal   Collection Time: 03/15/15  5:00 AM  Result Value Ref Range   WBC 17.7 (H) 4.0 - 10.5 K/uL   RBC 4.43 4.22 - 5.81 MIL/uL   Hemoglobin 14.9 13.0 - 17.0 g/dL   HCT 44.4 39.0 - 52.0 %   MCV 100.2 (H) 78.0 - 100.0 fL   MCH 33.6 26.0 - 34.0 pg   MCHC 33.6 30.0 - 36.0 g/dL   RDW 13.5 11.5 - 15.5 %   Platelets 251 150 - 400 K/uL  CBC     Status: Abnormal   Collection Time: 03/16/15  5:06 AM  Result Value Ref Range   WBC 18.8 (H) 4.0 - 10.5 K/uL   RBC 4.45 4.22 - 5.81 MIL/uL   Hemoglobin 14.9 13.0 - 17.0 g/dL   HCT 44.2  39.0 - 52.0 %   MCV 99.3 78.0 - 100.0 fL   MCH 33.5 26.0 - 34.0 pg   MCHC 33.7 30.0 - 36.0 g/dL   RDW 13.4 11.5 - 15.5 %   Platelets 247 150 - 400 K/uL  Comprehensive metabolic panel     Status: Abnormal   Collection Time: 03/16/15  5:06 AM  Result Value Ref Range   Sodium 140 135 - 145 mmol/L   Potassium 3.8 3.5 - 5.1 mmol/L    Comment: DELTA CHECK NOTED REPEATED TO VERIFY NO VISIBLE HEMOLYSIS    Chloride 106 101 - 111 mmol/L   CO2 27 22 - 32 mmol/L   Glucose, Bld 106 (H) 65 - 99 mg/dL   BUN 15 6 - 20 mg/dL   Creatinine, Ser 0.47 (L) 0.61 - 1.24 mg/dL   Calcium 8.1 (L) 8.9 - 10.3 mg/dL   Total Protein 5.8 (L) 6.5 - 8.1 g/dL   Albumin 2.5 (L) 3.5 - 5.0 g/dL   AST 33 15 - 41 U/L   ALT 58 17 - 63 U/L   Alkaline Phosphatase 77 38 - 126 U/L   Total Bilirubin 1.1 0.3 - 1.2 mg/dL   GFR calc non Af Amer >60 >60 mL/min   GFR calc Af Amer >60 >60 mL/min    Comment: (NOTE) The eGFR has been calculated using the CKD EPI equation. This calculation has not been validated in all clinical situations. eGFR's persistently <60 mL/min signify possible Chronic Kidney Disease.    Anion gap 7 5 - 15    Imaging / Studies: No results found.  Medications / Allergies:  Scheduled Meds: . antiseptic oral rinse  7 mL Mouth Rinse q12n4p  . bisacodyl  10 mg Rectal BID  . cefTRIAXone (ROCEPHIN)  IV  2 g Intravenous Q24H  . chlorhexidine  15 mL Mouth Rinse BID  . lisinopril  10 mg Oral Daily  . loratadine  10 mg Oral Daily  . niacin  100 mg Oral QHS  .  pantoprazole (PROTONIX) IV  40 mg Intravenous QHS   Continuous Infusions: . sodium chloride 0.9 % 1,000 mL with potassium chloride 20 mEq infusion 100 mL/hr at 03/15/15 1106   PRN Meds:.hydrALAZINE, HYDROmorphone (DILAUDID) injection, ondansetron **OR** ondansetron (ZOFRAN) IV  Antibiotics: Anti-infectives    Start     Dose/Rate Route Frequency Ordered Stop   03/15/15 0900  cefTRIAXone (ROCEPHIN) 2 g in dextrose 5 % 50 mL IVPB - Premix     Comments:  Pharmacy may adjust dosing strength / duration / interval for maximal efficacy   2 g 100 mL/hr over 30 Minutes Intravenous Every 24 hours 03/15/15 0828     03/12/15 1100  Ampicillin-Sulbactam (UNASYN) 3 g in sodium chloride 0.9 % 100 mL IVPB  Status:  Discontinued     3 g 100 mL/hr over 60 Minutes Intravenous 4 times per day 03/12/15 1024 03/15/15 0828      Assessment/Plan Acute pancreatitis Abnormal LFTs Gallbladder sludge -lap chole this am. ID-rocephin. FEN-IVF, NPO, k much better.   Erby Pian, [facility] Pager [phone](7A-4:30P)   03/16/2015 10:27 AM   I have seen and examined this patient and agree with the assessment and plan.  Proceed to OR for lap chole IOC.     Matt B. Hassell Done, MD, [facility] [phone] beeper [phone]  03/16/2015 11:06 AM

## 2015-03-17 ENCOUNTER — Encounter (HOSPITAL_COMMUNITY): Payer: Self-pay | Admitting: Surgery

## 2015-03-17 DIAGNOSIS — I10 Essential (primary) hypertension: Secondary | ICD-10-CM

## 2015-03-17 LAB — COMPREHENSIVE METABOLIC PANEL
ALT: 56 U/L (ref 17–63)
ANION GAP: 6 (ref 5–15)
AST: 43 U/L — AB (ref 15–41)
Albumin: 2.3 g/dL — ABNORMAL LOW (ref 3.5–5.0)
Alkaline Phosphatase: 67 U/L (ref 38–126)
BUN: 16 mg/dL (ref 6–20)
CALCIUM: 8 mg/dL — AB (ref 8.9–10.3)
CHLORIDE: 102 mmol/L (ref 101–111)
CO2: 28 mmol/L (ref 22–32)
Creatinine, Ser: 0.52 mg/dL — ABNORMAL LOW (ref 0.61–1.24)
GFR calc Af Amer: >60 mL/min (ref >60)
GFR calc non Af Amer: >60 mL/min (ref >60)
GLUCOSE: 163 mg/dL — AB (ref 65–99)
Potassium: 4 mmol/L (ref 3.5–5.1)
Sodium: 136 mmol/L (ref 135–145)
Total Bilirubin: 0.5 mg/dL (ref 0.3–1.2)
Total Protein: 5.6 g/dL — ABNORMAL LOW (ref 6.5–8.1)

## 2015-03-17 LAB — CBC
HCT: 41.1 % (ref 39.0–52.0)
Hemoglobin: 13.9 g/dL (ref 13.0–17.0)
MCH: 33.3 pg (ref 26.0–34.0)
MCHC: 33.8 g/dL (ref 30.0–36.0)
MCV: 98.6 fL (ref 78.0–100.0)
PLATELETS: 211 10*3/uL (ref 150–400)
RBC: 4.17 MIL/uL — ABNORMAL LOW (ref 4.22–5.81)
RDW: 13.1 % (ref 11.5–15.5)
WBC: 16.7 10*3/uL — ABNORMAL HIGH (ref 4.0–10.5)

## 2015-03-17 LAB — LIPASE, BLOOD: Lipase: 12 U/L — ABNORMAL LOW (ref 22–51)

## 2015-03-17 LAB — AMYLASE: Amylase: 42 U/L (ref 28–100)

## 2015-03-17 MED ORDER — SODIUM CHLORIDE 0.9 % IJ SOLN: 3.0000 mL | Freq: Two times a day (BID) | INTRAMUSCULAR | Status: DC

## 2015-03-17 MED ORDER — SODIUM CHLORIDE 0.9 % IJ SOLN: 3.0000 mL | INTRAMUSCULAR | Status: DC | PRN

## 2015-03-17 NOTE — Discharge Summary (Signed)
Physician Discharge Summary  Travis Ellis YQM:[id number] DOB: 07-14-1956 DOA: 03/10/2015  PCP: Kennon Portela, MD  Admit date: 03/10/2015 Discharge date: 03/17/2015  Recommendations for Outpatient Follow-up:  1. D/c to home with follow up with surgery in a few weeks 2. PCP in 1-2 weeks for repeat CMP and CBC to f/u transaminitis and leukocytosis and check oxygen levels.  Consider referral for sleep study if not already complete.    Discharge Diagnoses:  Active Problems:   HTN (hypertension)   Pancreatitis   LFT elevation   Leukocytosis   Acute pancreatitis   Ileus   Discharge Condition: stable, improved  Diet recommendation: healthy heart  Wt Readings from Last 3 Encounters:  03/10/15 97.3 kg (214 lb 8.1 oz)  02/01/15 97.16 kg (214 lb 3.2 oz)  10/06/14 95.255 kg (210 lb)    History of present illness:   59 year old male with history of acid reflux, cancer, hypertension, hyperlipidemia, arthritis who presented with abdominal pain, nausea, vomiting and was found to have pancreatitis with a lipase of 786. He also had a transaminitis with a bilirubin up to 4.4. An ultrasound of the abdomen demonstrated no evidence of gallstones or wall thickening.  Hospital Course:   Acute gallstone pancreatitis status post laparoscopic cholecystectomy on 7/14.  He was seen by gastroenterology and by [facility].  Because his liver function tests trended down spontaneously, he underwent cholecystectomy instead of ERCP. His intraoperative cholangiogram demonstrated no evidence of obstructive stones.  He did well postoperatively and was able to tolerate a regular diet prior to discharge.  Leukocytosis, related to acute pancreatitis and remained elevated. He should have a repeat CBC done as an outpatient by his primary care doctor in 1-2 weeks to confirm that his leukocytosis is resolving.  Transaminitis, liver function tests trended down to within normal limits. Recommend repeating in  approximately 1-2 weeks.recommended that he continue to hold his simvastatin until he has repeat liver function test by his primary care doctor.  Essential hypertension,blood pressure was stable prior to discharge on lisinopril.  Hypokalemia, resolved with IV potassium repletion.    Consultants:  GI  [facility]  Procedures:  Laparoscopic cholecystectomy on 7/14 with intraoperative cholangiogram  Antibiotics:  Rocephin 7/13-- 7/14  Discharge Exam: Filed Vitals:   03/17/15 0500  BP: 138/70  Pulse: 85  Temp: 98.1 F (36.7 C)  Resp: 18   Filed Vitals:   03/16/15 1500 03/16/15 1517 03/16/15 2100 03/17/15 0500  BP: 127/76 135/77 126/83 138/70  Pulse: 92 98 92 85  Temp: 98.4 F (36.9 C) 98.5 F (36.9 C) 98.6 F (37 C) 98.1 F (36.7 C)  TempSrc:   Oral Oral  Resp: 16 16 16 18   Height:      Weight:      SpO2: 93% 92% 92%     Ellis: awake alert, no acute distress Cardiovascular: regular rate and rhythm, no murmurs, rubs, or gallops Respiratory: clear to auscultation bilaterally, no rales, rhonchi, or wheeze Abdomen: NABS, mildly distended but nontender, port sites appear non-erythematous, no discharge, Dermabond intact MSK: Normal tone and bulk, no lower extremity edema Neuro: Grossly intact  Discharge Instructions      Discharge Instructions    Call MD for:  difficulty breathing, headache or visual disturbances    Complete by:  As directed      Call MD for:  extreme fatigue    Complete by:  As directed      Call MD for:  hives    Complete by:  As directed      Call MD for:  persistant dizziness or light-headedness    Complete by:  As directed      Call MD for:  persistant nausea and vomiting    Complete by:  As directed      Call MD for:  redness, tenderness, or signs of infection (pain, swelling, redness, odor or green/yellow discharge around incision site)    Complete by:  As directed      Call MD for:  severe uncontrolled pain    Complete by:  As  directed      Call MD for:  temperature >100.4    Complete by:  As directed      Diet - low sodium heart healthy    Complete by:  As directed      Increase activity slowly    Complete by:  As directed             Medication List    STOP taking these medications        acetaminophen 500 MG tablet  Commonly known as:  TYLENOL     azithromycin 500 MG tablet  Commonly known as:  ZITHROMAX     B-complex with vitamin C tablet     dicyclomine 20 MG tablet  Commonly known as:  BENTYL     Krill Oil 300 MG Caps     niacin 100 MG tablet     omeprazole 20 MG capsule  Commonly known as:  PRILOSEC     ondansetron 8 MG disintegrating tablet  Commonly known as:  ZOFRAN ODT     simvastatin 10 MG tablet  Commonly known as:  ZOCOR     simvastatin 20 MG tablet  Commonly known as:  ZOCOR     Vitamin D 2000 UNITS tablet      TAKE these medications        aspirin 325 MG tablet  Take 325 mg by mouth daily.     cetirizine 10 MG tablet  Commonly known as:  ZYRTEC  Take 10 mg by mouth daily.     FISH OIL + D3 PO  Take 300 mg by mouth daily.     HYDROcodone-acetaminophen 5-325 MG per tablet  Commonly known as:  NORCO/VICODIN  Take 1 tablet by mouth every 6 (six) hours as needed for moderate pain or severe pain.     lisinopril-hydrochlorothiazide 10-12.5 MG per tablet  Commonly known as:  PRINZIDE,ZESTORETIC  TAKE 1 TABLET BY MOUTH DAILY.     meloxicam 15 MG tablet  Commonly known as:  MOBIC  Take 1 tablet by mouth Daily.     multivitamin with minerals tablet  Take 1 tablet by mouth daily.       Follow-up Information    Follow up with [facility] On 04/11/2015.   Specialty:  [facility]   Why:  arrive by 1:30PM for a 2PM post operative check in the [facility]   Contact information:   [place] [address] [phone]       Follow up with GUEST, Veneda Melter, MD. Schedule an appointment as soon as possible for a visit in 1  week.   Specialty:  Internal Medicine   Contact information:   [place] [address] [phone]        The results of significant diagnostics from this hospitalization (including imaging, microbiology, ancillary and laboratory) are listed below for reference.    Significant Diagnostic  Studies: Dg Abd 1 View  03/13/2015   CLINICAL DATA:  Severe abdominal pain, ileus  EXAM: ABDOMEN - 1 VIEW  COMPARISON:  None.  FINDINGS: Nonobstructive bowel gas pattern.  Mild gaseous distention of the ascending/transverse colon.  Visualized osseous structures are within normal limits.  IMPRESSION: Normal abdominal radiographs.   Electronically Signed   By: Julian Hy M.D.   On: 03/13/2015 11:17   Dg Cholangiogram Operative  03/16/2015   CLINICAL DATA:  Cholecystectomy for pancreatitis and gallbladder sludge.  EXAM: INTRAOPERATIVE CHOLANGIOGRAM  TECHNIQUE: Cholangiographic images from the C-arm fluoroscopic device were submitted for interpretation post-operatively. Please see the procedural report for the amount of contrast and the fluoroscopy time utilized.  COMPARISON:  Ultrasound on 03/13/2015  FINDINGS: Intraoperative imaging with a C-arm demonstrates opacification of extrahepatic bile ducts with normal caliber and no evidence of filling defects. The cystic duct insertion is fairly low. Visualized intrahepatic ducts are unremarkable. Contrast enters the duodenum normally. No extravasation of contrast is identified.  IMPRESSION: Normal intraoperative cholangiogram.   Electronically Signed   By: Aletta Edouard M.D.   On: 03/16/2015 13:50   US Abdomen Complete  03/10/2015   CLINICAL DATA:  59 year old male with possible gallstone.  EXAM: ULTRASOUND ABDOMEN COMPLETE  COMPARISON:  CT dated 09/29/2014  FINDINGS: Evaluation of the study is limited due to overlying bowel gas and patient's body habitus.  Gallbladder: No gallstones or wall thickening visualized. No sonographic Murphy sign noted.   Common bile duct: Diameter: 3 mm  Liver: Heterogeneous echotexture.  IVC: Not well visualized  Pancreas: Not seen  Spleen: Grossly unremarkable as visualized. There is a small perisplenic free fluid.  Right Kidney: Length: 12 cm.  No hydronephrosis or echogenic stone.  Left Kidney: Length: 14 cm.  Grossly unremarkable as visualized  Abdominal aorta: The proximal and distal portion of the aorta are not visualized. The midportion of the aorta appears unremarkable and measures 2 cm in diameter.  Other findings: None.  IMPRESSION: Limited study due to patient's body habitus and overlying bowel gas. No definite gallstone.  small perisplenic free fluid   Electronically Signed   By: Anner Crete M.D.   On: 03/10/2015 19:40   Ct Abdomen Pelvis W Contrast  03/10/2015   CLINICAL DATA:  Acute onset of generalized abdominal pain, with nausea and vomiting. Initial encounter.  EXAM: CT ABDOMEN AND PELVIS WITH CONTRAST  TECHNIQUE: Multidetector CT imaging of the abdomen and pelvis was performed using the standard protocol following bolus administration of intravenous contrast.  CONTRAST:  13mL OMNIPAQUE IOHEXOL 300 MG/ML  SOLN  COMPARISON:  Abdominal ultrasound performed earlier today at 7:09 p.m., and CT of the abdomen and pelvis from 09/29/2014  FINDINGS: Mild bibasilar atelectasis or scarring is noted.  Mild soft inflammation is noted about the pancreas, with associated free fluid tracking about the upper abdomen and along Gerota's fascia bilaterally. Trace fluid extends into the pelvis. This is most compatible with acute pancreatitis. There is no evidence of devascularization or pseudocyst formation at this time.  The liver and spleen are unremarkable in appearance. The gallbladder is within normal limits. The adrenal glands are unremarkable.  Nonspecific perinephric stranding is noted bilaterally. The kidneys are otherwise unremarkable. There is no evidence of hydronephrosis. No renal or ureteral stones are seen.  No  free fluid is identified. The small bowel is unremarkable in appearance. The stomach is within normal limits. No acute vascular abnormalities are seen. Incidental note is made of a circumaortic left renal vein.  The appendix is normal in caliber, without evidence of appendicitis. The colon is largely decompressed and is unremarkable in appearance.  There is diffuse atrophy of the paraspinal musculature.  The bladder is mildly distended and grossly unremarkable. No inguinal lymphadenopathy is seen.  No acute osseous abnormalities are identified. Anterior osteophytes are seen along the lower thoracic and lumbar spine.  IMPRESSION: 1. Acute pancreatitis, with mild soft tissue inflammation about the pancreas. Associated free fluid tracks about the upper abdomen and along Gerota's fascia bilaterally. Trace fluid extends into the pelvis. No evidence of devascularization or pseudocyst formation at this time. 2. Diffuse atrophy of the paraspinal musculature. 3. Incidental note of a circumaortic left renal vein. 4. Mild bibasilar atelectasis noted.   Electronically Signed   By: Garald Balding M.D.   On: 03/10/2015 23:15   US Abdomen Limited Ruq  03/13/2015   CLINICAL DATA:  Acute pancreatitis, right upper quadrant pain, history of prostate cancer status post prostatectomy  EXAM: US ABDOMEN LIMITED - RIGHT UPPER QUADRANT  COMPARISON:  CT abdomen pelvis dated 03/10/2015  FINDINGS: Gallbladder:  Gallbladder sludge. No gallbladder wall thickening or pericholecystic fluid. Negative sonographic Murphy's sign.  Common bile duct:  Diameter: 3 mm  Liver:  Hyperechoic hepatic parenchyma with coarse echogenicity. No focal hepatic lesion is seen.  Additional comments: Small volume perihepatic ascites. Right pleural effusion.  IMPRESSION: Gallbladder sludge. No associated sonographic findings to suggest acute cholecystitis.  No intrahepatic or extrahepatic ductal dilatation.  Suspected hepatic steatosis.   Electronically Signed   By:  Julian Hy M.D.   On: 03/13/2015 11:13    Microbiology: Recent Results (from the past 240 hour(s))  Surgical pcr screen     Status: None   Collection Time: 03/16/15  7:31 AM  Result Value Ref Range Status   MRSA, PCR NEGATIVE NEGATIVE Final   Staphylococcus aureus NEGATIVE NEGATIVE Final    Comment:        The Xpert SA Assay (FDA approved for NASAL specimens in patients over 24 years of age), is one component of a comprehensive surveillance program.  Test performance has been validated by [facility] for patients greater than or equal to 40 year old. It is not intended to diagnose infection nor to guide or monitor treatment.      Labs: Basic Metabolic Panel:  Recent Labs Lab 03/11/15 0513  03/14/15 0507 03/14/15 1707 03/15/15 0500 03/16/15 0506 03/17/15 0515  NA 137  < > 141 144 147* 140 136  K 3.1*  < > 2.9* 2.8* 3.1* 3.8 4.0  CL 102  < > 101 105 106 106 102  CO2 26  < > 30 29 30 27 28   GLUCOSE 115*  < > 81 85 78 106* 163*  BUN 23*  < > 21* 21* 20 15 16   CREATININE 0.53*  < > 0.50* 0.49* 0.50* 0.47* 0.52*  CALCIUM 8.3*  < > 7.9* 8.3* 8.4* 8.1* 8.0*  MG 2.0  --  2.4  --   --   --   --   PHOS 3.3  --   --   --   --   --   --   < > = values in this interval not displayed. Liver Function Tests:  Recent Labs Lab 03/13/15 0522 03/14/15 0507 03/15/15 0500 03/16/15 0506 03/17/15 0515  AST 39 38 27 33 43*  ALT 135* 88* 62 58 56  ALKPHOS 67 68 83 77 67  BILITOT 1.8* 1.6* 1.3* 1.1 0.5  PROT  6.4* 6.3* 5.8* 5.8* 5.6*  ALBUMIN 3.1* 2.9* 2.5* 2.5* 2.3*    Recent Labs Lab 03/11/15 0513 03/12/15 0516 03/14/15 0507 03/15/15 0500 03/17/15 0515  LIPASE 324* 96* 16* 13* 12*  AMYLASE  --   --   --   --  42   No results for input(s): AMMONIA in the last 168 hours. CBC:  Recent Labs Lab 03/10/15 1636  03/13/15 0522 03/14/15 0507 03/15/15 0500 03/16/15 0506 03/17/15 0515  WBC 13.4*  < > 18.6* 17.2* 17.7* 18.8* 16.7*  NEUTROABS 12.6*  --   --   --    --   --   --   HGB 17.6*  < > 15.1 14.9 14.9 14.9 13.9  HCT 49.1  < > 43.8 43.5 44.4 44.2 41.1  MCV 95.3  < > 99.1 97.1 100.2* 99.3 98.6  PLT 228  < > 209 239 251 247 211  < > = values in this interval not displayed. Cardiac Enzymes: No results for input(s): CKTOTAL, CKMB, CKMBINDEX, TROPONINI in the last 168 hours. BNP: BNP (last 3 results) No results for input(s): BNP in the last 8760 hours.  ProBNP (last 3 results) No results for input(s): PROBNP in the last 8760 hours.  CBG: No results for input(s): GLUCAP in the last 168 hours.  Time coordinating discharge: 35 minutes  Signed:  Xane Ellis  [facility] 03/17/2015, 10:59 AM

## 2015-03-17 NOTE — Progress Notes (Signed)
Patient given discharge instructions, and verbalized an understanding of all discharge instructions.  Patient agrees with discharge plan, and is being discharged in stable medical condition.  Patient given transportation via wheelchair. 

## 2015-03-17 NOTE — Discharge Instructions (Signed)

## 2015-03-17 NOTE — Op Note (Signed)
NAMEMarland Kitchen  SPARROW, SANZO NO.:  [id number]  MEDICAL RECORD NO.:  [id number]  LOCATION:  1501                         [facility]ACILITY[facility]  PHYSICIAN:  Isabel Caprice. Hassell Done, MD  DATE OF BIRTH:  04-18-1956  DATE OF PROCEDURE:  03/16/2015 DATE OF DISCHARGE:                              OPERATIVE REPORT   PREOPERATIVE DIAGNOSIS:  Biliary pancreatitis.  PROCEDURE:  Laparoscopic cholecystectomy with intraoperative cholangiogram.  FINDINGS:  Markedly distorted gallbladder with puckering and fatty infiltration, sludge and stone material consistent with producing biliary pancreatitis.  SURGEON:  Isabel Caprice. Hassell Done, MD.  ASSISTANT:  None.  ANESTHESIA:  General endotracheal.  DESCRIPTION OF PROCEDURE:  The patient was taken to room 4 at [facility] and given general anesthesia.  The patient is a __________ patient. He is a 59 year old white male, who had been previously admitted with biliary pancreatitis.  Following intubation, the abdomen was prepped with PCMX and draped sterilely.  I entered the abdomen through the right upper quadrant using a 5 mm 0 degree Optiview without difficulty.  After insufflation, I placed a 5 in the umbilicus for the camera, another 5 on the right side laterally to hold the gallbladder, and I put an 11 in the upper midline, later upgraded to a 12.  The findings initially were remarkable, and the patient had some dystrophic calcifications in the fat and saponification fat consistent with pancreatitis.  There was also a tremendous amount of edema and swelling, which limited our operative field.  The gallbladder had a weird adhesion from the dome of the gallbladder up to the level of the anterior peritoneal surface.  The gallbladder cap was pearly white and it was stuck to the colon.  These were taken down and enabled me to grasp the gallbladder and elevated. It was elongated, but basically the wall was pretty well enshrouded in fat.  This went all  the way down into the porta hepatis region and there were really no discernible structures.  I went down and then dissected this out by incising the fat with a hook cautery and attempting to visualize the normal anatomy, but everything underneath the fat seemed to be really scarred down and very densely adherent.  I elected then to go up to the top and gradually dissected out the gallbladder in a top-down fashion using the hook electrocautery.  I carried this down and then brought it down to the funnel and then could see a point where I felt like I could do a cholangiogram.  I put a clip upon, what I felt was the upper portion of the gallbladder, incised this and inserted a Reddick catheter and blew up the balloon.  With that in place, I then performed a cholangiogram, which was surprisingly very near the cystic duct.  There was a long cystic duct present.  There was prompt filling of the intrahepatic ducts and prompt flow into the duodenum.  I had to control this very thick inflamed stump.  I went ahead and used a 12-mm trocar upgrade and inserted the Covidien Endo GIA with a purple load 4.5 mm stapler and stapled across just below my cholecystotomy, where I had done the cholangiogram.  The gallbladder was then placed in a bag and removed.  The area was irrigated.  No bleeding was noted.  The patient seemed to tolerate the procedure well and was taken to recovery room in satisfactory condition.  I did close the 12 mm port with a figure-of-eight suture of 0 Vicryl.  I injected all the ports with Exparel and closed them with 4-0 Vicryl.     Isabel Caprice Hassell Done, MD     MBM/MEDQ  D:  03/16/2015  T:  03/17/2015  Job:  [id number]

## 2015-03-17 NOTE — Progress Notes (Signed)
Patient ID: Travis Ellis, male   DOB: [birth year], 59 y.o.   MRN: [id number]     [facility]      [address] [place], [place] [address]    Phone: [phone] FAX: [phone]     Subjective: Feels much better.  Not taking pain meds.  WBC down.  Afebrile.  Little ambulation.  Pulling 107m on IS.  LFTs are normal.   Objective:  Vital signs:  Filed Vitals:   03/16/15 1500 03/16/15 1517 03/16/15 2100 03/17/15 0500  BP: 127/76 135/77 126/83 138/70  Pulse: 92 98 92 85  Temp: 98.4 F (36.9 C) 98.5 F (36.9 C) 98.6 F (37 C) 98.1 F (36.7 C)  TempSrc:   Oral Oral  Resp: 16 16 16 18   Height:      Weight:      SpO2: 93% 92% 92%     Last BM Date: 03/15/15  Intake/Output   Yesterday:  07/14 0701 - 07/15 0700 In: 4035 [P.O.:480; I.V.:3555] Out: 2050 [Urine:2050] This shift:       Physical Exam: General: Pt awake/alert/oriented x4 in no acute distress  Abdomen: Soft.  Nondistended.. Incisions are c/d/i.   Mildly tender at incisions only.  No evidence of peritonitis.  No incarcerated hernias.    Problem List:   Active Problems:   HTN (hypertension)   Pancreatitis   LFT elevation   Leukocytosis   Acute pancreatitis   Ileus    Results:   Labs: Results for orders placed or performed during the hospital encounter of 03/10/15 (from the past 48 hour(s))  CBC     Status: Abnormal   Collection Time: 03/16/15  5:06 AM  Result Value Ref Range   WBC 18.8 (H) 4.0 - 10.5 K/uL   RBC 4.45 4.22 - 5.81 MIL/uL   Hemoglobin 14.9 13.0 - 17.0 g/dL   HCT 44.2 39.0 - 52.0 %   MCV 99.3 78.0 - 100.0 fL   MCH 33.5 26.0 - 34.0 pg   MCHC 33.7 30.0 - 36.0 g/dL   RDW 13.4 11.5 - 15.5 %   Platelets 247 150 - 400 K/uL  Comprehensive metabolic panel     Status: Abnormal   Collection Time: 03/16/15  5:06 AM  Result Value Ref Range   Sodium 140 135 - 145 mmol/L   Potassium 3.8 3.5 - 5.1 mmol/L    Comment: DELTA CHECK  NOTED REPEATED TO VERIFY NO VISIBLE HEMOLYSIS    Chloride 106 101 - 111 mmol/L   CO2 27 22 - 32 mmol/L   Glucose, Bld 106 (H) 65 - 99 mg/dL   BUN 15 6 - 20 mg/dL   Creatinine, Ser 0.47 (L) 0.61 - 1.24 mg/dL   Calcium 8.1 (L) 8.9 - 10.3 mg/dL   Total Protein 5.8 (L) 6.5 - 8.1 g/dL   Albumin 2.5 (L) 3.5 - 5.0 g/dL   AST 33 15 - 41 U/L   ALT 58 17 - 63 U/L   Alkaline Phosphatase 77 38 - 126 U/L   Total Bilirubin 1.1 0.3 - 1.2 mg/dL   GFR calc non Af Amer >60 >60 mL/min   GFR calc Af Amer >60 >60 mL/min    Comment: (NOTE) The eGFR has been calculated using the CKD EPI equation. This calculation has not been validated in all clinical situations. eGFR's persistently <60 mL/min signify possible Chronic Kidney Disease.    Anion gap 7 5 - 15  Surgical pcr screen  Status: None   Collection Time: 03/16/15  7:31 AM  Result Value Ref Range   MRSA, PCR NEGATIVE NEGATIVE   Staphylococcus aureus NEGATIVE NEGATIVE    Comment:        The Xpert SA Assay (FDA approved for NASAL specimens in patients over 62 years of age), is one component of a comprehensive surveillance program.  Test performance has been validated by [facility] for patients greater than or equal to 17 year old. It is not intended to diagnose infection nor to guide or monitor treatment.   Comprehensive metabolic panel     Status: Abnormal   Collection Time: 03/17/15  5:15 AM  Result Value Ref Range   Sodium 136 135 - 145 mmol/L   Potassium 4.0 3.5 - 5.1 mmol/L   Chloride 102 101 - 111 mmol/L   CO2 28 22 - 32 mmol/L   Glucose, Bld 163 (H) 65 - 99 mg/dL   BUN 16 6 - 20 mg/dL   Creatinine, Ser 0.52 (L) 0.61 - 1.24 mg/dL   Calcium 8.0 (L) 8.9 - 10.3 mg/dL   Total Protein 5.6 (L) 6.5 - 8.1 g/dL   Albumin 2.3 (L) 3.5 - 5.0 g/dL   AST 43 (H) 15 - 41 U/L   ALT 56 17 - 63 U/L   Alkaline Phosphatase 67 38 - 126 U/L   Total Bilirubin 0.5 0.3 - 1.2 mg/dL   GFR calc non Af Amer >60 >60 mL/min   GFR calc Af Amer >60  >60 mL/min    Comment: (NOTE) The eGFR has been calculated using the CKD EPI equation. This calculation has not been validated in all clinical situations. eGFR's persistently <60 mL/min signify possible Chronic Kidney Disease.    Anion gap 6 5 - 15  CBC     Status: Abnormal   Collection Time: 03/17/15  5:15 AM  Result Value Ref Range   WBC 16.7 (H) 4.0 - 10.5 K/uL   RBC 4.17 (L) 4.22 - 5.81 MIL/uL   Hemoglobin 13.9 13.0 - 17.0 g/dL   HCT 41.1 39.0 - 52.0 %   MCV 98.6 78.0 - 100.0 fL   MCH 33.3 26.0 - 34.0 pg   MCHC 33.8 30.0 - 36.0 g/dL   RDW 13.1 11.5 - 15.5 %   Platelets 211 150 - 400 K/uL  Amylase     Status: None   Collection Time: 03/17/15  5:15 AM  Result Value Ref Range   Amylase 42 28 - 100 U/L  Lipase, blood     Status: Abnormal   Collection Time: 03/17/15  5:15 AM  Result Value Ref Range   Lipase 12 (L) 22 - 51 U/L    Imaging / Studies: Dg Cholangiogram Operative  03/16/2015   CLINICAL DATA:  Cholecystectomy for pancreatitis and gallbladder sludge.  EXAM: INTRAOPERATIVE CHOLANGIOGRAM  TECHNIQUE: Cholangiographic images from the C-arm fluoroscopic device were submitted for interpretation post-operatively. Please see the procedural report for the amount of contrast and the fluoroscopy time utilized.  COMPARISON:  Ultrasound on 03/13/2015  FINDINGS: Intraoperative imaging with a C-arm demonstrates opacification of extrahepatic bile ducts with normal caliber and no evidence of filling defects. The cystic duct insertion is fairly low. Visualized intrahepatic ducts are unremarkable. Contrast enters the duodenum normally. No extravasation of contrast is identified.  IMPRESSION: Normal intraoperative cholangiogram.   Electronically Signed   By: Aletta Edouard M.D.   On: 03/16/2015 13:50    Medications / Allergies:  Scheduled Meds: . heparin subcutaneous  5,000  Units Subcutaneous 3 times per day   Continuous Infusions: . dextrose 5 % and 0.45 % NaCl with KCl 20 mEq/L 100  mL/hr at 03/17/15 0228   PRN Meds:.HYDROcodone-acetaminophen, morphine injection, ondansetron **OR** ondansetron (ZOFRAN) IV  Antibiotics: Anti-infectives    Start     Dose/Rate Route Frequency Ordered Stop   03/15/15 0900  cefTRIAXone (ROCEPHIN) 2 g in dextrose 5 % 50 mL IVPB - Premix  Status:  Discontinued    Comments:  Pharmacy may adjust dosing strength / duration / interval for maximal efficacy   2 g 100 mL/hr over 30 Minutes Intravenous Every 24 hours 03/15/15 0828 03/16/15 1535   03/12/15 1100  Ampicillin-Sulbactam (UNASYN) 3 g in sodium chloride 0.9 % 100 mL IVPB  Status:  Discontinued     3 g 100 mL/hr over 60 Minutes Intravenous 4 times per day 03/12/15 1024 03/15/15 0828        Assessment/Plan POD#1 laparoscopic cholecystectomy with IOC---Dr. Hassell Done -tolerating POs, passing flatus, VSS, afebrile.  Needs to mobilize and check his pulse ox.  I suspect he has some atelectasis and needs to use his IS.  If oxygen is okay, he may be discharged from surgical standpoint.  I have arranged his follow up and provided instructions.     Erby Pian, [facility] Pager [phone]([id number]:30P)   03/17/2015 8:56 AM

## 2015-03-17 NOTE — Progress Notes (Signed)
SATURATION QUALIFICATIONS: (This note is used to comply with regulatory documentation for home oxygen)  Patient Saturations on Room Air at Rest = 92%  Patient Saturations on Room Air while Ambulating = 88%  Virginia Rochester, RN

## 2015-03-28 ENCOUNTER — Ambulatory Visit (INDEPENDENT_AMBULATORY_CARE_PROVIDER_SITE_OTHER): Payer: BLUE CROSS/BLUE SHIELD | Admitting: Physician Assistant

## 2015-03-28 VITALS — BP 120/80 | HR 94 | Temp 97.4°F | Resp 17 | Ht 73.0 in | Wt 199.0 lb

## 2015-03-28 DIAGNOSIS — D72829 Elevated white blood cell count, unspecified: Secondary | ICD-10-CM

## 2015-03-28 DIAGNOSIS — Z09 Encounter for follow-up examination after completed treatment for conditions other than malignant neoplasm: Secondary | ICD-10-CM | POA: Diagnosis not present

## 2015-03-28 LAB — POCT CBC
GRANULOCYTE PERCENT: 83.7 % — AB (ref 37–80)
HCT, POC: 46.1 % (ref 43.5–53.7)
Hemoglobin: 15.6 g/dL (ref 14.1–18.1)
Lymph, poc: 1.7 (ref 0.6–3.4)
MCH: 32.8 pg — AB (ref 27–31.2)
MCHC: 33.8 g/dL (ref 31.8–35.4)
MCV: 97.2 fL — AB (ref 80–97)
MID (cbc): 0.8 (ref 0–0.9)
MPV: 7.6 fL (ref 0–99.8)
PLATELET COUNT, POC: 512 10*3/uL — AB (ref 142–424)
POC Granulocyte: 12.7 — AB (ref 2–6.9)
POC LYMPH %: 11 % (ref 10–50)
POC MID %: 5.3 % (ref 0–12)
RBC: 4.74 M/uL (ref 4.69–6.13)
RDW, POC: 12.1 %
WBC: 15.2 10*3/uL — AB (ref 4.6–10.2)

## 2015-03-28 LAB — POCT URINALYSIS DIPSTICK
BILIRUBIN UA: NEGATIVE
Blood, UA: NEGATIVE
Glucose, UA: NEGATIVE
KETONES UA: NEGATIVE
Leukocytes, UA: NEGATIVE
NITRITE UA: NEGATIVE
Protein, UA: NEGATIVE
Spec Grav, UA: 1.02
UROBILINOGEN UA: 0.2
pH, UA: 6.5

## 2015-03-28 LAB — COMPLETE METABOLIC PANEL WITH GFR
ALBUMIN: 3.4 g/dL — AB (ref 3.6–5.1)
ALK PHOS: 76 U/L (ref 40–115)
ALT: 32 U/L (ref 9–46)
AST: 25 U/L (ref 10–35)
BILIRUBIN TOTAL: 0.5 mg/dL (ref 0.2–1.2)
BUN: 16 mg/dL (ref 7–25)
CO2: 27 mEq/L (ref 20–31)
CREATININE: 0.6 mg/dL — AB (ref 0.70–1.25)
Calcium: 9.9 mg/dL (ref 8.6–10.3)
Chloride: 97 mEq/L — ABNORMAL LOW (ref 98–110)
GFR, Est African American: >89 mL/min (ref >=60)
GFR, Est Non African American: >89 mL/min (ref >=60)
Glucose, Bld: 124 mg/dL — ABNORMAL HIGH (ref 65–99)
Potassium: 4.6 mEq/L (ref 3.5–5.3)
SODIUM: 135 meq/L (ref 135–146)
Total Protein: 7.1 g/dL (ref 6.1–8.1)

## 2015-03-28 LAB — POCT UA - MICROSCOPIC ONLY
Casts, Ur, LPF, POC: NEGATIVE
Crystals, Ur, HPF, POC: NEGATIVE
MUCUS UA: POSITIVE
RBC, urine, microscopic: NEGATIVE
Yeast, UA: NEGATIVE

## 2015-03-28 LAB — AMYLASE: Amylase: 50 U/L (ref 0–105)

## 2015-03-28 LAB — LIPASE: LIPASE: 58 U/L (ref 0–75)

## 2015-03-28 NOTE — Progress Notes (Addendum)
03/28/2015 at 5:03 PM  Travis Ellis / DOB: [birth year] / MRN: [id number]  The patient has HTN (hypertension); Dyslipidemia; Neuropathy, leg; Abnormal gait; Prostate cancer; Pancreatitis; LFT elevation; Leukocytosis; Acute pancreatitis; and Ileus on his problem list.  SUBJECTIVE  Chief complaint: Follow-up  Travis Ellis is a frail appearing afebrile here for hospital follow up status post pancreatitis followed by laparoscopic cholecystectomy on 7/14. He reports eating and drinking well at this time and reports "he is feeling better every day."  Denies cough, chest pain, belly pain, diarrhea and rash today.  He has a follow up with his surgeon's office on 03/30/2015.      His discharge summary is as follows:  "Hospital Course:   Acute gallstone pancreatitis status post laparoscopic cholecystectomy on 7/14. He was seen by gastroenterology and by general surgery. Because his liver function tests trended down spontaneously, he underwent cholecystectomy instead of ERCP. His intraoperative cholangiogram demonstrated no evidence of obstructive stones. He did well postoperatively and was able to tolerate a regular diet prior to discharge.  Leukocytosis, related to acute pancreatitis and remained elevated. He should have a repeat CBC done as an outpatient by his primary care doctor in 1-2 weeks to confirm that his leukocytosis is resolving.  Transaminitis, liver function tests trended down to within normal limits. Recommend repeating in approximately 1-2 weeks.recommended that he continue to hold his simvastatin until he has repeat liver function test by his primary care doctor.  Essential hypertension,blood pressure was stable prior to discharge on lisinopril.  Hypokalemia, resolved with IV potassium repletion.   Consultants:  GI  General Surgery  Procedures:  Laparoscopic cholecystectomy on 7/14 with intraoperative cholangiogram  Antibiotics:  Rocephin 7/13-- 7/14"      He   has a past medical history of Cancer (2008); GERD (gastroesophageal reflux disease); Hypertension; Hyperlipidemia; Arthritis; Allergy; Neuromuscular disorder; and Prostate cancer.    Medications reviewed and updated by myself where necessary, and exist elsewhere in the encounter.   [name] has No Known Allergies. He  reports that he has never smoked. He has never used smokeless tobacco. He reports that he drinks alcohol. He reports that he does not use illicit drugs. He  reports that he currently engages in sexual activity. The patient  has past surgical history that includes Replacement total knee (2010); Prostatectomy (12/2006); Joint replacement (Right, 08/2009); and Cholecystectomy (N/A, 03/16/2015).  His family history includes Breast cancer in his mother; COPD in his father; Cancer in his mother; Heart disease in his father and paternal grandfather.  Review of Systems  Constitutional: Negative for fever and chills.  Respiratory: Negative for cough.   Cardiovascular: Negative for chest pain.  Gastrointestinal: Negative for nausea.  Genitourinary: Negative for dysuria.  Musculoskeletal: Negative for myalgias.  Skin: Negative for itching and rash.  Neurological: Negative for dizziness and headaches.  Psychiatric/Behavioral: Negative for depression.    OBJECTIVE  His  height is 6\' 1"  (1.854 m) and weight is 199 lb (90.266 kg). His oral temperature is 97.4 F (36.3 C). His blood pressure is 120/80 and his pulse is 108. His respiration is 17 and oxygen saturation is 96%.  The patient's body mass index is 26.26 kg/(m^2).  Physical Exam  Constitutional: He is oriented to person, place, and time. He appears well-developed and well-nourished. No distress.  Cardiovascular: Normal rate, regular rhythm, normal heart sounds and intact distal pulses.   Respiratory: Effort normal and breath sounds normal.  GI: Soft. Bowel sounds are normal. He exhibits no  distension and no mass. There is no  tenderness. There is no rebound and no guarding.  Musculoskeletal: Normal range of motion.  Neurological: He is alert and oriented to person, place, and time. No cranial nerve deficit. He exhibits normal muscle tone. Coordination normal.  Skin: Skin is warm and dry. He is not diaphoretic.  Psychiatric: He has a normal mood and affect.    Results for orders placed or performed in visit on 03/28/15 (from the past 24 hour(s))  Lipase     Status: None   Collection Time: 03/28/15 11:00 AM  Result Value Ref Range   Lipase 58 0 - 75 U/L   Narrative   Performed at:  [address]                [place], [place] [address]  Amylase     Status: None   Collection Time: 03/28/15 11:00 AM  Result Value Ref Range   Amylase 50 0 - 105 U/L   Narrative   Performed at:  [address]                [place], [place] [address]  COMPLETE METABOLIC PANEL WITH GFR     Status: Abnormal   Collection Time: 03/28/15 11:00 AM  Result Value Ref Range   Sodium 135 135 - 146 mEq/L   Potassium 4.6 3.5 - 5.3 mEq/L   Chloride 97 (L) 98 - 110 mEq/L   CO2 27 20 - 31 mEq/L   Glucose, Bld 124 (H) 65 - 99 mg/dL   BUN 16 7 - 25 mg/dL   Creat 0.60 (L) 0.70 - 1.25 mg/dL   Total Bilirubin 0.5 0.2 - 1.2 mg/dL   Alkaline Phosphatase 76 40 - 115 U/L   AST 25 10 - 35 U/L   ALT 32 9 - 46 U/L   Total Protein 7.1 6.1 - 8.1 g/dL   Albumin 3.4 (L) 3.6 - 5.1 g/dL   Calcium 9.9 8.6 - 10.3 mg/dL   GFR, Est African American >89 >=60 mL/min   GFR, Est Non African American >89 >=60 mL/min   Narrative   Performed at:  [address]                [place], [place] [address]  POCT urinalysis dipstick     Status: None   Collection Time: 03/28/15 11:29 AM  Result Value Ref Range   Color, UA yellow    Clarity, UA clear    Glucose, UA neg    Bilirubin, UA neg    Ketones, UA neg    Spec Grav,  UA 1.020    Blood, UA neg    pH, UA 6.5    Protein, UA neg    Urobilinogen, UA 0.2    Nitrite, UA neg    Leukocytes, UA Negative Negative  POCT UA - Microscopic Only     Status: None   Collection Time: 03/28/15 11:29 AM  Result Value Ref Range   WBC, Ur, HPF, POC 0-2    RBC, urine, microscopic neg    Bacteria, U Microscopic few    Mucus, UA positive    Epithelial  cells, urine per micros 0-1    Crystals, Ur, HPF, POC neg    Casts, Ur, LPF, POC neg    Yeast, UA neg   POCT CBC     Status: Abnormal   Collection Time: 03/28/15 11:29 AM  Result Value Ref Range   WBC 15.2 (A) 4.6 - 10.2 K/uL   Lymph, poc 1.7 0.6 - 3.4   POC LYMPH PERCENT 11.0 10 - 50 %L   MID (cbc) 0.8 0 - 0.9   POC MID % 5.3 0 - 12 %M   POC Granulocyte 12.7 (A) 2 - 6.9   Granulocyte percent 83.7 (A) 37 - 80 %G   RBC 4.74 4.69 - 6.13 M/uL   Hemoglobin 15.6 14.1 - 18.1 g/dL   HCT, POC 46.1 43.5 - 53.7 %   MCV 97.2 (A) 80 - 97 fL   MCH, POC 32.8 (A) 27 - 31.2 pg   MCHC 33.8 31.8 - 35.4 g/dL   RDW, POC 12.1 %   Platelet Count, POC 512 (A) 142 - 424 K/uL   MPV 7.6 0 - 99.8 fL     ASSESSMENT & PLAN  Celedonio was seen today for follow-up.  Diagnoses and all orders for this visit:  Hospital discharge follow-up Orders: -     POCT urinalysis dipstick -     POCT UA - Microscopic Only -     POCT CBC -     Lipase -     Amylase -     COMPLETE METABOLIC PANEL WITH GFR  Leukocytosis: Improving. Afebrile male with benign belly exam and no complaints 10 days post inpatient.  He does not complain of cough, SOB, diarrhea or rash. He is eating and drinking without difficulty. His urine, CMET, amylase, and lipase are within normal limits.  Will hold off or Abx therapy for now as the benefit of treating an unknown etiology does not outweigh the risk.  I will call this patient tomorrow to see how he is feeling.      The patient was advised to call or come back to clinic if he does not see an improvement in symptoms, or  worsens with the above plan.   Philis Fendt, MHS, PA-C [facility] 03/28/2015 5:03 PM

## 2015-04-04 ENCOUNTER — Encounter: Payer: Self-pay | Admitting: Physician Assistant

## 2015-04-11 ENCOUNTER — Encounter: Payer: Self-pay | Admitting: Physician Assistant

## 2015-04-11 ENCOUNTER — Ambulatory Visit (INDEPENDENT_AMBULATORY_CARE_PROVIDER_SITE_OTHER): Payer: BLUE CROSS/BLUE SHIELD | Admitting: Physician Assistant

## 2015-04-11 VITALS — BP 138/86 | HR 112 | Temp 98.2°F | Resp 16 | Ht 73.0 in | Wt 201.0 lb

## 2015-04-11 DIAGNOSIS — R000 Tachycardia, unspecified: Secondary | ICD-10-CM

## 2015-04-11 DIAGNOSIS — Z09 Encounter for follow-up examination after completed treatment for conditions other than malignant neoplasm: Secondary | ICD-10-CM | POA: Diagnosis not present

## 2015-04-11 NOTE — Progress Notes (Signed)
04/11/2015 at 3:46 PM  Travis Ellis / DOB: [birth year] / MRN: [id number]  The patient has HTN (hypertension); Dyslipidemia; Neuropathy, leg; Abnormal gait; Prostate cancer; Pancreatitis; LFT elevation; Leukocytosis; Acute pancreatitis; and Ileus on his problem list.  SUBJECTIVE  Travis Ellis is a 59 y.o. well appearing male presenting for the chief complaint of insurance paper work.  He has a form with him and would like this to attest that he should remain out of work.  He denies chest pain, SOB, diaphoresis, nausea, urinary symptoms and HA.  He is eating a drinking well and is working hard to get his strength back post gall bladder removal.   He reports no longer needing the walker to ambulate, and is now using a cane.  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   Patient reports he has had a very busy day with "lots of running around," and feels fatigued, as this is more physical activity than he is used to getting since his surgery.     He  has a past medical history of Cancer (2008); GERD (gastroesophageal reflux disease); Hypertension; Hyperlipidemia; Arthritis; Allergy; Neuromuscular disorder; and Prostate cancer.    Medications reviewed and updated by myself where necessary, and exist elsewhere in the encounter.   Travis Ellis has No Known Allergies. He  reports that he has never smoked. He has never used smokeless tobacco. He reports that he drinks alcohol. He reports that he does not use illicit drugs. He  reports that he currently engages in sexual activity. The patient  has past surgical history that includes Replacement total knee (2010); Prostatectomy (12/2006); Joint replacement (Right, 08/2009); and Cholecystectomy (N/A, 03/16/2015).  His family history includes Breast cancer in his mother; COPD in his father; Cancer in his mother; Heart disease in his father and paternal grandfather.  ROS  Per HPI other wise negative.   OBJECTIVE  His  height is 6\' 1"  (1.854 m)  and weight is 201 lb (91.173 kg). His oral temperature is 98.2 F (36.8 C). His blood pressure is 138/86 and his pulse is 112. His respiration is 16 and oxygen saturation is 98%.  The patient's body mass index is 26.52 kg/(m^2).  Physical Exam  Constitutional: He is oriented to person, place, and time. He appears well-developed and well-nourished. No distress.  HENT:  Head: Normocephalic.  Right Ear: External ear normal.  Left Ear: External ear normal.  Mouth/Throat: Oropharynx is clear and moist. No oropharyngeal exudate.  Eyes: EOM are normal. Pupils are equal, round, and reactive to light.  Cardiovascular: Regular rhythm and normal heart sounds.   No extrasystoles are present. Tachycardia present.   Pulses:      Radial pulses are 2+ on the right side, and 2+ on the left side.       Dorsalis pedis pulses are 2+ on the right side, and 2+ on the left side.  Respiratory: Effort normal and breath sounds normal. He has no wheezes. He has no rales.  GI: Soft. Bowel sounds are normal.  Musculoskeletal: Normal range of motion.  Neurological: He is alert and oriented to person, place, and time.  Skin: Skin is warm and dry. He is not diaphoretic. No pallor.  Psychiatric: He has a normal mood and affect.    No results found for this or any previous visit (from the past 24 hour(s)).  ASSESSMENT & PLAN  Travis Ellis was seen today for follow-up.  Diagnoses and all orders for this visit:  Follow up: Forms completed.    Tachycardia with 100 - 120 beats per minute: Patient asymptomatic today and has and his physical exam is reassuring. Patient amenable to monitoring this and will call [facility] in the morning with his heart rate and BP.    The patient was advised to call or come back to clinic if he does not see an improvement in symptoms, or worsens with the above plan.   Philis Fendt, MHS, PA-C [facility] 04/11/2015 3:46 PM

## 2015-04-12 ENCOUNTER — Telehealth: Payer: Self-pay

## 2015-04-12 NOTE — Telephone Encounter (Signed)
This is great.  Please call patient back and advise that they follow up as initially planned in early September.  Philis Fendt, MS, PA-C   10:28 AM, 04/12/2015

## 2015-04-12 NOTE — Telephone Encounter (Signed)
PATIENT'S WIFE (KAREN) SAID HER HUSBAND SAW MICHAEL CLARK ON Tuesday AND HE ASKED FOR THEM TO CALL BACK WITH MR. Stonerock BLOOD PRESSURE AND PULSE. THIS MORNING AT 9:03 IT WAS 133/79 AND HIS PULSE WAS 79.  BEST PHONE [phone] (WIFE IS KAREN Pensinger) PHARMACY CHOICE IS [facility] [address] [place]

## 2015-04-13 NOTE — Telephone Encounter (Signed)
Travis Ellis of message.

## 2015-04-18 NOTE — Progress Notes (Signed)
  Medical screening examination/treatment/procedure(s) were performed by non-physician practitioner and as supervising physician I was immediately available for consultation/collaboration.     

## 2015-04-19 ENCOUNTER — Telehealth: Payer: Self-pay

## 2015-04-19 NOTE — Telephone Encounter (Signed)
Please advise patient to start back on one half tab of Lisinopril/HCTZ for now and to monitor.  I would like to here from him in 1 week with an average bp and heart rate in one week.  Philis Fendt, MS, PA-C   9:10 PM, 04/19/2015 \

## 2015-04-19 NOTE — Telephone Encounter (Signed)
Pt is wanting to let michael clark know what bp today is 621 am 140/85 pulse 87 842 am 143/87 pulse 88  They want to know if he should go back on bp meds or not

## 2015-04-20 NOTE — Telephone Encounter (Signed)
Spoke with pt, advised message from Michael. Pt understood. 

## 2015-04-25 ENCOUNTER — Telehealth: Payer: Self-pay

## 2015-04-25 NOTE — Telephone Encounter (Signed)
Philis Fendt,   Wife called with seven day average of BP readings  133/85 And Pulse is an JRP 39  [phone]

## 2015-04-26 NOTE — Telephone Encounter (Signed)
Please call patient back and make him aware to maintain current dose and to continue to monitor and record daily.  If his BP is consistently over 140/90 then please let me know.  Philis Fendt, MS, PA-C   7:27 PM, 04/26/2015

## 2015-05-01 NOTE — Telephone Encounter (Signed)
Left detailed message on voicemail letting pt know.

## 2015-05-04 ENCOUNTER — Ambulatory Visit: Payer: BLUE CROSS/BLUE SHIELD | Admitting: Urgent Care

## 2015-05-10 ENCOUNTER — Ambulatory Visit (INDEPENDENT_AMBULATORY_CARE_PROVIDER_SITE_OTHER): Payer: BLUE CROSS/BLUE SHIELD | Admitting: Physician Assistant

## 2015-05-10 ENCOUNTER — Encounter: Payer: Self-pay | Admitting: Physician Assistant

## 2015-05-10 VITALS — BP 116/74 | HR 112 | Temp 97.7°F | Resp 18 | Ht 73.0 in | Wt 202.0 lb

## 2015-05-10 DIAGNOSIS — Z23 Encounter for immunization: Secondary | ICD-10-CM | POA: Diagnosis not present

## 2015-05-10 DIAGNOSIS — Z8719 Personal history of other diseases of the digestive system: Secondary | ICD-10-CM

## 2015-05-10 DIAGNOSIS — I10 Essential (primary) hypertension: Secondary | ICD-10-CM | POA: Diagnosis not present

## 2015-05-10 DIAGNOSIS — R5381 Other malaise: Secondary | ICD-10-CM

## 2015-05-10 NOTE — Progress Notes (Signed)
05/11/2015 at 9:10 AM  [name] / DOB: Dec 13, 1955 / MRN: [id number]  The patient has HTN (hypertension); Dyslipidemia; Neuropathy, leg; Abnormal gait; Prostate cancer; Pancreatitis; LFT elevation; Leukocytosis; Acute pancreatitis; and Ileus on his problem list.  SUBJECTIVE  Travis Ellis is a 59 y.o. well appearing male presenting for the chief complaint of follow up status post pancreatitis.   He continues to feel better but feels that he needs more time off from work in order to fully recover. He is walking for exercise and reports being able to ambulate 400 yards without taking a break.     He has been monitoring his BP and is now taking Prinzide 10-12.5.  His average BP over the last 7 days is 124/82 and his pulse is 82.  Immunization History  Administered Date(s) Administered  . Tdap 05/10/2015     He  has a past medical history of Cancer (2008); GERD (gastroesophageal reflux disease); Hypertension; Hyperlipidemia; Arthritis; Allergy; Neuromuscular disorder; and Prostate cancer.    Medications reviewed and updated by myself where necessary, and exist elsewhere in the encounter.   Travis Ellis has No Known Allergies. He  reports that he has never smoked. He has never used smokeless tobacco. He reports that he drinks alcohol. He reports that he does not use illicit drugs. He  reports that he currently engages in sexual activity. The patient  has past surgical history that includes Replacement total knee (2010); Prostatectomy (12/2006); Joint replacement (Right, 08/2009); and Cholecystectomy (N/A, 03/16/2015).  His family history includes Breast cancer in his mother; COPD in his father; Cancer in his mother; Heart disease in his father and paternal grandfather.  Review of Systems  Constitutional: Negative for fever and chills.  Respiratory: Negative for shortness of breath.   Cardiovascular: Negative for chest pain.  Gastrointestinal: Negative for nausea and abdominal pain.    Genitourinary: Negative.   Skin: Negative for rash.  Neurological: Negative for dizziness and headaches.    OBJECTIVE  His  height is 6\' 1"  (1.854 m) and weight is 202 lb (91.627 kg). His temperature is 97.7 F (36.5 C). His blood pressure is 116/74 and his pulse is 112. His respiration is 18.  The patient's body mass index is 26.66 kg/(m^2).  Physical Exam  Vitals reviewed. Constitutional: He is oriented to person, place, and time. He appears well-developed. No distress.  Eyes: EOM are normal. Pupils are equal, round, and reactive to light. No scleral icterus.  Neck: Normal range of motion.  Cardiovascular: Normal rate and regular rhythm.   Respiratory: Effort normal and breath sounds normal.  GI: He exhibits no distension.  Musculoskeletal: Normal range of motion.  Neurological: He is alert and oriented to person, place, and time. No cranial nerve deficit.  Skin: Skin is warm and dry. No rash noted. He is not diaphoretic.  Psychiatric: He has a normal mood and affect.    No results found for this or any previous visit (from the past 24 hour(s)).  ASSESSMENT & PLAN  [name] was seen today for follow-up and pancreatitis.  Diagnoses and all orders for this visit:  Essential hypertension -     CBC with Differential/Platelet -     COMPLETE METABOLIC PANEL WITH GFR -     POCT UA - Microscopic Only -     POCT urinalysis dipstick  History of pancreatitis -     Lipase -     Amylase  Need for Tdap vaccination -     Tdap  vaccine greater than or equal to 7yo IM  Physical deconditioning: Patient excused from work medically until 06/19/15. He is to follow up before that time to further determine the need for time off. Advised that he double his walking to 800 yards.      The patient was advised to call or come back to clinic if he does not see an improvement in symptoms, or worsens with the above plan.   Philis Fendt, MHS, PA-C [facility] 05/11/2015 9:10 AM

## 2015-05-11 LAB — CBC WITH DIFFERENTIAL/PLATELET

## 2015-05-11 LAB — AMYLASE: Amylase: 56 U/L (ref 0–105)

## 2015-05-11 LAB — COMPLETE METABOLIC PANEL WITH GFR
ALT: 31 U/L (ref 9–46)
AST: 26 U/L (ref 10–35)
Albumin: 4 g/dL (ref 3.6–5.1)
Alkaline Phosphatase: 62 U/L (ref 40–115)
BUN: 20 mg/dL (ref 7–25)
CALCIUM: 10 mg/dL (ref 8.6–10.3)
CHLORIDE: 97 mmol/L — AB (ref 98–110)
CO2: 24 mmol/L (ref 20–31)
CREATININE: 0.62 mg/dL — AB (ref 0.70–1.25)
GFR, Est African American: >89 mL/min (ref >=60)
GFR, Est Non African American: >89 mL/min (ref >=60)
GLUCOSE: 80 mg/dL (ref 65–99)
Potassium: 3.8 mmol/L (ref 3.5–5.3)
Sodium: 136 mmol/L (ref 135–146)
Total Bilirubin: 0.5 mg/dL (ref 0.2–1.2)
Total Protein: 6.9 g/dL (ref 6.1–8.1)

## 2015-05-11 LAB — LIPASE: Lipase: 35 U/L (ref 7–60)

## 2015-06-06 ENCOUNTER — Other Ambulatory Visit: Payer: Self-pay | Admitting: Orthopedic Surgery

## 2015-06-14 ENCOUNTER — Encounter: Payer: Self-pay | Admitting: Physician Assistant

## 2015-06-14 ENCOUNTER — Ambulatory Visit (INDEPENDENT_AMBULATORY_CARE_PROVIDER_SITE_OTHER): Payer: BLUE CROSS/BLUE SHIELD | Admitting: Physician Assistant

## 2015-06-14 VITALS — BP 123/77 | HR 99 | Temp 98.0°F | Resp 18 | Ht 73.0 in | Wt 207.0 lb

## 2015-06-14 DIAGNOSIS — Z01818 Encounter for other preprocedural examination: Secondary | ICD-10-CM

## 2015-06-14 DIAGNOSIS — Z7189 Other specified counseling: Secondary | ICD-10-CM | POA: Diagnosis not present

## 2015-06-14 DIAGNOSIS — Z0000 Encounter for general adult medical examination without abnormal findings: Secondary | ICD-10-CM

## 2015-06-14 LAB — COMPLETE METABOLIC PANEL WITH GFR
ALT: 27 U/L (ref 9–46)
AST: 22 U/L (ref 10–35)
Albumin: 4.2 g/dL (ref 3.6–5.1)
Alkaline Phosphatase: 54 U/L (ref 40–115)
BILIRUBIN TOTAL: 0.6 mg/dL (ref 0.2–1.2)
BUN: 18 mg/dL (ref 7–25)
CALCIUM: 9.7 mg/dL (ref 8.6–10.3)
CHLORIDE: 101 mmol/L (ref 98–110)
CO2: 27 mmol/L (ref 20–31)
CREATININE: 1.07 mg/dL (ref 0.70–1.25)
GFR, EST AFRICAN AMERICAN: 86 mL/min (ref >=60)
GFR, Est Non African American: 75 mL/min (ref >=60)
Glucose, Bld: 134 mg/dL — ABNORMAL HIGH (ref 65–99)
Potassium: 3.8 mmol/L (ref 3.5–5.3)
Sodium: 138 mmol/L (ref 135–146)
TOTAL PROTEIN: 6.6 g/dL (ref 6.1–8.1)

## 2015-06-14 LAB — CBC WITH DIFFERENTIAL/PLATELET
BASOS ABS: 0 10*3/uL (ref 0.0–0.1)
Basophils Relative: 0 % (ref 0–1)
EOS ABS: 0.1 10*3/uL (ref 0.0–0.7)
Eosinophils Relative: 1 % (ref 0–5)
HCT: 47.5 % (ref 39.0–52.0)
HEMOGLOBIN: 16.3 g/dL (ref 13.0–17.0)
LYMPHS ABS: 1 10*3/uL (ref 0.7–4.0)
Lymphocytes Relative: 14 % (ref 12–46)
MCH: 32.8 pg (ref 26.0–34.0)
MCHC: 34.3 g/dL (ref 30.0–36.0)
MCV: 95.6 fL (ref 78.0–100.0)
MONOS PCT: 10 % (ref 3–12)
MPV: 10.5 fL (ref 8.6–12.4)
Monocytes Absolute: 0.7 10*3/uL (ref 0.1–1.0)
NEUTROS ABS: 5.2 10*3/uL (ref 1.7–7.7)
NEUTROS PCT: 75 % (ref 43–77)
PLATELETS: 265 10*3/uL (ref 150–400)
RBC: 4.97 MIL/uL (ref 4.22–5.81)
RDW: 12.8 % (ref 11.5–15.5)
WBC: 6.9 10*3/uL (ref 4.0–10.5)

## 2015-06-14 LAB — LIPID PANEL
Cholesterol: 172 mg/dL (ref 125–200)
HDL: 28 mg/dL — ABNORMAL LOW (ref >=40)
LDL CALC: 88 mg/dL (ref <130)
Total CHOL/HDL Ratio: 6.1 Ratio — ABNORMAL HIGH (ref <=5.0)
Triglycerides: 279 mg/dL — ABNORMAL HIGH (ref <150)
VLDL: 56 mg/dL — AB (ref <30)

## 2015-06-14 LAB — POCT GLYCOSYLATED HEMOGLOBIN (HGB A1C): HEMOGLOBIN A1C: 5.5

## 2015-06-14 NOTE — Progress Notes (Addendum)
06/14/2015 at 3:59 PM  Travis Ellis / DOB: June 05, 1956 / MRN: [id number]  The patient has HTN (hypertension); Neuropathy, leg; Abnormal gait; Prostate cancer ([place]); and Acute pancreatitis on his problem list.  SUBJECTIVE  Travis Ellis is a 59 y.o. male who presents for continuation of medical leave s/p cholecystectomy 2/2 gall stones this most recent summer. Today he has documentation from [facility] along with MRI results indicating that he is in need of left rotator cuff repair which will take place on 06/21/15.  He has no complaints today. He has a form with him that he would like completed allowing him to stay out of work.   He  has a past medical history of Cancer ([place]) (2008); GERD (gastroesophageal reflux disease); Hypertension; Hyperlipidemia; Arthritis; Allergy; Neuromuscular disorder ([place]); and Prostate cancer ([place]).    Immunization History  Administered Date(s) Administered  . Tdap 05/10/2015     Medications reviewed and updated by myself where necessary, and exist elsewhere in the encounter.   Travis Ellis has No Known Allergies. He  reports that he has never smoked. He has never used smokeless tobacco. He reports that he drinks alcohol. He reports that he does not use illicit drugs. He  reports that he currently engages in sexual activity. The patient  has past surgical history that includes Replacement total knee (2010); Prostatectomy (12/2006); Joint replacement (Right, 08/2009); and Cholecystectomy (N/A, 03/16/2015).  His family history includes Breast cancer in his mother; COPD in his father; Cancer in his mother; Heart disease in his father and paternal grandfather.  Review of Systems  Constitutional: Negative.   Gastrointestinal: Negative.   Genitourinary: Negative.   Musculoskeletal: Positive for joint pain. Negative for falls.  Skin: Negative.   Neurological: Negative for headaches.    OBJECTIVE  His  height is 6\' 1"  (1.854 m) and weight is 207 lb  (93.895 kg). His temperature is 98 F (36.7 C). His blood pressure is 123/77 and his pulse is 99. His respiration is 18.  The patient's body mass index is 27.32 kg/(m^2).  Physical Exam  Vitals reviewed. Constitutional: He is oriented to person, place, and time. He appears well-developed and well-nourished. No distress.  Eyes: EOM are normal. Pupils are equal, round, and reactive to light. No scleral icterus.  Neck: Normal range of motion.  Cardiovascular: Normal rate and regular rhythm.   Respiratory: Effort normal and breath sounds normal.  GI: Soft. He exhibits no distension and no mass. There is no tenderness. There is no rebound and no guarding.  Musculoskeletal: Normal range of motion.  Neurological: He is alert and oriented to person, place, and time. No cranial nerve deficit.  Skin: Skin is warm and dry. No rash noted. He is not diaphoretic.  Psychiatric: He has a normal mood and affect.    Results for orders placed or performed in visit on 06/14/15 (from the past 24 hour(s))  POCT glycosylated hemoglobin (Hb A1C)     Status: None   Collection Time: 06/14/15  2:23 PM  Result Value Ref Range   Hemoglobin A1C 5.5     ASSESSMENT & PLAN  Travis Ellis was seen today for follow-up and pancreatitis.  Diagnoses and all orders for this visit:  Sur: Patient with difficult recovery from cholecystectomy.  Now to go to surgery on 10/19 for rotator cuff repair.  Advise that he remain out of work, which is physically demanding, until January 2017 to allow enough time for recovery for shoulder repair.  Documentation provided to  patient attesting to medical necessity.     Routine health maintenance:  Orders Placed This Encounter  Procedures  . CBC with Differential/Platelet  . TSH  . COMPLETE METABOLIC PANEL WITH GFR  . Lipid panel    Order Specific Question:  Has the patient fasted?    Answer:  No  . POCT glycosylated hemoglobin (Hb A1C)  . EKG 12-Lead           The patient was  advised to call or come back to clinic if he does not see an improvement in symptoms, or worsens with the above plan.   Philis Fendt, MHS, PA-C [facility] 06/14/2015 3:59 PM

## 2015-06-15 ENCOUNTER — Encounter (HOSPITAL_BASED_OUTPATIENT_CLINIC_OR_DEPARTMENT_OTHER): Payer: Self-pay | Admitting: *Deleted

## 2015-06-15 LAB — TSH: TSH: 1.017 u[IU]/mL (ref 0.350–4.500)

## 2015-06-15 NOTE — Progress Notes (Signed)
Dr. Smith Robert reviewed EKG and pt's history- [place] for surgery

## 2015-06-15 NOTE — Progress Notes (Signed)
Bring all medications. Pt had EKG and BMET done at  [facility] 06/14/2015 in computer.

## 2015-06-21 ENCOUNTER — Ambulatory Visit (HOSPITAL_BASED_OUTPATIENT_CLINIC_OR_DEPARTMENT_OTHER): Payer: BLUE CROSS/BLUE SHIELD | Admitting: Anesthesiology

## 2015-06-21 ENCOUNTER — Encounter (HOSPITAL_BASED_OUTPATIENT_CLINIC_OR_DEPARTMENT_OTHER)
Admission: RE | Disposition: A | Discharge status: Home / Self Care | Payer: Self-pay | Source: Ambulatory Visit | Attending: Orthopedic Surgery

## 2015-06-21 ENCOUNTER — Encounter (HOSPITAL_BASED_OUTPATIENT_CLINIC_OR_DEPARTMENT_OTHER): Payer: Self-pay | Admitting: Anesthesiology

## 2015-06-21 ENCOUNTER — Ambulatory Visit (HOSPITAL_BASED_OUTPATIENT_CLINIC_OR_DEPARTMENT_OTHER)
Admission: RE | Admit: 2015-06-21 | Discharge: 2015-06-21 | Disposition: A | Discharge status: Home / Self Care | Payer: BLUE CROSS/BLUE SHIELD | Source: Ambulatory Visit | Attending: Orthopedic Surgery | Admitting: Orthopedic Surgery

## 2015-06-21 ENCOUNTER — Telehealth: Payer: Self-pay

## 2015-06-21 DIAGNOSIS — Z96651 Presence of right artificial knee joint: Secondary | ICD-10-CM | POA: Diagnosis not present

## 2015-06-21 DIAGNOSIS — Z87891 Personal history of nicotine dependence: Secondary | ICD-10-CM | POA: Insufficient documentation

## 2015-06-21 DIAGNOSIS — Z8546 Personal history of malignant neoplasm of prostate: Secondary | ICD-10-CM | POA: Insufficient documentation

## 2015-06-21 DIAGNOSIS — E785 Hyperlipidemia, unspecified: Secondary | ICD-10-CM | POA: Insufficient documentation

## 2015-06-21 DIAGNOSIS — I10 Essential (primary) hypertension: Secondary | ICD-10-CM | POA: Insufficient documentation

## 2015-06-21 DIAGNOSIS — M75102 Unspecified rotator cuff tear or rupture of left shoulder, not specified as traumatic: Secondary | ICD-10-CM | POA: Diagnosis not present

## 2015-06-21 DIAGNOSIS — M7542 Impingement syndrome of left shoulder: Secondary | ICD-10-CM | POA: Diagnosis not present

## 2015-06-21 HISTORY — PX: ROTATOR CUFF REPAIR: SHX139

## 2015-06-21 SURGERY — SHOULDER ARTHROSCOPY WITH SUBACROMIAL DECOMPRESSION AND DISTAL CLAVICLE EXCISION
Anesthesia: General | Site: Shoulder | Laterality: Left

## 2015-06-21 MED ORDER — HYDROMORPHONE HCL 1 MG/ML IJ SOLN: 0.2500 mg | INTRAMUSCULAR | Status: DC | PRN

## 2015-06-21 MED ORDER — MIDAZOLAM HCL 2 MG/2ML IJ SOLN
1.0000 mg | INTRAMUSCULAR | Status: DC | PRN
  Administered 2015-06-21: 2 mg via INTRAVENOUS

## 2015-06-21 MED ORDER — LIDOCAINE HCL (CARDIAC) 20 MG/ML IV SOLN
INTRAVENOUS | Status: AC
  Filled 2015-06-21: qty 5

## 2015-06-21 MED ORDER — FENTANYL CITRATE (PF) 100 MCG/2ML IJ SOLN
INTRAMUSCULAR | Status: AC
  Filled 2015-06-21: qty 2

## 2015-06-21 MED ORDER — DEXAMETHASONE SODIUM PHOSPHATE 10 MG/ML IJ SOLN
INTRAMUSCULAR | Status: AC
  Filled 2015-06-21: qty 1

## 2015-06-21 MED ORDER — MEPERIDINE HCL 25 MG/ML IJ SOLN: 6.2500 mg | INTRAMUSCULAR | Status: DC | PRN

## 2015-06-21 MED ORDER — PROPOFOL 500 MG/50ML IV EMUL
INTRAVENOUS | Status: AC
  Filled 2015-06-21: qty 50

## 2015-06-21 MED ORDER — GLYCOPYRROLATE 0.2 MG/ML IJ SOLN: 0.2000 mg | Freq: Once | INTRAMUSCULAR | Status: DC | PRN

## 2015-06-21 MED ORDER — ONDANSETRON HCL 4 MG/2ML IJ SOLN: 4.0000 mg | Freq: Once | INTRAMUSCULAR | Status: DC | PRN

## 2015-06-21 MED ORDER — SCOPOLAMINE 1 MG/3DAYS TD PT72: 1.0000 | MEDICATED_PATCH | Freq: Once | TRANSDERMAL | Status: DC | PRN

## 2015-06-21 MED ORDER — BUPIVACAINE HCL (PF) 0.5 % IJ SOLN
INTRAMUSCULAR | Status: AC
  Filled 2015-06-21: qty 30

## 2015-06-21 MED ORDER — BUPIVACAINE-EPINEPHRINE (PF) 0.5% -1:200000 IJ SOLN
INTRAMUSCULAR | Status: DC | PRN
  Administered 2015-06-21: 30 mL via PERINEURAL

## 2015-06-21 MED ORDER — LACTATED RINGERS IV SOLN
INTRAVENOUS | Status: DC
  Administered 2015-06-21 (×2): via INTRAVENOUS

## 2015-06-21 MED ORDER — CEFAZOLIN SODIUM-DEXTROSE 2-3 GM-% IV SOLR
INTRAVENOUS | Status: AC
  Filled 2015-06-21: qty 50

## 2015-06-21 MED ORDER — MIDAZOLAM HCL 2 MG/2ML IJ SOLN
INTRAMUSCULAR | Status: AC
  Filled 2015-06-21: qty 2

## 2015-06-21 MED ORDER — CEFAZOLIN SODIUM-DEXTROSE 2-3 GM-% IV SOLR
2.0000 g | INTRAVENOUS | Status: AC
  Administered 2015-06-21: 2 g via INTRAVENOUS

## 2015-06-21 MED ORDER — BUPIVACAINE-EPINEPHRINE (PF) 0.5% -1:200000 IJ SOLN
INTRAMUSCULAR | Status: AC
Start: 2015-06-21 — End: 2015-06-21
  Filled 2015-06-21: qty 30

## 2015-06-21 MED ORDER — DEXAMETHASONE SODIUM PHOSPHATE 4 MG/ML IJ SOLN
INTRAMUSCULAR | Status: DC | PRN
  Administered 2015-06-21: 10 mg via INTRAVENOUS

## 2015-06-21 MED ORDER — FENTANYL CITRATE (PF) 100 MCG/2ML IJ SOLN
50.0000 ug | INTRAMUSCULAR | Status: DC | PRN
  Administered 2015-06-21: 100 ug via INTRAVENOUS

## 2015-06-21 MED ORDER — LIDOCAINE HCL (CARDIAC) 20 MG/ML IV SOLN
INTRAVENOUS | Status: DC | PRN
  Administered 2015-06-21: 100 mg via INTRAVENOUS

## 2015-06-21 MED ORDER — ONDANSETRON HCL 4 MG/2ML IJ SOLN
INTRAMUSCULAR | Status: AC
  Filled 2015-06-21: qty 2

## 2015-06-21 MED ORDER — PROPOFOL 10 MG/ML IV BOLUS
INTRAVENOUS | Status: DC | PRN
  Administered 2015-06-21: 200 mg via INTRAVENOUS

## 2015-06-21 MED ORDER — ONDANSETRON HCL 4 MG/2ML IJ SOLN
INTRAMUSCULAR | Status: DC | PRN
  Administered 2015-06-21: 4 mg via INTRAVENOUS

## 2015-06-21 MED ORDER — CHLORHEXIDINE GLUCONATE 4 % EX LIQD: 60.0000 mL | Freq: Once | CUTANEOUS | Status: DC

## 2015-06-21 MED ORDER — SUCCINYLCHOLINE CHLORIDE 20 MG/ML IJ SOLN
INTRAMUSCULAR | Status: AC
  Filled 2015-06-21: qty 1

## 2015-06-21 MED ORDER — EPHEDRINE SULFATE 50 MG/ML IJ SOLN
INTRAMUSCULAR | Status: DC | PRN
  Administered 2015-06-21 (×2): 10 mg via INTRAVENOUS

## 2015-06-21 MED ORDER — EPINEPHRINE HCL 1 MG/ML IJ SOLN
INTRAMUSCULAR | Status: AC
  Filled 2015-06-21: qty 1

## 2015-06-21 MED ORDER — ARTIFICIAL TEARS OP OINT
TOPICAL_OINTMENT | OPHTHALMIC | Status: AC
  Filled 2015-06-21: qty 3.5

## 2015-06-21 MED ORDER — SODIUM CHLORIDE 0.9 % IR SOLN
Status: DC | PRN
  Administered 2015-06-21: 9000 mL

## 2015-06-21 MED ORDER — SUCCINYLCHOLINE CHLORIDE 20 MG/ML IJ SOLN
INTRAMUSCULAR | Status: DC | PRN
  Administered 2015-06-21: 100 mg via INTRAVENOUS

## 2015-06-21 MED ORDER — OXYCODONE-ACETAMINOPHEN 5-325 MG PO TABS: 1.0000 | ORAL_TABLET | Freq: Four times a day (QID) | ORAL | Status: DC | PRN

## 2015-06-21 SURGICAL SUPPLY — 84 items
APL SKNCLS STERI-STRIP NONHPOA (GAUZE/BANDAGES/DRESSINGS)
BENZOIN TINCTURE PRP APPL 2/3 (GAUZE/BANDAGES/DRESSINGS) IMPLANT
BLADE 4.2CUDA (BLADE) IMPLANT
BLADE CLIPPER SURG (BLADE) ×3 IMPLANT
BLADE SURG 15 STRL LF DISP TIS (BLADE) IMPLANT
BLADE SURG 15 STRL SS (BLADE)
BLADE VORTEX 6.0 (BLADE) ×4 IMPLANT
CANNULA 5.75X71 LONG (CANNULA) IMPLANT
CANNULA TWIST IN 8.25X7CM (CANNULA) IMPLANT
CLOSURE WOUND 1/2 X4 (GAUZE/BANDAGES/DRESSINGS)
CUTTER MENISCUS  4.2MM (BLADE) ×2
CUTTER MENISCUS 4.2MM (BLADE) ×2 IMPLANT
DECANTER SPIKE VIAL GLASS SM (MISCELLANEOUS) IMPLANT
DRAPE INCISE IOBAN 66X45 STRL (DRAPES) ×4 IMPLANT
DRAPE STERI 35X30 U-POUCH (DRAPES) ×4 IMPLANT
DRAPE SURG 17X23 STRL (DRAPES) ×4 IMPLANT
DRAPE U-SHAPE 47X51 STRL (DRAPES) ×4 IMPLANT
DRAPE U-SHAPE 76X120 STRL (DRAPES) ×8 IMPLANT
DRSG EMULSION OIL 3X3 NADH (GAUZE/BANDAGES/DRESSINGS) ×4 IMPLANT
DRSG PAD ABDOMINAL 8X10 ST (GAUZE/BANDAGES/DRESSINGS) ×8 IMPLANT
DURAPREP 26ML APPLICATOR (WOUND CARE) ×4 IMPLANT
ELECT REM PT RETURN 9FT ADLT (ELECTROSURGICAL) ×4
ELECTRODE REM PT RTRN 9FT ADLT (ELECTROSURGICAL) ×2 IMPLANT
GAUZE SPONGE 4X4 12PLY STRL (GAUZE/BANDAGES/DRESSINGS) ×4 IMPLANT
GLOVE BIOGEL PI IND STRL 7.0 (GLOVE) ×2 IMPLANT
GLOVE BIOGEL PI IND STRL 8 (GLOVE) ×4 IMPLANT
GLOVE BIOGEL PI INDICATOR 7.0 (GLOVE) ×4
GLOVE BIOGEL PI INDICATOR 8 (GLOVE) ×4
GLOVE ECLIPSE 6.5 STRL STRAW (GLOVE) ×3 IMPLANT
GLOVE ECLIPSE 7.5 STRL STRAW (GLOVE) ×8 IMPLANT
GOWN STRL REUS W/ TWL LRG LVL3 (GOWN DISPOSABLE) ×2 IMPLANT
GOWN STRL REUS W/ TWL XL LVL3 (GOWN DISPOSABLE) ×2 IMPLANT
GOWN STRL REUS W/TWL LRG LVL3 (GOWN DISPOSABLE) ×4
GOWN STRL REUS W/TWL XL LVL3 (GOWN DISPOSABLE) ×8 IMPLANT
IV NS IRRIG 3000ML ARTHROMATIC (IV SOLUTION) ×8 IMPLANT
MANIFOLD NEPTUNE II (INSTRUMENTS) ×4 IMPLANT
NDL 1/2 CIR CATGUT .05X1.09 (NEEDLE) IMPLANT
NDL HYPO 18GX1.5 BLUNT FILL (NEEDLE) ×1 IMPLANT
NDL SCORPION MULTI FIRE (NEEDLE) IMPLANT
NDL SUT 6 .5 CRC .975X.05 MAYO (NEEDLE) IMPLANT
NEEDLE 1/2 CIR CATGUT .05X1.09 (NEEDLE) IMPLANT
NEEDLE HYPO 18GX1.5 BLUNT FILL (NEEDLE) ×4 IMPLANT
NEEDLE MAYO TAPER (NEEDLE)
NEEDLE SCORPION MULTI FIRE (NEEDLE) IMPLANT
NS IRRIG 1000ML POUR BTL (IV SOLUTION) IMPLANT
PACK ARTHROSCOPY DSU (CUSTOM PROCEDURE TRAY) ×4 IMPLANT
PACK BASIN DAY SURGERY FS (CUSTOM PROCEDURE TRAY) ×4 IMPLANT
PASSER SUT SWANSON 36MM LOOP (INSTRUMENTS) IMPLANT
PENCIL BUTTON HOLSTER BLD 10FT (ELECTRODE) IMPLANT
SET IRRIG Y TYPE TUR BLADDER L (SET/KITS/TRAYS/PACK) ×4 IMPLANT
SLEEVE SCD COMPRESS KNEE MED (MISCELLANEOUS) ×4 IMPLANT
SLING ARM FOAM STRAP LRG (SOFTGOODS) ×3 IMPLANT
SLING ARM IMMOBILIZER LRG (SOFTGOODS) IMPLANT
SLING ARM LRG ADULT FOAM STRAP (SOFTGOODS) IMPLANT
SLING ARM MED ADULT FOAM STRAP (SOFTGOODS) IMPLANT
SLING ARM XL FOAM STRAP (SOFTGOODS) IMPLANT
SPONGE LAP 4X18 X RAY DECT (DISPOSABLE) IMPLANT
STRIP CLOSURE SKIN 1/2X4 (GAUZE/BANDAGES/DRESSINGS) IMPLANT
SUCTION FRAZIER TIP 10 FR DISP (SUCTIONS) IMPLANT
SUT ETHIBOND 2 OS 4 DA (SUTURE) IMPLANT
SUT ETHILON 4 0 PS 2 18 (SUTURE) ×3 IMPLANT
SUT FIBERWIRE #2 38 T-5 BLUE (SUTURE)
SUT MNCRL AB 3-0 PS2 18 (SUTURE) IMPLANT
SUT PDS AB 0 CT 36 (SUTURE) IMPLANT
SUT PDS AB 1 CT  36 (SUTURE)
SUT PDS AB 1 CT 36 (SUTURE) IMPLANT
SUT TICRON 1 T 12 (SUTURE) IMPLANT
SUT TIGER TAPE 7 IN WHITE (SUTURE) IMPLANT
SUT VIC AB 0 CT1 27 (SUTURE)
SUT VIC AB 0 CT1 27XBRD ANBCTR (SUTURE) IMPLANT
SUT VIC AB 1 CT1 27 (SUTURE)
SUT VIC AB 1 CT1 27XBRD ANBCTR (SUTURE) IMPLANT
SUT VIC AB 2-0 SH 27 (SUTURE)
SUT VIC AB 2-0 SH 27XBRD (SUTURE) IMPLANT
SUTURE FIBERWR #2 38 T-5 BLUE (SUTURE) IMPLANT
SYR 5ML LL (SYRINGE) ×4 IMPLANT
TAPE FIBER 2MM 7IN #2 BLUE (SUTURE) IMPLANT
TOWEL OR 17X24 6PK STRL BLUE (TOWEL DISPOSABLE) ×4 IMPLANT
TOWEL OR NON WOVEN STRL DISP B (DISPOSABLE) ×4 IMPLANT
TUBE CONNECTING 20'X1/4 (TUBING) ×1
TUBE CONNECTING 20X1/4 (TUBING) ×2 IMPLANT
WAND STAR VAC 90 (SURGICAL WAND) ×4 IMPLANT
WATER STERILE IRR 1000ML POUR (IV SOLUTION) ×4 IMPLANT
YANKAUER SUCT BULB TIP NO VENT (SUCTIONS) IMPLANT

## 2015-06-21 NOTE — Progress Notes (Signed)
Assisted Dr. Ossey with left, ultrasound guided, supraclavicular block. Side rails up, monitors on throughout procedure. See vital signs in flow sheet. Tolerated Procedure well. 

## 2015-06-21 NOTE — Telephone Encounter (Signed)
Pt calling about labs. Please review.  

## 2015-06-21 NOTE — Anesthesia Postprocedure Evaluation (Signed)
  Anesthesia Post-op Note  Patient: Travis Ellis  Procedure(s) Performed: Procedure(s) (LRB): SHOULDER ARTHROSCOPY WITH SUBACROMIAL DECOMPRESSION AND DISTAL CLAVICLE RESECTION (Left)  Patient Location: PACU  Anesthesia Type: General  Level of Consciousness: awake and alert   Airway and Oxygen Therapy: Patient Spontanous Breathing  Post-op Pain: mild  Post-op Assessment: Post-op Vital signs reviewed, Patient's Cardiovascular Status Stable, Respiratory Function Stable, Patent Airway and No signs of Nausea or vomiting  Last Vitals:  Filed Vitals:   06/21/15 1100  BP: 108/65  Pulse: 81  Temp:   Resp: 14    Post-op Vital Signs: stable   Complications: No apparent anesthesia complications

## 2015-06-21 NOTE — Op Note (Signed)
NAME:  BALDEMAR, DADY NO.:  [id number]  MEDICAL RECORD NO.:  [id number]  LOCATION:                               FACILITY:  [facility]  PHYSICIAN:  Alta Corning, M.D.   DATE OF BIRTH:  Dec 02, 1955  DATE OF PROCEDURE:  06/21/2015 DATE OF DISCHARGE:  06/21/2015                              OPERATIVE REPORT   PREOPERATIVE DIAGNOSES:  Impingement AC joint arthritis, chronic retracted rotator cuff tear and questionable biceps tendon pain.  POSTOPERATIVE DIAGNOSES: 1. Impingement. 2. AC joint arthritis. 3. Impending biceps tendon rupture. 4. Anterior labral tear and glenoid fray.  PROCEDURE: 1. Arthroscopic subacromial decompression from lateral and posterior     compartment. 2. Arthroscopic distal clavicle resection over 20 mm from an anterior     compartment. 3. Biceps tenotomy. 4. Debridement of anterior labral fray as well as inferior glenoid     fray and subtotal bursectomy.  SURGEON:  Alta Corning, M.D.  ASSISTANT:  Gary Fleet, PA.  ANESTHESIA:  General.  BRIEF HISTORY:  Mr. Foushee is a 59 year old male with long extensive complaints of left shoulder pain.  He had been treated conservatively for a period of time.  After failure of all conservative care, he was taken to the operating room for left shoulder arthroscopy.  We known him to have a chronic and retracted rotator cuff tear.  We knew this before surgery.  We talked about treatment options.  We felt that the only reasonable course of action in that situation was a debridement unless for some reason, MRI was incorrect and it was a repairable cuff.  He was taken to the operating room with this understanding.  DESCRIPTION OF PROCEDURE:  The patient was taken to the operating room. After an adequate level of anesthesia had been obtained with general anesthetic, the patient was placed supine on the operating table.  Left arm was prepped and draped in usual sterile fashion.  After he was placed  in a beach chair position, all bony prominences were well padded. Routine arthroscopic examination at that point revealed there was an obvious and significant anterior labral tear, which was debrided.  The biceps tendon actually looked pretty good, but it had fallen down way anteriorly.  At that point, I was concerned that it could be a source of pain.  We released the biceps tendon by way of biceps tenotomy. Following this, attention was turned to the labrum and it was debrided back to an edge.  Inferiorly, the glenoid had significant grade 2, grade 3 and some early grade 4 wear, this was debrided back to a smooth and stable rim.  Attention was then turned to the rotator cuff, which was torn and retracted almost to the glenoid margin.  We put a grasper on it and tried to retract it laterally, really had minimal mobilization laterally.  At that point, the edges were debrided.  Attention was then turned towards the subacromial space, where the ArthroCare wand was used to debride all the tissue and then the large dramatic type 3 acromion was then burred from lateral to medial direction.  We then reversed the camera, put the burr in the back and  burred it from a medial to lateral direction at that point.  Once this was burr had  been taken down, the distal clavicle was then easily identifiable and the distal clavicle resected over 20 mm motorized bur.  Subtotal bursectomy was then performed and then the shoulder was copiously and thoroughly irrigated and suctioned dry.  The biceps tendon had been released after debriding the labral fray, as we felt that was the source of pain as well for the patient.  At this point, the shoulder was copiously irrigated and suctioned dry.  Arthroscopic portals were closed with bandage.  Sterile compressive dressing was applied and the patient was taken to recovery room and noted to be in satisfactory condition.  A 20 mL of 0.25% Marcaine had been instilled  around the portals and in the shoulder for postoperative anesthesia.     Alta Corning, M.D.     Corliss Skains  D:  06/21/2015  T:  06/21/2015  Job:  [id number]

## 2015-06-21 NOTE — Anesthesia Preprocedure Evaluation (Signed)
Anesthesia Evaluation  Patient identified by MRN, date of birth, ID band  Reviewed: Allergy & Precautions, NPO status , Patient's Chart, lab work & pertinent test results  Airway Mallampati: I  TM Distance: >3 FB Neck ROM: Full    Dental   Pulmonary former smoker,    Pulmonary exam normal        Cardiovascular hypertension, Normal cardiovascular exam     Neuro/Psych    GI/Hepatic GERD  Medicated and Controlled,  Endo/Other    Renal/GU      Musculoskeletal   Abdominal   Peds  Hematology   Anesthesia Other Findings   Reproductive/Obstetrics                             Anesthesia Physical Anesthesia Plan  ASA: II  Anesthesia Plan: General   Post-op Pain Management: MAC Combined w/ Regional for Post-op pain   Induction: Intravenous  Airway Management Planned: Oral ETT  Additional Equipment:   Intra-op Plan:   Post-operative Plan: Extubation in OR  Informed Consent: I have reviewed the patients History and Physical, chart, labs and discussed the procedure including the risks, benefits and alternatives for the proposed anesthesia with the patient or authorized representative who has indicated his/her understanding and acceptance.     Plan Discussed with: Surgeon and CRNA  Anesthesia Plan Comments:         Anesthesia Quick Evaluation

## 2015-06-21 NOTE — Discharge Instructions (Signed)
Discharge Instructions after Arthroscopic Shoulder Surgery ° ° °A sling has been provided for you. You may remove the sling after 72 hours. The sling may be worn for your protection, if you are in a crowd.  °Use ice on the shoulder intermittently over the first 48 hours after surgery.  °Pain medication has been prescribed for you.  °Use your medication liberally over the first 48 hours, and then begin to taper your use. You may take Extra Strength Tylenol or Tylenol only in place of the pain pills. DO NOT take ANY nonsteroidal anti-inflammatory pain medications: Advil, Motrin, Ibuprofen, Aleve, Naproxen, or Naprosyn.  °You may remove your dressing after two days.  °You may shower 5 days after surgery. The incision CANNOT get wet prior to 5 days. Simply allow the water to wash over the site and then pat dry. Do not rub the incision. Make sure your axilla (armpit) is completely dry after showering.  °Take one aspirin a day for 2 weeks after surgery, unless you have an aspirin sensitivity/allergy or asthma.  °Three to 5 times each day you should perform assisted overhead reaching and external rotation (outward turning) exercises with the operative arm. Both exercises should be done with the non-operative arm used as the "therapist arm" while the operative arm remains relaxed. Ten of each exercise should be done three to five times each day. ° ° ° °Overhead reach is helping to lift your stiff arm up as high as it will go. To stretch your overhead reach, lie flat on your back, relax, and grasp the wrist of the tight shoulder with your opposite hand. Using the power in your opposite arm, bring the stiff arm up as far as it is comfortable. Start holding it for ten seconds and then work up to where you can hold it for a count of 30. Breathe slowly and deeply while the arm is moved. Repeat this stretch ten times, trying to help the arm up a little higher each time.  ° ° ° ° ° °External rotation is turning the arm out to  the side while your elbow stays close to your body. External rotation is best stretched while you are lying on your back. Hold a cane, yardstick, broom handle, or dowel in both hands. Bend both elbows to a right angle. Use steady, gentle force from your normal arm to rotate the hand of the stiff shoulder out away from your body. Continue the rotation as far as it will go comfortably, holding it there for a count of 10. Repeat this exercise ten times.  ° ° ° °Please call [phone] during normal business hours or [phone] after hours for any problems. Including the following: ° °- excessive redness of the incisions °- drainage for more than 4 days °- fever of more than 101.5 F ° °*Please note that pain medications will not be refilled after hours or on weekends. ° ° ° °Post Anesthesia Home Care Instructions ° °Activity: °Get plenty of rest for the remainder of the day. A responsible adult should stay with you for 24 hours following the procedure.  °For the next 24 hours, DO NOT: °-Drive a car °-Operate machinery °-Drink alcoholic beverages °-Take any medication unless instructed by your physician °-Make any legal decisions or sign important papers. ° °Meals: °Start with liquid foods such as gelatin or soup. Progress to regular foods as tolerated. Avoid greasy, spicy, heavy foods. If nausea and/or vomiting occur, drink only clear liquids until the nausea and/or vomiting subsides. Call   your physician if vomiting continues. ° °Special Instructions/Symptoms: °Your throat may feel dry or sore from the anesthesia or the breathing tube placed in your throat during surgery. If this causes discomfort, gargle with warm salt water. The discomfort should disappear within 24 hours. ° °If you had a scopolamine patch placed behind your ear for the management of post- operative nausea and/or vomiting: ° °1. The medication in the patch is effective for 72 hours, after which it should be removed.  Wrap patch in a tissue and  discard in the trash. Wash hands thoroughly with soap and water. °2. You may remove the patch earlier than 72 hours if you experience unpleasant side effects which may include dry mouth, dizziness or visual disturbances. °3. Avoid touching the patch. Wash your hands with soap and water after contact with the patch. ° ° °  °Regional Anesthesia Blocks ° °1. Numbness or the inability to move the "blocked" extremity may last from 3-48 hours after placement. The length of time depends on the medication injected and your individual response to the medication. If the numbness is not going away after 48 hours, call your surgeon. ° °2. The extremity that is blocked will need to be protected until the numbness is gone and the  Strength has returned. Because you cannot feel it, you will need to take extra care to avoid injury. Because it may be weak, you may have difficulty moving it or using it. You may not know what position it is in without looking at it while the block is in effect. ° °3. For blocks in the legs and feet, returning to weight bearing and walking needs to be done carefully. You will need to wait until the numbness is entirely gone and the strength has returned. You should be able to move your leg and foot normally before you try and bear weight or walk. You will need someone to be with you when you first try to ensure you do not fall and possibly risk injury. ° °4. Bruising and tenderness at the needle site are common side effects and will resolve in a few days. ° °5. Persistent numbness or new problems with movement should be communicated to the surgeon or the [facility] ([phone])/ [facility] ([phone]). °

## 2015-06-21 NOTE — Telephone Encounter (Signed)
Please advise normal.  Philis Fendt, MS, PA-C   5:46 PM, 06/21/2015

## 2015-06-21 NOTE — H&P (Signed)
PREOPERATIVE H&P  Chief Complaint: l shoulder pain  HPI: Travis Ellis is a 59 y.o. male who presents for evaluation of l shoulder pain. It has been present for many months and has been worsening. He has failed conservative measures. Pain is rated as severe.  Past Medical History  Diagnosis Date  . Cancer [facility]) 2008    prostate  . GERD (gastroesophageal reflux disease)   . Hypertension   . Hyperlipidemia   . Arthritis   . Allergy   . Prostate cancer ([place])     [id number] adenocarcinoma of the prostate  . Neuromuscular disorder [facility])    Past Surgical History  Procedure Laterality Date  . Replacement total knee  2010    right knee  . Prostatectomy  12/2006    cancer  . Joint replacement Right 08/2009    knee  . Cholecystectomy N/A 03/16/2015    Procedure: LAPAROSCOPIC CHOLECYSTECTOMY WITH INTRAOPERATIVE CHOLANGIOGRAM;  Surgeon: [name], MD;  Location: [facility];  Service: General;  Laterality: N/A;   Social History   Social History  . Marital Status: Married    Spouse Name: N/A  . Number of Children: N/A  . Years of Education: N/A   Social History Main Topics  . Smoking status: Former Smoker    Types: Cigars  . Smokeless tobacco: Never Used  . Alcohol Use: Yes     Comment: rarely  . Drug Use: No  . Sexual Activity: Yes   Other Topics Concern  . None   Social History Narrative   Exercise walking daily for 1 mile   Family History  Problem Relation Age of Onset  . Breast cancer Mother   . Cancer Mother     skin  . Heart disease Father     heart attack  . COPD Father     smoker  . Heart disease Paternal Grandfather    No Known Allergies Prior to Admission medications   Medication Sig Start Date End Date Taking? Authorizing Provider  cetirizine (ZYRTEC) 10 MG tablet Take 10 mg by mouth daily.      Historical Provider, MD  lisinopril-hydrochlorothiazide (PRINZIDE,ZESTORETIC) 10-12.5 MG per tablet TAKE 1 TABLET BY MOUTH DAILY. 02/01/15   Orma Flaming, MD   Multiple Vitamins-Minerals (MULTIVITAMIN WITH MINERALS) tablet Take 1 tablet by mouth daily.      Historical Provider, MD     Positive ROS: none   All other systems have been reviewed and were otherwise negative with the exception of those mentioned in the HPI and as above.  Physical Exam: There were no vitals filed for this visit.  General: Alert, no acute distress Cardiovascular: No pedal edema Respiratory: No cyanosis, no use of accessory musculature GI: No organomegaly, abdomen is soft and non-tender Skin: No lesions in the area of chief complaint Neurologic: Sensation intact distally Psychiatric: Patient is competent for consent with normal mood and affect Lymphatic: No axillary or cervical lymphadenopathy  MUSCULOSKELETAL: l shoulder : painful rom //weaknesss of external rotation//  MRI :chronic retracted rotator cuff tear with muscular atrophy   Assessment/Plan: LEFT ROTATOR CUFF TEAR Plan for Procedure(s): SHOULDER ARTHROSCOPY WITH SUBACROMIAL DECOMPRESSION AND DISTAL CLAVICLE RESECTION  The risks benefits and alternatives were discussed with the patient including but not limited to the risks of nonoperative treatment, versus surgical intervention including infection, bleeding, nerve injury, malunion, nonunion, hardware prominence, hardware failure, need for hardware removal, blood clots, cardiopulmonary complications, morbidity, mortality, among others, and they were willing to proceed.  Predicted outcome is  good, although there will be at least a six to nine month expected recovery.  Babette Stum,Kalee L, MD 06/21/2015 6:54 AM

## 2015-06-21 NOTE — Anesthesia Procedure Notes (Addendum)
Anesthesia Regional Block:  Interscalene brachial plexus block  Pre-Anesthetic Checklist: ,, timeout performed, Correct Patient, Correct Site, Correct Laterality, Correct Procedure, Correct Position, site marked, Risks and benefits discussed,  Surgical consent,  Pre-op evaluation,  At surgeon's request and post-op pain management  Laterality: Left  Prep: chloraprep       Needles:  Injection technique: Single-shot  Needle Type: Echogenic Stimulator Needle     Needle Length: 5cm 5 cm Needle Gauge: 21 and 21 G    Additional Needles:  Procedures: ultrasound guided (picture in chart) and nerve stimulator Interscalene brachial plexus block  Nerve Stimulator or Paresthesia:  Response: 0.4 mA,   Additional Responses:   Narrative:  Start time: 06/21/2015 8:15 AM End time: 06/21/2015 8:25 AM Injection made incrementally with aspirations every 5 mL.  Performed by: Personally  Anesthesiologist: Lillia Abed  Additional Notes: Monitors applied. Patient sedated. Sterile prep and drape,hand hygiene and sterile gloves were used. Relevant anatomy identified.Needle position confirmed.Local anesthetic injected incrementally after negative aspiration. Local anesthetic spread visualized around nerve(s). Vascular puncture avoided. No complications. Image printed for medical record.The patient tolerated the procedure well.        Procedure Name: Intubation Date/Time: 06/21/2015 8:53 AM Performed by: Maryella Shivers Pre-anesthesia Checklist: Patient identified, Emergency Drugs available, Suction available and Patient being monitored Patient Re-evaluated:Patient Re-evaluated prior to inductionOxygen Delivery Method: Circle System Utilized Preoxygenation: Pre-oxygenation with 100% oxygen Intubation Type: IV induction Ventilation: Mask ventilation without difficulty Laryngoscope Size: Mac and 4 Grade View: Grade III Tube type: Oral Tube size: 8.0 mm Number of attempts: 2 Airway  Equipment and Method: Stylet and Oral airway Placement Confirmation: ETT inserted through vocal cords under direct vision,  positive ETCO2 and breath sounds checked- equal and bilateral Secured at: 22 cm Tube secured with: Tape Dental Injury: Teeth and Oropharynx as per pre-operative assessment

## 2015-06-21 NOTE — Brief Op Note (Signed)
06/21/2015  10:12 AM  PATIENT:  [name]  59 y.o. male  PRE-OPERATIVE DIAGNOSIS:  LEFT ROTATOR CUFF TEAR  POST-OPERATIVE DIAGNOSIS:  LEFT ROTATOR CUFF TEAR  PROCEDURE:  Procedure(s): SHOULDER ARTHROSCOPY WITH SUBACROMIAL DECOMPRESSION AND DISTAL CLAVICLE RESECTION (Left)  SURGEON:  Surgeon(s) and Role:    * Dorna Leitz, MD - Primary  PHYSICIAN ASSISTANT:   ASSISTANTS: bethune   ANESTHESIA:   general  EBL:  Total I/O In: 1000 [I.V.:1000] Out: -   BLOOD ADMINISTERED:none  DRAINS: none   LOCAL MEDICATIONS USED:  MARCAINE     SPECIMEN:  No Specimen  DISPOSITION OF SPECIMEN:  N/A  COUNTS:  YES  TOURNIQUET:  * No tourniquets in log *  DICTATION: .Other Dictation: Dictation Number [id number]  PLAN OF CARE: Discharge to home after PACU  PATIENT DISPOSITION:  PACU - hemodynamically stable.   Delay start of Pharmacological VTE agent (>24hrs) due to surgical blood loss or risk of bleeding: no

## 2015-06-21 NOTE — Transfer of Care (Signed)
Immediate Anesthesia Transfer of Care Note  Patient: Travis Ellis  Procedure(s) Performed: Procedure(s): SHOULDER ARTHROSCOPY WITH SUBACROMIAL DECOMPRESSION AND DISTAL CLAVICLE RESECTION (Left)  Patient Location: PACU  Anesthesia Type:GA combined with regional for post-op pain  Level of Consciousness: sedated  Airway & Oxygen Therapy: Patient Spontanous Breathing and Patient connected to face mask oxygen  Post-op Assessment: Report given to RN and Post -op Vital signs reviewed and stable  Post vital signs: Reviewed and stable  Last Vitals:  Filed Vitals:   06/21/15 0845  BP:   Pulse: 93  Temp:   Resp: 21    Complications: No apparent anesthesia complications

## 2015-06-22 NOTE — Telephone Encounter (Signed)
Pt.notified

## 2015-09-06 ENCOUNTER — Encounter: Payer: Self-pay | Admitting: Physician Assistant

## 2015-09-06 ENCOUNTER — Ambulatory Visit (INDEPENDENT_AMBULATORY_CARE_PROVIDER_SITE_OTHER): Payer: BLUE CROSS/BLUE SHIELD | Admitting: Physician Assistant

## 2015-09-06 VITALS — BP 119/77 | HR 100 | Temp 98.1°F | Resp 16 | Ht 72.5 in | Wt 203.8 lb

## 2015-09-06 DIAGNOSIS — Z23 Encounter for immunization: Secondary | ICD-10-CM

## 2015-09-06 DIAGNOSIS — I10 Essential (primary) hypertension: Secondary | ICD-10-CM

## 2015-09-06 DIAGNOSIS — E785 Hyperlipidemia, unspecified: Secondary | ICD-10-CM

## 2015-09-06 DIAGNOSIS — R000 Tachycardia, unspecified: Secondary | ICD-10-CM

## 2015-09-06 MED ORDER — SIMVASTATIN 20 MG PO TABS: 20.0000 mg | ORAL_TABLET | Freq: Every day | ORAL | Status: DC

## 2015-09-06 MED ORDER — ZOSTER VACCINE LIVE 19400 UNT/0.65ML ~~LOC~~ SOLR: 0.6500 mL | Freq: Once | SUBCUTANEOUS | Status: DC

## 2015-09-06 NOTE — Progress Notes (Signed)
09/06/2015 1:24 PM   DOB: [birth year] / MRN: [id number]  SUBJECTIVE:  Travis Ellis is a 60 y.o. male presenting for hypertension recheck.  He is taking Prinzide 10-12.5 daily without complaint.  CHL reveals well controlled hypertension.  Today he denies chest pain, cough, SOB and any new DOE.    He would like a work note today that will allow him to return to work, as he has been on medical hold for quite some time now 2/2 pancreatitis in mid 2016 and shoulder surgery.    He received the flu shot today at [facility].   His grandfather had a heart attack at age 81.  His father had a heart attack in his fifties.    Immunization History  Administered Date(s) Administered  . Influenza Split 09/06/2015  . Tdap 05/10/2015     He has No Known Allergies.   He  has a past medical history of Cancer ([place]) (2008); GERD (gastroesophageal reflux disease); Hypertension; Hyperlipidemia; Arthritis; Allergy; Prostate cancer ([place]); and Neuromuscular disorder ([place]).    He  reports that he has quit smoking. His smoking use included Cigars. He has never used smokeless tobacco. He reports that he drinks alcohol. He reports that he does not use illicit drugs. He  reports that he currently engages in sexual activity. The patient  has past surgical history that includes Replacement total knee (2010); Prostatectomy (12/2006); Joint replacement (Right, 08/2009); and Cholecystectomy (N/A, 03/16/2015).  His family history includes Breast cancer in his mother; COPD in his father; Cancer in his mother; Heart disease in his father and paternal grandfather.  Review of Systems  Constitutional: Negative for fever and chills.  Respiratory: Negative for cough.   Cardiovascular: Negative for chest pain and palpitations.  Gastrointestinal: Negative for nausea.  Musculoskeletal: Negative for myalgias.  Skin: Negative for rash.  Neurological: Negative for dizziness and headaches.    Problem list and medications reviewed and  updated by myself where necessary, and exist elsewhere in the encounter.   OBJECTIVE:  BP 119/77 mmHg  Pulse 117  Temp(Src) 98.1 F (36.7 C) (Oral)  Resp 16  Ht 6' 0.5" (1.842 m)  Wt 203 lb 12.8 oz (92.443 kg)  BMI 27.25 kg/m2  SpO2 94%  Physical Exam  Constitutional: He is oriented to person, place, and time. He appears well-developed and well-nourished. No distress.  Cardiovascular: Normal heart sounds and intact distal pulses.  Exam reveals no gallop and no friction rub.   No murmur heard. Pulmonary/Chest: Effort normal and breath sounds normal. No respiratory distress. He has no wheezes. He has no rales. He exhibits no tenderness.  Abdominal: Soft.  Neurological: He is alert and oriented to person, place, and time. No cranial nerve deficit.  Skin: Skin is warm and dry. He is not diaphoretic.  Vitals reviewed.   No results found for this or any previous visit (from the past 48 hour(s)).  ASSESSMENT AND PLAN  Sharon was seen today for follow-up.  Diagnoses and all orders for this visit:  Tachycardia with 100 - 120 beats per minute: Not new.  Most likely secondary to deconditioning.  He is asymptomatic today.   Benign essential HTN: Well controlled.  Maintain current plan.    Dyslipidemia: ASCVD score >7.5.  Will restart simvastatin at 20 mg per day.    The patient was advised to call or return to clinic if he does not see an improvement in symptoms or to seek the care of the closest emergency department if he  worsens with the above plan.   Philis Fendt, MHS, PA-C [facility] 09/06/2015 1:24 PM

## 2016-02-13 IMAGING — CT CT ABD-PELV W/ CM
1 of 4 series · 14 of 32 positions shown, 19 images · IV contrast (omnipaque)
Comparison: None.

CLINICAL DATA: Nausea and vomiting since [DATE] hours yesterday.
Central abdominal pain. White cell count 11.4. History of prostate
cancer post prostatectomy and radiation.

EXAM:
CT ABDOMEN AND PELVIS WITH CONTRAST
TECHNIQUE: Multidetector CT imaging of the abdomen and pelvis was performed
using the standard protocol following bolus administration of
intravenous contrast.
CONTRAST:  100mL OMNIPAQUE IOHEXOL 300 MG/ML  SOLN

[Series 2: abd/pel with · axial · 0.88mm/px · z∈[+154,+629]mm · 14 of 107 slices shown, 19 images]
[im 6/107  soft-tissue]
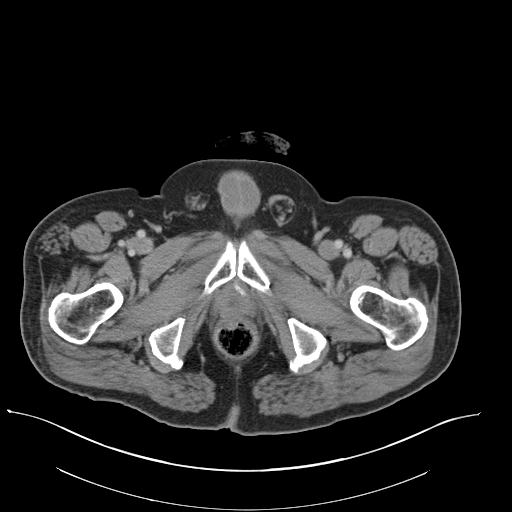
[im 6/107  bone]
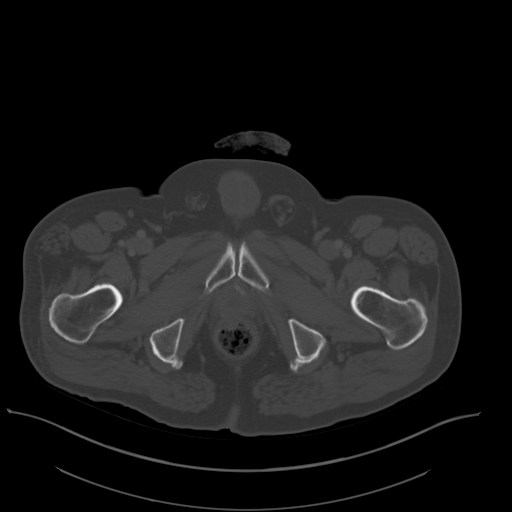
[im 12/107  soft-tissue]
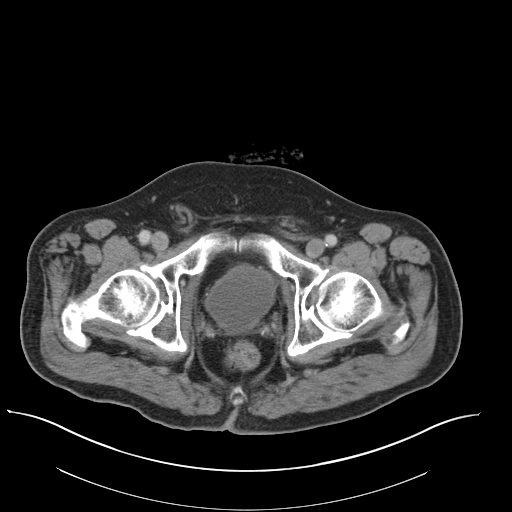
[im 24/107  soft-tissue]
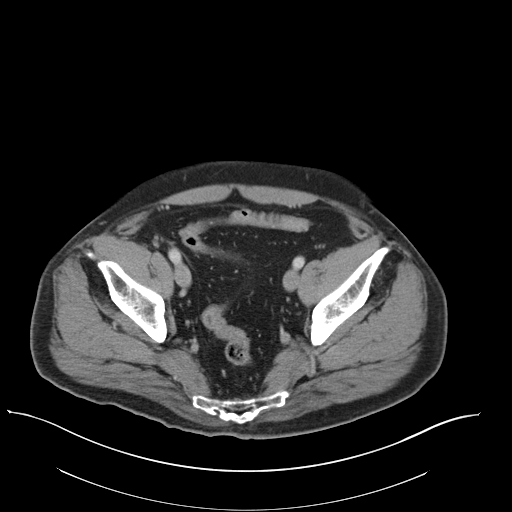
[im 30/107  soft-tissue]
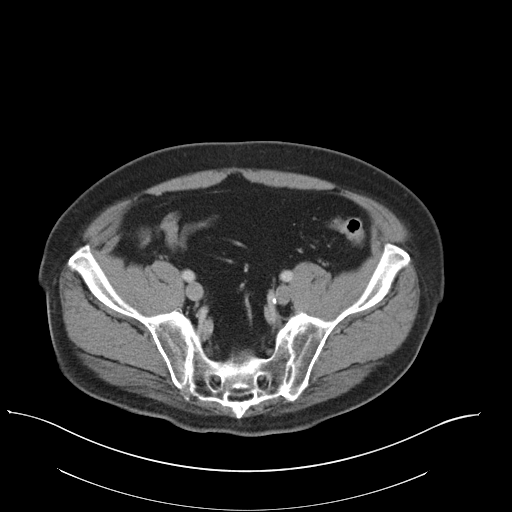
[im 36/107  soft-tissue]
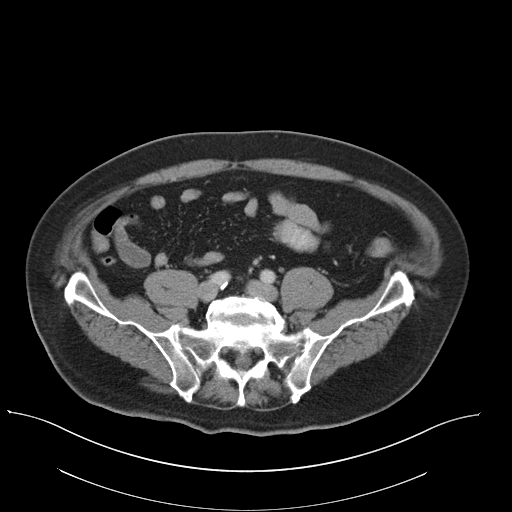
[im 48/107  soft-tissue]
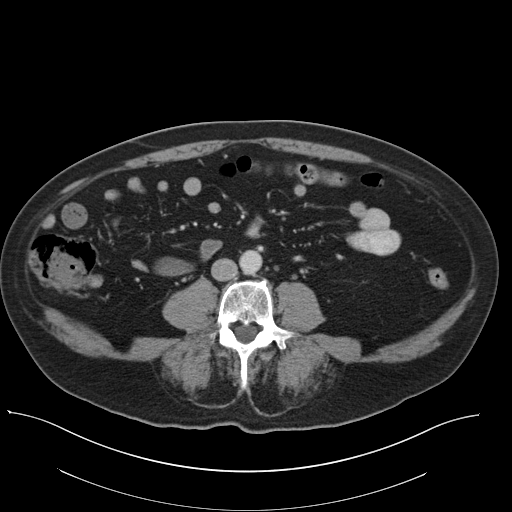
[im 54/107  soft-tissue]
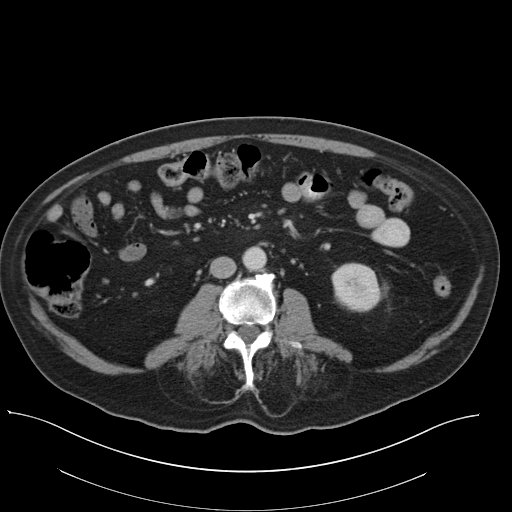
[im 59/107  soft-tissue]
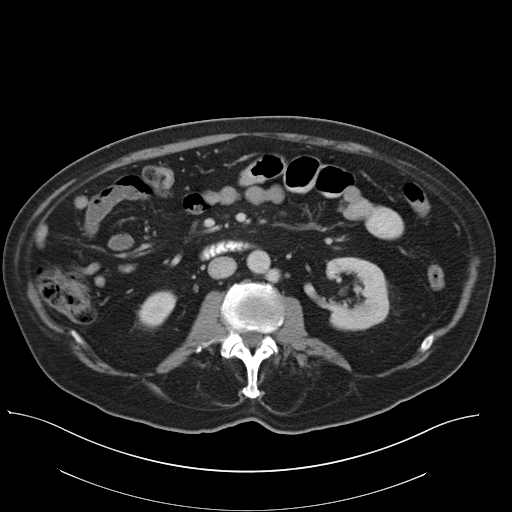
[im 71/107  soft-tissue]
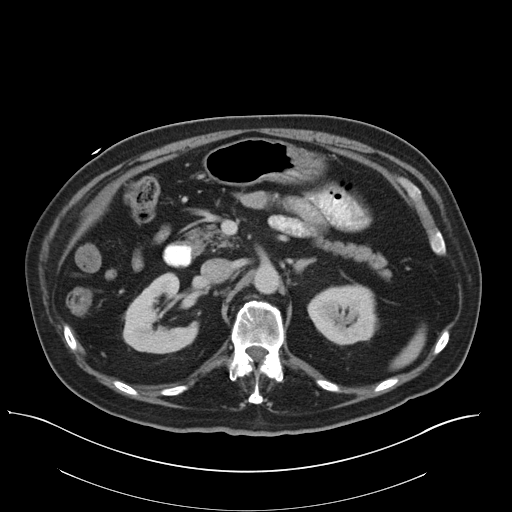
[im 71/107  bone]
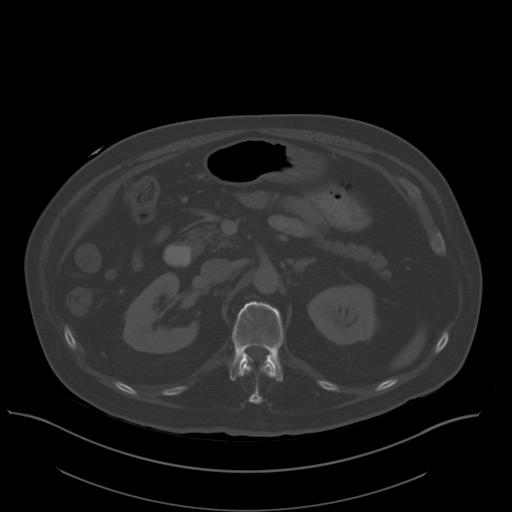
[im 77/107  soft-tissue]
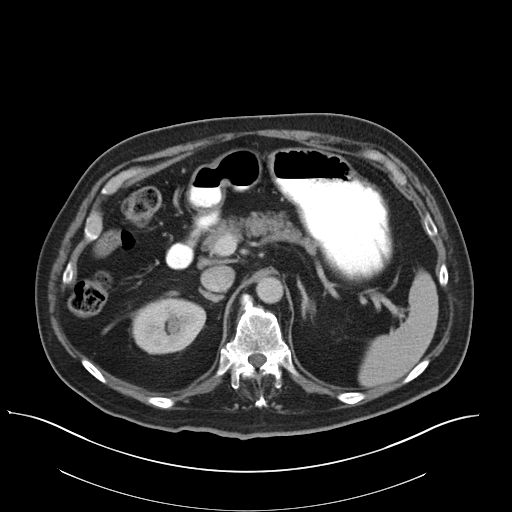
[im 83/107  soft-tissue]
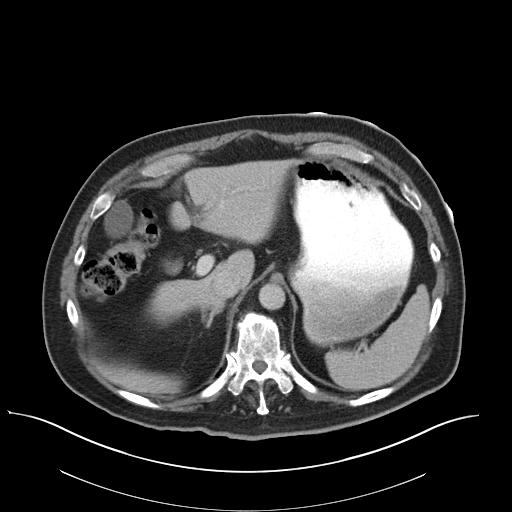
[im 83/107  lung]
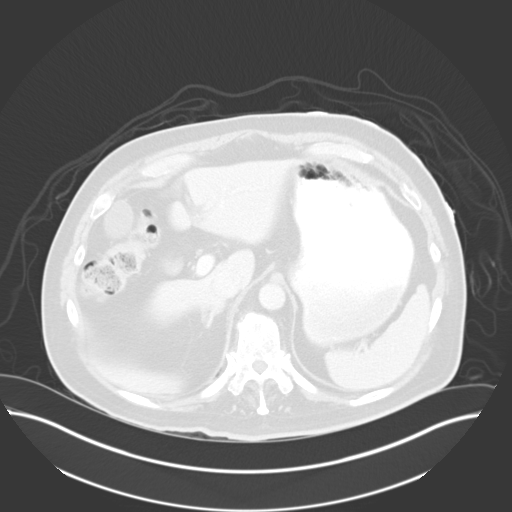
[im 89/107  lung]
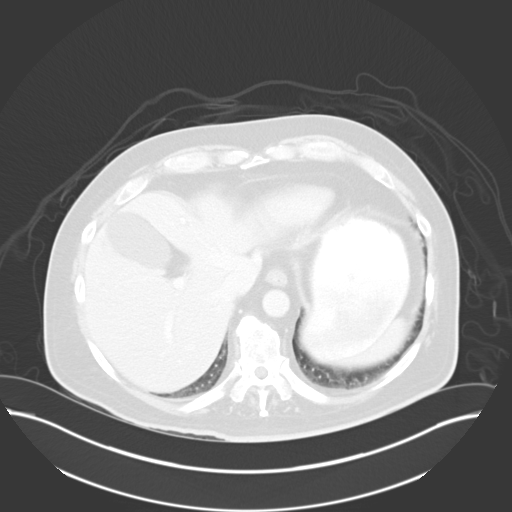
[im 95/107  soft-tissue]
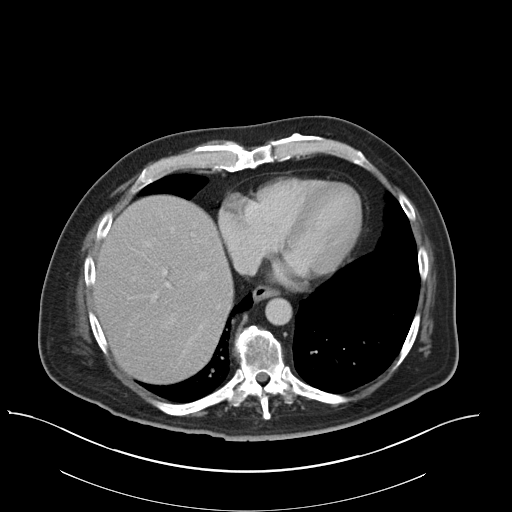
[im 95/107  lung]
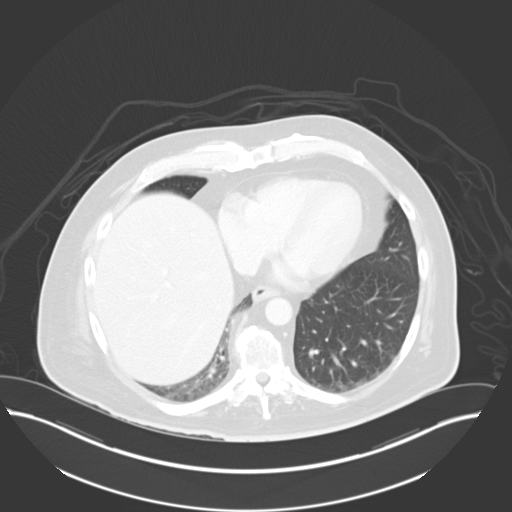
[im 101/107  soft-tissue]
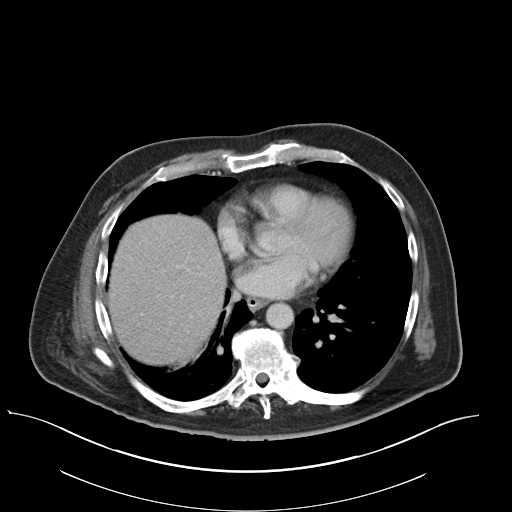
[im 101/107  lung]
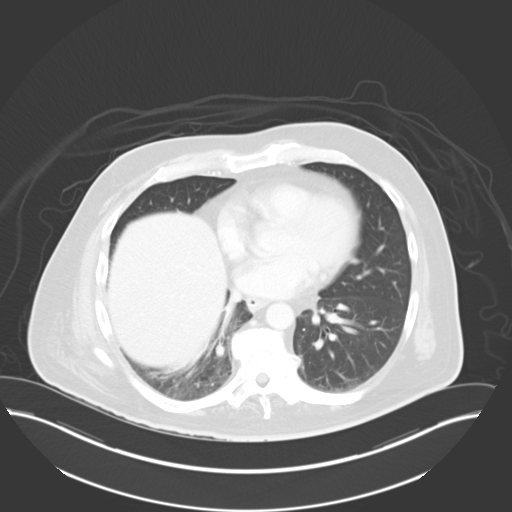

[14 of 32 positions shown; findings below may reference images not displayed]

FINDINGS: Atelectasis in the lung bases.

The liver, spleen, gallbladder, pancreas, adrenal glands, kidneys,
abdominal aorta, inferior vena cava, and retroperitoneal lymph nodes
are unremarkable. Retroaortic left renal vein. Stomach, small bowel,
and colon appear normal for degree of distention. No free air or
free fluid in the abdomen. Minimal fat in the umbilicus.

Pelvis: Appendix is normal. Bladder wall is not thickened. Surgical
absence of prostate gland. No pelvic mass or lymphadenopathy.
Degenerative changes throughout the spine. Degenerative changes in
the hips. No destructive or sclerotic bone lesions appreciated.
IMPRESSION: No acute process demonstrated in the abdomen or pelvis to account
for patient's symptoms. No evidence of bowel obstruction.

## 2016-03-06 ENCOUNTER — Encounter: Payer: Self-pay | Admitting: Physician Assistant

## 2016-03-06 ENCOUNTER — Ambulatory Visit (INDEPENDENT_AMBULATORY_CARE_PROVIDER_SITE_OTHER): Payer: BLUE CROSS/BLUE SHIELD | Admitting: Physician Assistant

## 2016-03-06 VITALS — BP 108/64 | HR 104 | Temp 97.5°F | Resp 16 | Ht 72.0 in | Wt 211.2 lb

## 2016-03-06 DIAGNOSIS — Z130 Encounter for screening for diseases of the blood and blood-forming organs and certain disorders involving the immune mechanism: Secondary | ICD-10-CM | POA: Diagnosis not present

## 2016-03-06 DIAGNOSIS — Z131 Encounter for screening for diabetes mellitus: Secondary | ICD-10-CM | POA: Diagnosis not present

## 2016-03-06 DIAGNOSIS — Z1329 Encounter for screening for other suspected endocrine disorder: Secondary | ICD-10-CM | POA: Diagnosis not present

## 2016-03-06 DIAGNOSIS — Z1389 Encounter for screening for other disorder: Secondary | ICD-10-CM | POA: Diagnosis not present

## 2016-03-06 DIAGNOSIS — Z0000 Encounter for general adult medical examination without abnormal findings: Secondary | ICD-10-CM | POA: Diagnosis not present

## 2016-03-06 DIAGNOSIS — R5381 Other malaise: Secondary | ICD-10-CM | POA: Diagnosis not present

## 2016-03-06 DIAGNOSIS — Z114 Encounter for screening for human immunodeficiency virus [HIV]: Secondary | ICD-10-CM | POA: Diagnosis not present

## 2016-03-06 DIAGNOSIS — Z13220 Encounter for screening for lipoid disorders: Secondary | ICD-10-CM

## 2016-03-06 LAB — COMPREHENSIVE METABOLIC PANEL
ALBUMIN: 4.3 g/dL (ref 3.6–5.1)
ALT: 31 U/L (ref 9–46)
AST: 25 U/L (ref 10–35)
Alkaline Phosphatase: 48 U/L (ref 40–115)
BILIRUBIN TOTAL: 0.6 mg/dL (ref 0.2–1.2)
BUN: 28 mg/dL — AB (ref 7–25)
CHLORIDE: 104 mmol/L (ref 98–110)
CO2: 24 mmol/L (ref 20–31)
CREATININE: 0.95 mg/dL (ref 0.70–1.25)
Calcium: 9.7 mg/dL (ref 8.6–10.3)
GLUCOSE: 106 mg/dL — AB (ref 65–99)
Potassium: 3.8 mmol/L (ref 3.5–5.3)
SODIUM: 139 mmol/L (ref 135–146)
Total Protein: 7.1 g/dL (ref 6.1–8.1)

## 2016-03-06 LAB — LIPID PANEL
Cholesterol: 140 mg/dL (ref 125–200)
HDL: 36 mg/dL — ABNORMAL LOW (ref >=40)
LDL CALC: 64 mg/dL (ref <130)
TRIGLYCERIDES: 199 mg/dL — AB (ref <150)
Total CHOL/HDL Ratio: 3.9 Ratio (ref <=5.0)
VLDL: 40 mg/dL — AB (ref <30)

## 2016-03-06 LAB — HEMOGLOBIN A1C
Hgb A1c MFr Bld: 5.7 % — ABNORMAL HIGH (ref <5.7)
MEAN PLASMA GLUCOSE: 117 mg/dL

## 2016-03-06 LAB — CBC
HEMATOCRIT: 47.6 % (ref 38.5–50.0)
Hemoglobin: 16.7 g/dL (ref 13.2–17.1)
MCH: 33.6 pg — AB (ref 27.0–33.0)
MCHC: 35.1 g/dL (ref 32.0–36.0)
MCV: 95.8 fL (ref 80.0–100.0)
MPV: 11 fL (ref 7.5–12.5)
PLATELETS: 229 10*3/uL (ref 140–400)
RBC: 4.97 MIL/uL (ref 4.20–5.80)
RDW: 12.6 % (ref 11.0–15.0)
WBC: 9 10*3/uL (ref 3.8–10.8)

## 2016-03-06 LAB — TSH: TSH: 1.05 m[IU]/L (ref 0.40–4.50)

## 2016-03-06 NOTE — Patient Instructions (Signed)
     IF you received an x-ray today, you will receive an invoice from [facility]. Please contact  Radiology at [phone] with questions or concerns regarding your invoice.   IF you received labwork today, you will receive an invoice from [facility]. Please contact [facility] at [phone] with questions or concerns regarding your invoice.   Our billing staff will not be able to assist you with questions regarding bills from these companies.  You will be contacted with the lab results as soon as they are available. The fastest way to get your results is to activate your My Chart account. Instructions are located on the last page of this paperwork. If you have not heard from us regarding the results in 2 weeks, please contact this office.      

## 2016-03-06 NOTE — Progress Notes (Signed)
03/06/2016 3:50 PM   DOB: 03-25-1956 / MRN: [id number]  SUBJECTIVE:  Travis Ellis is a 60 y.o. male presenting for an annual physical.  Last colonoscopy was in 2012 and it was recommended he return in 10 years for repeat.  The remainder of his preventative maintenance is up to date.   He has several operations and a hospitalization over the last two years that have left him weak. It bothers him that he is weak and he feels that he needs some help getting his strength back.     Immunization History  Administered Date(s) Administered  . Influenza Split 09/06/2015  . Tdap 05/10/2015   He has No Known Allergies.   He  has a past medical history of Cancer ([place]) (2008); GERD (gastroesophageal reflux disease); Hypertension; Hyperlipidemia; Arthritis; Allergy; Prostate cancer ([place]); and Neuromuscular disorder ([place]).    He  reports that he has quit smoking. His smoking use included Cigars. He has never used smokeless tobacco. He reports that he drinks alcohol. He reports that he does not use illicit drugs. He  reports that he currently engages in sexual activity. The patient  has past surgical history that includes Replacement total knee (2010); Prostatectomy (12/2006); Joint replacement (Right, 08/2009); and Cholecystectomy (N/A, 03/16/2015).  His family history includes Breast cancer in his mother; COPD in his father; Cancer in his mother; Heart disease in his father and paternal grandfather; Hyperlipidemia in his paternal grandfather; Hypertension in his father.  Review of Systems  Constitutional: Negative for fever and chills.  Respiratory: Negative for cough.   Cardiovascular: Negative for chest pain and palpitations.  Gastrointestinal: Negative for nausea.  Musculoskeletal: Negative for myalgias.  Skin: Negative for rash.  Neurological: Negative for dizziness and headaches.    Problem list and medications reviewed and updated by myself where necessary, and exist elsewhere in the  encounter.   OBJECTIVE:  BP 108/64 mmHg  Pulse 104  Temp(Src) 97.5 F (36.4 C) (Oral)  Resp 16  Ht 6' (1.829 m)  Wt 211 lb 3.2 oz (95.8 kg)  BMI 28.64 kg/m2  SpO2 98%  Physical Exam  Constitutional: He is oriented to person, place, and time. He appears well-developed and well-nourished. No distress.  Cardiovascular: Normal heart sounds and intact distal pulses.  Exam reveals no gallop and no friction rub.   No murmur heard. Pulmonary/Chest: Effort normal and breath sounds normal. No respiratory distress. He has no wheezes. He has no rales. He exhibits no tenderness.  Abdominal: Soft.  Neurological: He is alert and oriented to person, place, and time. No cranial nerve deficit.  Skin: Skin is warm and dry. He is not diaphoretic.  Vitals reviewed.   Lab Results  Component Value Date   TSH 1.017 06/14/2015   Lab Results  Component Value Date   WBC 6.9 06/14/2015   HGB 16.3 06/14/2015   HCT 47.5 06/14/2015   MCV 95.6 06/14/2015   PLT 265 06/14/2015   Lab Results  Component Value Date   HGBA1C 5.5 06/14/2015   Lab Results  Component Value Date   ALT 27 06/14/2015   AST 22 06/14/2015   ALKPHOS 54 06/14/2015   BILITOT 0.6 06/14/2015   Lab Results  Component Value Date   NA 138 06/14/2015   K 3.8 06/14/2015   CL 101 06/14/2015   CO2 27 06/14/2015   Lab Results  Component Value Date   WBC 6.9 06/14/2015   HGB 16.3 06/14/2015   HCT 47.5 06/14/2015   MCV 95.6  06/14/2015   PLT 265 06/14/2015   Lab Results  Component Value Date   CHOL 172 06/14/2015   HDL 28* 06/14/2015   LDLCALC 88 06/14/2015   TRIG 279* 06/14/2015   CHOLHDL 6.1* 06/14/2015   No results found.  ASSESSMENT AND PLAN  Kiran was seen today for annual exam.  Diagnoses and all orders for this visit:  Encounter for annual physical exam  Screening for deficiency anemia -     CBC  Screening for nephropathy -     Comprehensive metabolic panel  Screening for diabetes mellitus -      Hemoglobin A1c  Screening for lipid disorders -     Lipid panel  Screening for thyroid disorder -     TSH  Screening for HIV (human immunodeficiency virus) -     HIV antibody  Physical deconditioning -     Ambulatory referral to Physical Therapy    The patient was advised to call or return to clinic if he does not see an improvement in symptoms, or to seek the care of the closest emergency department if he worsens with the above plan.   Philis Fendt, MHS, PA-C [facility] 03/06/2016 3:50 PM

## 2016-03-07 LAB — HIV ANTIBODY (ROUTINE TESTING W REFLEX): HIV 1&2 Ab, 4th Generation: NONREACTIVE

## 2016-03-14 ENCOUNTER — Ambulatory Visit: Payer: BLUE CROSS/BLUE SHIELD | Attending: Physician Assistant

## 2016-03-14 ENCOUNTER — Encounter: Payer: Self-pay | Admitting: Physician Assistant

## 2016-03-14 DIAGNOSIS — M21371 Foot drop, right foot: Secondary | ICD-10-CM | POA: Diagnosis present

## 2016-03-14 DIAGNOSIS — R2689 Other abnormalities of gait and mobility: Secondary | ICD-10-CM | POA: Diagnosis present

## 2016-03-14 DIAGNOSIS — M6281 Muscle weakness (generalized): Secondary | ICD-10-CM | POA: Insufficient documentation

## 2016-03-14 NOTE — Therapy (Signed)
[facility] [address] [place], [place], [address] Phone: [phone]   Fax:  [phone]  Physical Therapy Evaluation  Patient Details  Name: Travis Ellis MRN: [id number] Date of Birth: 07-Jul-1956 Referring Provider: Philis Fendt, PA-C  Encounter Date: 03/14/2016      PT End of Session - 03/14/16 1305    Visit Number 1   Number of Visits 9   Date for PT Re-Evaluation 04/13/16   Authorization Type BCBS   PT Start Time 0930   PT Stop Time 1010   PT Time Calculation (min) 40 min   Equipment Utilized During Treatment --  S-min A for safety    Activity Tolerance Patient tolerated treatment well   Behavior During Therapy [facility] for tasks assessed/performed      Past Medical History  Diagnosis Date  . Cancer [facility]) 2008    prostate  . GERD (gastroesophageal reflux disease)   . Hypertension   . Hyperlipidemia   . Arthritis   . Allergy   . Prostate cancer ([place])     pT2aN0Mx adenocarcinoma of the prostate  . Neuromuscular disorder [facility])     Past Surgical History  Procedure Laterality Date  . Replacement total knee  2010    right knee  . Prostatectomy  12/2006    cancer  . Joint replacement Right 08/2009    knee  . Cholecystectomy N/A 03/16/2015    Procedure: LAPAROSCOPIC CHOLECYSTECTOMY WITH INTRAOPERATIVE CHOLANGIOGRAM;  Surgeon: [name], MD;  Location: [facility];  Service: General;  Laterality: N/A;    There were no vitals filed for this visit.       Subjective Assessment - 03/14/16 0933    Subjective Pt reported he became out of shape after surgery for gallbladder removal (03/2015)and surgery for L rotator cuff repair (11/2014) after a fall in the yard (tripped in yard). 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  Pt reported foot  drop began around the same time he was dx with prostate CA but did not have chemo. Pt reported he purchased on off the shelf R AFO, as [employer] took too long to get back to him regarding custom R AFO. He does not use it.    Pertinent History HTN, Hx R foot drop,neuropathy B LE (beginning at knee)  L rotator cuff surgery 11/2014, gallbladder surgery 03/2015, hx of prostate CA approx. 10 years ago, mild incontinence   Patient Stated Goals Strengthening my legs and general overall strengthening   Currently in Pain? Yes   Pain Score --  0.5-1/10   Pain Location Back   Pain Orientation Lower   Pain Descriptors / Indicators Dull   Pain Type Chronic pain   Pain Onset More than a month ago   Pain Frequency Intermittent   Aggravating Factors  moving around , bending and lifting (working on [employer])   Pain Relieving Factors rest             [facility] PT Assessment - 03/14/16 0947    Assessment   Medical Diagnosis Physical deconditioning   Referring Provider Philis Fendt, PA-C   Onset Date/Surgical Date 03/15/15   Prior Therapy Acute and OPPT ortho after surgeries   Precautions   Precautions Fall   Restrictions   Weight Bearing Restrictions No  Balance Screen   Has the patient fallen in the past 6 months Yes   How many times? 1  on uneven ground in backyard   Has the patient had a decrease in activity level because of a fear of falling?  No   Is the patient reluctant to leave their home because of a fear of falling?  No   Home Ecologist residence   Living Arrangements Spouse/significant other   Available Help at Discharge Family   Type of [place] to enter   Entrance Stairs-Number of Steps 2   Entrance Stairs-Rails None   Home Layout Multi-level;Able to live on main level with bedroom/bathroom   Alternate Level Stairs-Number of Steps 12   Alternate Level Stairs-Rails Right   Home Equipment Cane - single point;Walker - 2  wheels;Walker - standard;Grab bars - tub/shower;Grab bars - toilet   Prior Function   Level of Independence Independent   Vocation Retired   Leisure Reading and performing projects around the house, spending time with wife   Cognition   Overall Cognitive Status Within Functional Limits for tasks assessed   Observation/Other Assessments   Focus on Therapeutic Outcomes (FOTO)  66.3%-scores closer to 100% represent complete conficence in balance.   Sensation   Light Touch Appears Intact   Posture/Postural Control   Posture/Postural Control Postural limitations   Postural Limitations Rounded Shoulders;Forward head;Posterior pelvic tilt;Decreased lumbar lordosis;Increased thoracic kyphosis  hips ant. and trunk posterior   ROM / Strength   AROM / PROM / Strength AROM;Strength   AROM   Overall AROM  Deficits   Overall AROM Comments B UE WFL, L LE AROM WFL. R ankle DF and PF limited 2/2 weakness.   Strength   Overall Strength Deficits   Overall Strength Comments B UE WFL. L LE WFL (4-5/5 globally) except for 3/5 ankle DF and PF. R hip: 4/5, knee ext/flex: 4/5, R DF: 2-/5, R PF, 2/5, R eversion: 3/5. Suspected B hip ext. weakness 2/2 posture and gait pattern   Transfers   Transfers Sit to Stand;Stand to Sit   Sit to Stand 5: Supervision;Without upper extremity assist;With upper extremity assist;From chair/3-in-1   Stand to Sit 5: Supervision;With upper extremity assist;Without upper extremity assist;To chair/3-in-1   Ambulation/Gait   Ambulation/Gait Yes   Ambulation/Gait Assistance 5: Supervision   Ambulation/Gait Assistance Details No overt LOB, pt noted to amb. with hips in ant. position and trunk in posterior position.   Ambulation Distance (Feet) 100 Feet   Assistive device None   Gait Pattern Step-through pattern;Decreased stride length;Decreased dorsiflexion - right;Right steppage;Wide base of support;Poor foot clearance - right   Ambulation Surface Level;Indoor   Gait velocity  2.37ft/sec.   Balance   Balance Assessed Yes   Static Standing Balance   Static Standing - Balance Support No upper extremity supported   Static Standing - Level of Assistance 4: Min assist;5: Stand by assistance   Static Standing Balance -  Activities  Single Leg Stance - Right Leg;Single Leg Stance - Left Leg   Static Standing - Comment/# of Minutes Pt performed feet apart and feet together eyes open and no LOB, pt required min A after 5 sec. of feet together with eyes closed and after 5 sec. of L SLS and 1-2 sec. of R SLS.   Standardized Balance Assessment   Standardized Balance Assessment Timed Up and Go Test   Timed Up and Go Test   TUG Normal TUG  Normal TUG (seconds) 8.3                           PT Education - 03/14/16 1305    Education provided Yes   Education Details PT discussed outcome measure results and PT frequency/duration.   Person(s) Educated Patient   Methods Explanation   Comprehension Verbalized understanding          PT Short Term Goals - 03/14/16 1313    PT SHORT TERM GOAL #1   Title same as LTGs           PT Long Term Goals - 03/14/16 1313    PT LONG TERM GOAL #1   Title Pt will be IND In HEP to improve balance and strength. Target date: 04/11/16   Status New   PT LONG TERM GOAL #2   Title Pt will improve ABC score to >/=81% to improve confidence in balance and reduce fear of falling. Target date: 04/11/16   Status New   PT LONG TERM GOAL #3   Title Perform 6 MWT and BERG and write goals if appropriate. Target date: 04/11/16   Status New   PT LONG TERM GOAL #4   Title Pt will amb. 600' over even /uneven terrain at MOD I level to improve functional mobiity. Target date: 04/11/16   Status New   PT LONG TERM GOAL #5   Title Pt will traverse 12 steps with one handrail at MOD I level to navigate stairs at home safely. Target date: 04/11/16   Status New               Plan - 03/14/16 1309    Clinical Impression Statement  Pt is a pleasant 60y/o male presenting to OPPT neuro for physical deconditioning and LE weakness. PMH signifianct for: HTN, gallbladder and L rotator cuff surgery in 2016, neuropathy resulting in B LE weakness but no sensation impairments, prostate CA, mild incontinence. Pt's exam revealed the following deficits: B LE weakness, decr. AROM, decr. endurance, impaired posture, impaired balance, and gait deviations. Pt's TUG score and gait speed indicate pt is not at risk for falls, however, pt has experienced 2 falls within 1 year. PT will formally assess balance and endurance next session.    Rehab Potential Good   Clinical Impairments Affecting Rehab Potential co-morbidities   PT Frequency 2x / week   PT Duration 4 weeks   PT Treatment/Interventions ADLs/Self Care Home Management;Biofeedback;DME Instruction;Gait training;Stair training;Vestibular;Manual techniques;Functional mobility training;Therapeutic activities;Therapeutic exercise;Electrical Stimulation;Canalith Repostioning;Balance training;Neuromuscular re-education;Orthotic Fit/Training;Patient/family education   PT Next Visit Plan Perform 6MWT and BERG and provid pt with strengthening and balance HEP.    Consulted and Agree with Plan of Care Patient      Patient will benefit from skilled therapeutic intervention in order to improve the following deficits and impairments:  Abnormal gait, Decreased activity tolerance, Decreased strength, Decreased mobility, Decreased balance, Decreased range of motion, Pain, Decreased endurance (PT will continue to monitor pain but will not directly address.)  Visit Diagnosis: Foot drop, right - Plan: PT plan of care cert/re-cert  Muscle weakness (generalized) - Plan: PT plan of care cert/re-cert     Problem List Patient Active Problem List   Diagnosis Date Noted  . Prostate cancer ([place]) 09/14/2012  . Abnormal gait 05/21/2012  . HTN (hypertension) 02/03/2012  . Neuropathy, leg 02/03/2012     Ottis Vacha L 03/14/2016, 2:18 PM  [facility] [address]  [place], [place], [address] Phone: [phone]   Fax:  [phone]  Name: Travis Ellis MRN: [id number] Date of Birth: 07-22-1956   Geoffry Paradise, PT,DPT 03/14/2016 2:18 PM Phone: [phone] Fax: [phone]

## 2016-03-16 ENCOUNTER — Other Ambulatory Visit: Payer: Self-pay | Admitting: Internal Medicine

## 2016-03-19 ENCOUNTER — Encounter: Payer: Self-pay | Admitting: Physical Therapy

## 2016-03-19 ENCOUNTER — Ambulatory Visit: Payer: BLUE CROSS/BLUE SHIELD | Admitting: Physical Therapy

## 2016-03-19 DIAGNOSIS — M6281 Muscle weakness (generalized): Secondary | ICD-10-CM

## 2016-03-19 DIAGNOSIS — M21371 Foot drop, right foot: Secondary | ICD-10-CM

## 2016-03-19 NOTE — Patient Instructions (Signed)
Walking Program:  Begin walking for exercise for 6 minutes, 2-3 times/day, 5 days/week.   Progress your walking program by adding 2 minutes to your routine each week, as tolerated. Be sure to wear good walking shoes, walk in a safe environment and only progress to your tolerance.      * Indoor places such as [employer], [facility], other shopping centers offer climate controlled walking spots

## 2016-03-19 NOTE — Therapy (Addendum)
[facility] [address] [place], [place], [address] Phone: [phone]   Fax:  [phone]  Physical Therapy Treatment  Patient Details  Name: Travis Ellis MRN: [id number] Date of Birth: 22-Dec-1955 Referring Provider: Philis Fendt, PA-C  Encounter Date: 03/19/2016      PT End of Session - 03/19/16 1139    Visit Number 2   Number of Visits 9   Date for PT Re-Evaluation 04/13/16   Authorization Type BCBS   PT Start Time 0848   PT Stop Time 0930   PT Time Calculation (min) 42 min   Equipment Utilized During Treatment Gait belt   Activity Tolerance Patient tolerated treatment well   Behavior During Therapy [facility] for tasks assessed/performed      Past Medical History  Diagnosis Date  . Cancer [facility]) 2008    prostate  . GERD (gastroesophageal reflux disease)   . Hypertension   . Hyperlipidemia   . Arthritis   . Allergy   . Prostate cancer ([place])     pT2aN0Mx adenocarcinoma of the prostate  . Neuromuscular disorder [facility])     Past Surgical History  Procedure Laterality Date  . Replacement total knee  2010    right knee  . Prostatectomy  12/2006    cancer  . Joint replacement Right 08/2009    knee  . Cholecystectomy N/A 03/16/2015    Procedure: LAPAROSCOPIC CHOLECYSTECTOMY WITH INTRAOPERATIVE CHOLANGIOGRAM;  Surgeon: [name], MD;  [facility];  Service: General;  Laterality: N/A;    There were no vitals filed for this visit.      Subjective Assessment - 03/19/16 0852    Subjective No new complaints. No new falls.    Pertinent History Likes to be called "[name]"; HTN, Hx R foot drop,neuropathy B LE (beginning at knee)  L rotator cuff surgery 11/2014, gallbladder surgery 03/2015, hx of prostate CA approx. 10 years ago, mild incontinence   Patient Stated Goals Strengthening my legs and general overall strengthening   Currently in Pain? Yes   Pain Score 2    Pain Location Generalized   Pain  Descriptors / Indicators Aching   Pain Type Chronic pain   Pain Onset More than a month ago   Pain Frequency Intermittent   Aggravating Factors  moving around, "getting old"   Pain Relieving Factors rest, stretching            [facility] PT Assessment - 03/19/16 0855    6 Minute Walk- Baseline   6 Minute Walk- Baseline yes   BP (mmHg) 105/74 mmHg   HR (bpm) 94   02 Sat (%RA) 96 %   Modified Borg Scale for Dyspnea 0- Nothing at all   Perceived Rate of Exertion (Borg) 6-   6 Minute walk- Post Test   6 Minute Walk Post Test yes   BP (mmHg) 157/77 mmHg   HR (bpm) 102   02 Sat (%RA) 94 %   Modified Borg Scale for Dyspnea 0.5- Very, very slight shortness of breath   Perceived Rate of Exertion (Borg) 11- Fairly light   6 minute walk test results    Aerobic Endurance Distance Walked 820   Endurance additional comments no AD, no rest breaks needed. Pt did demo slight decrease in gait speed as time progressed.    Berg Balance Test   Sit to Stand Able to stand without using hands and stabilize independently   Standing Unsupported Able to stand safely 2 minutes   Sitting  with Back Unsupported but Feet Supported on Floor or Stool Able to sit safely and securely 2 minutes   Stand to Sit Sits safely with minimal use of hands   Transfers Able to transfer safely, minor use of hands   Standing Unsupported with Eyes Closed Able to stand 10 seconds safely   Standing Ubsupported with Feet Together Able to place feet together independently and stand 1 minute safely   From Standing, Reach Forward with Outstretched Arm Can reach confidently >25 cm (10")   From Standing Position, Pick up Object from Floor Able to pick up shoe safely and easily   From Standing Position, Turn to Look Behind Over each Shoulder Looks behind from both sides and weight shifts well   Turn 360 Degrees Able to turn 360 degrees safely but slowly   Standing Unsupported, Alternately Place Feet on Step/Stool Able to complete 4 steps  without aid or supervision   Standing Unsupported, One Foot in Front Able to take small step independently and hold 30 seconds   Standing on One Leg Able to lift leg independently and hold equal to or more than 3 seconds   Total Score 48   Berg comment: 48/56= moderate fall risk (>50%)            PT Education - 03/19/16 0929    Education provided Yes   Education Details HEP: walking program; results of Berg Balance Test and 6 minute walking test.   Person(s) Educated Patient   Methods Explanation;Demonstration;Handout   Comprehension Verbalized understanding;Returned demonstration;Verbal cues required;Need further instruction          PT Short Term Goals - 03/14/16 1313    PT SHORT TERM GOAL #1   Title same as LTGs           PT Long Term Goals - 03/19/16 1657    PT LONG TERM GOAL #1   Title Pt will be IND In HEP to improve balance and strength. Target date: 04/11/16   Status New   PT LONG TERM GOAL #2   Title Pt will improve ABC score to >/=81% to improve confidence in balance and reduce fear of falling. Target date: 04/11/16   Status New   PT LONG TERM GOAL #3   Title Perform 6 MWT and BERG and write goals if appropriate. Target date: 04/11/16   Status Achieved   PT LONG TERM GOAL #4   Title Pt will amb. 600' over even /uneven terrain at MOD I level to improve functional mobiity. Target date: 04/11/16   Status New   PT LONG TERM GOAL #5   Title Pt will traverse 12 steps with one handrail at MOD I level to navigate stairs at home safely. Target date: 04/11/16   Status New   Additional Long Term Goals   Additional Long Term Goals Yes   PT LONG TERM GOAL #6   Title Pt will improve BERG score to >/=52/56 to decr. falls risk. Target date: 04/11/16   Status New   PT LONG TERM GOAL #7   Title Pt will improve 6MWT distance to >/=933' to improve endurance. Target date: 04/11/16   Status New            Plan - 03/19/16 0854    Clinical Impression Statement Established  base line values for 6 minute walk test and Berg Balance test today, those results were reviewed with pt. Primary PT to set goals as appropriate based on values obtained today. Also issued walking program to  pt's HEP. Pt is making progress toward goals.    Rehab Potential Good   Clinical Impairments Affecting Rehab Potential co-morbidities   PT Frequency 2x / week   PT Duration 4 weeks   PT Treatment/Interventions ADLs/Self Care Home Management;Biofeedback;DME Instruction;Gait training;Stair training;Vestibular;Manual techniques;Functional mobility training;Therapeutic activities;Therapeutic exercise;Electrical Stimulation;Canalith Repostioning;Balance training;Neuromuscular re-education;Orthotic Fit/Training;Patient/family education   PT Next Visit Plan  provide pt with strengthening and balance HEP.    Consulted and Agree with Plan of Care Patient      Patient will benefit from skilled therapeutic intervention in order to improve the following deficits and impairments:  Abnormal gait, Decreased activity tolerance, Decreased strength, Decreased mobility, Decreased balance, Decreased range of motion, Pain, Decreased endurance (PT will continue to monitor pain but will not directly address.)  Visit Diagnosis: Foot drop, right  Muscle weakness (generalized)     Problem List Patient Active Problem List   Diagnosis Date Noted  . Prostate cancer ([place]) 09/14/2012  . Abnormal gait 05/21/2012  . HTN (hypertension) 02/03/2012  . Neuropathy, leg 02/03/2012    Willow Ora, PTA, [facility] [address] [place], [place] [address] [phone] 03/19/2016, 4:58 PM   Name: Travis Ellis MRN: [id number] Date of Birth: March 15, 1956

## 2016-03-21 ENCOUNTER — Ambulatory Visit: Payer: BLUE CROSS/BLUE SHIELD

## 2016-03-21 DIAGNOSIS — M21371 Foot drop, right foot: Secondary | ICD-10-CM | POA: Diagnosis not present

## 2016-03-21 DIAGNOSIS — M6281 Muscle weakness (generalized): Secondary | ICD-10-CM

## 2016-03-21 DIAGNOSIS — R2689 Other abnormalities of gait and mobility: Secondary | ICD-10-CM

## 2016-03-21 NOTE — Therapy (Signed)
[facility] [address] [place], [place], [address] Phone: [phone]   Fax:  [phone]  Physical Therapy Treatment  Patient Details  Name: Travis Ellis MRN: [id number] Date of Birth: 02/12/1956 Referring Provider: Philis Fendt, PA-C  Encounter Date: 03/21/2016      PT End of Session - 03/21/16 1245    Visit Number 3   Number of Visits 9   Date for PT Re-Evaluation 04/13/16   Authorization Type BCBS   PT Start Time 0932   PT Stop Time 1013   PT Time Calculation (min) 41 min   Equipment Utilized During Treatment Gait belt   Activity Tolerance Patient tolerated treatment well   Behavior During Therapy [facility] for tasks assessed/performed      Past Medical History  Diagnosis Date  . Cancer [facility]) 2008    prostate  . GERD (gastroesophageal reflux disease)   . Hypertension   . Hyperlipidemia   . Arthritis   . Allergy   . Prostate cancer ([place])     pT2aN0Mx adenocarcinoma of the prostate  . Neuromuscular disorder [facility])     Past Surgical History  Procedure Laterality Date  . Replacement total knee  2010    right knee  . Prostatectomy  12/2006    cancer  . Joint replacement Right 08/2009    knee  . Cholecystectomy N/A 03/16/2015    Procedure: LAPAROSCOPIC CHOLECYSTECTOMY WITH INTRAOPERATIVE CHOLANGIOGRAM;  Surgeon: [name], MD;  [facility];  Service: General;  Laterality: N/A;    There were no vitals filed for this visit.      Subjective Assessment - 03/21/16 0935    Subjective Pt denied falls or changes since last visit. Pt reports a little LBP discomfort but not painful.    Pertinent History Likes to be called "[name]"; HTN, Hx R foot drop,neuropathy B LE (beginning at knee)  L rotator cuff surgery 11/2014, gallbladder surgery 03/2015, hx of prostate CA approx. 10 years ago, mild incontinence   Patient Stated Goals Strengthening my legs and general overall strengthening   Currently in Pain?  No/denies         Therex: Pt performed strengthening HEP, please see pt instructions for details. Performed with S for safety.  Neuro re-ed: Pt performed balance exercises in corner with chair in front for safety, with cues for technique. Please see pt instructions for details. All balance exercises performed on non-compliant surface with feet apart and together.                        PT Education - 03/21/16 1244    Education provided Yes   Education Details PT provided pt with strengthening and balance HEP. PT asked pt to bring R AFO to next session to assess gait with AFO donned.   Person(s) Educated Patient   Methods Explanation;Demonstration;Tactile cues;Verbal cues;Handout   Comprehension Returned demonstration;Verbalized understanding          PT Short Term Goals - 03/14/16 1313    PT SHORT TERM GOAL #1   Title same as LTGs           PT Long Term Goals - 03/21/16 1247    PT LONG TERM GOAL #1   Title Pt will be IND In HEP to improve balance and strength. Target date: 04/11/16   Status On-going   PT LONG TERM GOAL #2   Title Pt will improve ABC score to >/=81% to improve confidence in balance  and reduce fear of falling. Target date: 04/11/16   Status On-going   PT LONG TERM GOAL #3   Title Perform 6 MWT and BERG and write goals if appropriate. Target date: 04/11/16   Status Achieved   PT LONG TERM GOAL #4   Title Pt will amb. 600' over even /uneven terrain at MOD I level to improve functional mobiity. Target date: 04/11/16   Status On-going   PT LONG TERM GOAL #5   Title Pt will traverse 12 steps with one handrail at MOD I level to navigate stairs at home safely. Target date: 04/11/16   Status On-going   PT LONG TERM GOAL #6   Title Pt will improve BERG score to >/=52/56 to decr. falls risk. Target date: 04/11/16   Status On-going   PT LONG TERM GOAL #7   Title Pt will improve 6MWT distance to >/=933' to improve endurance. Target date: 04/11/16    Status On-going               Plan - 03/21/16 1245    Clinical Impression Statement Pt continues to demonstrate posterior trunk lean, and hips in ant. direction during amb. and standing, this is likely 2/2 decr. hip ext strength. Pt required cues to improve posture, and PT will provide posture handout next session. Pt tolerated balance and strengthening HEP well. Continue with POC.    Rehab Potential Good   Clinical Impairments Affecting Rehab Potential co-morbidities   PT Frequency 2x / week   PT Duration 4 weeks   PT Treatment/Interventions ADLs/Self Care Home Management;Biofeedback;DME Instruction;Gait training;Stair training;Vestibular;Manual techniques;Functional mobility training;Therapeutic activities;Therapeutic exercise;Electrical Stimulation;Canalith Repostioning;Balance training;Neuromuscular re-education;Orthotic Fit/Training;Patient/family education   PT Next Visit Plan Add ankle eversion to HEP and provide posture handout. Pt supposed to bring AFO to next session (off the shelf).   PT Home Exercise Plan Balance and strengthening HEP   Consulted and Agree with Plan of Care Patient      Patient will benefit from skilled therapeutic intervention in order to improve the following deficits and impairments:  Abnormal gait, Decreased activity tolerance, Decreased strength, Decreased mobility, Decreased balance, Decreased range of motion, Pain, Decreased endurance (PT will continue to monitor pain but will not directly address.)  Visit Diagnosis: Muscle weakness (generalized)  Foot drop, right  Other abnormalities of gait and mobility     Problem List Patient Active Problem List   Diagnosis Date Noted  . Prostate cancer ([place]) 09/14/2012  . Abnormal gait 05/21/2012  . HTN (hypertension) 02/03/2012  . Neuropathy, leg 02/03/2012    Charlyne Robertshaw L 03/21/2016, 12:49 PM  [place] [address] [place], [place], [address] Phone: [phone]   Fax:  [phone]  Name: Travis Ellis MRN: [id number] Date of Birth: [birth year]    Geoffry Paradise, PT,DPT 03/21/2016 12:49 PM Phone: [phone] Fax: [phone]

## 2016-03-21 NOTE — Patient Instructions (Addendum)
Perform in a corner with chair in front of you for safety OR kitchen sink with chair in front of you:  Feet Together, Head Motion - Eyes Open    With eyes open, feet together, move head slowly: up and down 5 times and side to side 5 times. Repeat __3__ times per session. Do __1__ sessions per day.  Copyright  VHI. All rights reserved.  Feet Together, Varied Arm Positions - Eyes Closed    Stand with feet together and arms at your side. Close eyes and visualize upright position. Hold _30___ seconds. Repeat __3__ times per session. Do __1__ sessions per day.  Copyright  VHI. All rights reserved.    Ankle Bend (Dorsiflexion and Plantar Flexion)    Sitting or lying down, point toes up, keeping both heels on floor. Then press toes to floor, raising heels. Repeat __5__ times. Perform 3 sets. Do __1__ sessions per day.  http://gt2.exer.us/403   Copyright  VHI. All rights reserved.    Functional Quadriceps: Sit to Stand    Sit on edge of chair, feet flat on floor. Stand upright, extending knees fully. Repeat __10__ times per set. Do __2__ sets per session. Do _3-4___ sessions per week.  http://orth.exer.us/734   Copyright  VHI. All rights reserved.    Bridge    Lie back, legs bent. Tuck in stomach (belly button to spine) and press hips up towards ceiling, hold 2 seconds, then slowly lower hips back down. Repeat __10__ times. Perform 2 sets. Do __3-4__ sessions per week.  http://pm.exer.us/54   Copyright  VHI. All rights reserved.   Abduction: Clam (Eccentric) - Side-Lying    Lie on side with knees bent. Lift top knee, keeping feet together. Keep trunk steady and hips forward. Slowly lower back down. _10__ reps per set, _2__ sets per day, _3-4__ days per week. Repeat with the other leg.  http://ecce.exer.us/64   Copyright  VHI. All rights reserved.

## 2016-03-27 ENCOUNTER — Ambulatory Visit: Payer: BLUE CROSS/BLUE SHIELD | Admitting: Physical Therapy

## 2016-03-27 DIAGNOSIS — R2689 Other abnormalities of gait and mobility: Secondary | ICD-10-CM

## 2016-03-27 DIAGNOSIS — M21371 Foot drop, right foot: Secondary | ICD-10-CM

## 2016-03-27 DIAGNOSIS — M6281 Muscle weakness (generalized): Secondary | ICD-10-CM

## 2016-03-27 NOTE — Patient Instructions (Signed)
ANKLE: Eversion, Unilateral (Band)    Keeping heel in place, raise toes of banded foot up and away from body. Do not move hip or knee. Use yellow band if able. 5 reps for 2 sets, 3 times a day.  Copyright  VHI. All rights reserved.

## 2016-03-27 NOTE — Therapy (Signed)
[facility] [address] [place], [place], [address] Phone: [phone]   Fax:  [phone]  Physical Therapy Treatment  Patient Details  Name: Travis Ellis MRN: [id number] Date of Birth: 05/16/1956 Referring Provider: Philis Fendt, PA-C  Encounter Date: 03/27/2016      PT End of Session - 03/27/16 1030    Visit Number 4   Number of Visits 9   Date for PT Re-Evaluation 04/13/16   Authorization Type BCBS   PT Start Time [id number]   PT Stop Time 1020   PT Time Calculation (min) 44 min   Activity Tolerance Patient tolerated treatment well   Behavior During Therapy [facility] for tasks assessed/performed      Past Medical History:  Diagnosis Date  . Allergy   . Arthritis   . Cancer [facility]) 2008   prostate  . GERD (gastroesophageal reflux disease)   . Hyperlipidemia   . Hypertension   . Neuromuscular disorder ([place])   . Prostate cancer ([place])    pT2aN0Mx adenocarcinoma of the prostate    Past Surgical History:  Procedure Laterality Date  . CHOLECYSTECTOMY N/A 03/16/2015   Procedure: LAPAROSCOPIC CHOLECYSTECTOMY WITH INTRAOPERATIVE CHOLANGIOGRAM;  Surgeon: [name], MD;  Location: [facility];  Service: General;  Laterality: N/A;  . JOINT REPLACEMENT Right 08/2009   knee  . PROSTATECTOMY  12/2006   cancer  . REPLACEMENT TOTAL KNEE  2010   right knee    There were no vitals filed for this visit.      Subjective Assessment - 03/27/16 1028    Subjective Denies falls or changes.  Brought in his off the shelf AFO but never uses-"Its worthless.".   Pertinent History Likes to be called "[name]"; HTN, Hx R foot drop,neuropathy B LE (beginning at knee)  L rotator cuff surgery 11/2014, gallbladder surgery 03/2015, hx of prostate CA approx. 10 years ago, mild incontinence   Patient Stated Goals Strengthening my legs and general overall strengthening   Currently in Pain? No/denies              [facility] Adult PT  Treatment/Exercise - 03/27/16 0001      Knee/Hip Exercises: Aerobic   Recumbent Bike Scifit level 3.0 all 4 extremities x 8 minutes with rpm>60     Knee/Hip Exercises: Standing   Heel Raises Both   Heel Raises Limitations attempted but pt unable   Knee Flexion Both;15 reps;Other (comment)  red theraband for hamstring curl   Hip Flexion Both;15 reps;Knee bent;Other (comment)  red theraband   Hip Abduction Both;15 reps;Knee straight;Other (comment)  red theraband   Hip Extension Both;15 reps;Knee straight;Other (comment)  red theraband   Rocker Board 3 minutes;Other (comment)  both directions;unable to actively move board and needing UE     Ankle Exercises: Seated   Heel Raises 10 reps   Toe Raise 10 reps   Toe Raise Limitations able to perform on R with yellow theraband x 3 reps before fatigued and unable to complete further reps until rest   Other Seated Ankle Exercises R ankle eversion x 10 reps then x 4 reps with yellow theraband but fatigues quickly;provided as HEP and to use yellow band if able                PT Education - 03/27/16 1029    Education provided Yes   Education Details HEP for ankle eversion using band as strength allows   Person(s) Educated Patient   Methods Explanation;Demonstration;Verbal cues;Handout  Comprehension Verbalized understanding;Returned demonstration          PT Short Term Goals - 03/14/16 1313      PT SHORT TERM GOAL #1   Title same as LTGs           PT Long Term Goals - 03/21/16 1247      PT LONG TERM GOAL #1   Title Pt will be IND In HEP to improve balance and strength. Target date: 04/11/16   Status On-going     PT LONG TERM GOAL #2   Title Pt will improve ABC score to >/=81% to improve confidence in balance and reduce fear of falling. Target date: 04/11/16   Status On-going     PT LONG TERM GOAL #3   Title Perform 6 MWT and BERG and write goals if appropriate. Target date: 04/11/16   Status Achieved     PT LONG  TERM GOAL #4   Title Pt will amb. 600' over even /uneven terrain at MOD I level to improve functional mobiity. Target date: 04/11/16   Status On-going     PT LONG TERM GOAL #5   Title Pt will traverse 12 steps with one handrail at MOD I level to navigate stairs at home safely. Target date: 04/11/16   Status On-going     PT LONG TERM GOAL #6   Title Pt will improve BERG score to >/=52/56 to decr. falls risk. Target date: 04/11/16   Status On-going     PT LONG TERM GOAL #7   Title Pt will improve 6MWT distance to >/=933' to improve endurance. Target date: 04/11/16   Status On-going               Plan - 03/27/16 1030    Clinical Impression Statement Pt reports not wearing his off the shelf AFO due to difficulty driving with it and cant push on gas pedal.  Pt appears uninterested in pursuing another AFO but will notify primary PT if interested.  Pt might benefit from Foot Up brace as pt could unhook for driving.  Pt continues with posterior trunk lean and decrease abdominal muscle use/hangs out on hip flexors.  Continue PT per POC.   Rehab Potential Good   Clinical Impairments Affecting Rehab Potential co-morbidities   PT Frequency 2x / week   PT Duration 4 weeks   PT Treatment/Interventions ADLs/Self Care Home Management;Biofeedback;DME Instruction;Gait training;Stair training;Vestibular;Manual techniques;Functional mobility training;Therapeutic activities;Therapeutic exercise;Electrical Stimulation;Canalith Repostioning;Balance training;Neuromuscular re-education;Orthotic Fit/Training;Patient/family education   PT Next Visit Plan Provide posture handout. Try foot up brace if pt interested.  Core and LE strengthening.   PT Home Exercise Plan Balance and strengthening HEP   Consulted and Agree with Plan of Care Patient      Patient will benefit from skilled therapeutic intervention in order to improve the following deficits and impairments:  Abnormal gait, Decreased activity tolerance,  Decreased strength, Decreased mobility, Decreased balance, Decreased range of motion, Pain, Decreased endurance (PT will continue to monitor pain but will not directly address.)  Visit Diagnosis: Muscle weakness (generalized)  Foot drop, right  Other abnormalities of gait and mobility     Problem List Patient Active Problem List   Diagnosis Date Noted  . Prostate cancer ([place]) 09/14/2012  . Abnormal gait 05/21/2012  . HTN (hypertension) 02/03/2012  . Neuropathy, leg 02/03/2012    Narda Bonds 03/27/2016, 10:45 AM  [facility] [address] [place], [place], [address] Phone: [phone]   Fax:  [phone]  Name: JAQUES NADEL MRN: [id number] Date of Birth: Sep 27, 1955   Narda Bonds, [place] 03/27/16 10:45 AM Phone: [phone] Fax: [phone]

## 2016-03-29 ENCOUNTER — Ambulatory Visit: Payer: BLUE CROSS/BLUE SHIELD

## 2016-03-29 DIAGNOSIS — M21371 Foot drop, right foot: Secondary | ICD-10-CM | POA: Diagnosis not present

## 2016-03-29 DIAGNOSIS — R2689 Other abnormalities of gait and mobility: Secondary | ICD-10-CM

## 2016-03-29 DIAGNOSIS — M6281 Muscle weakness (generalized): Secondary | ICD-10-CM

## 2016-03-29 NOTE — Therapy (Addendum)
[facility] [address] [place], [place], [address] Phone: [phone]   Fax:  [phone]  Physical Therapy Treatment  Patient Details  Name: Travis Ellis MRN: [id number] Date of Birth: 08-19-1956 Referring Provider: Philis Fendt, PA-C  Encounter Date: 03/29/2016      PT End of Session - 03/29/16 1043    Visit Number 5   Number of Visits 9   Date for PT Re-Evaluation 04/13/16   Authorization Type BCBS   PT Start Time 0932   PT Stop Time 1012   PT Time Calculation (min) 40 min   Equipment Utilized During Treatment Gait belt   Activity Tolerance Patient tolerated treatment well   Behavior During Therapy [facility] for tasks assessed/performed      Past Medical History:  Diagnosis Date  . Allergy   . Arthritis   . Cancer [facility]) 2008   prostate  . GERD (gastroesophageal reflux disease)   . Hyperlipidemia   . Hypertension   . Neuromuscular disorder ([place])   . Prostate cancer ([place])    pT2aN0Mx adenocarcinoma of the prostate    Past Surgical History:  Procedure Laterality Date  . CHOLECYSTECTOMY N/A 03/16/2015   Procedure: LAPAROSCOPIC CHOLECYSTECTOMY WITH INTRAOPERATIVE CHOLANGIOGRAM;  Surgeon: [name], MD;  Location: [facility];  Service: General;  Laterality: N/A;  . JOINT REPLACEMENT Right 08/2009   knee  . PROSTATECTOMY  12/2006   cancer  . REPLACEMENT TOTAL KNEE  2010   right knee    There were no vitals filed for this visit.      Subjective Assessment - 03/29/16 0935    Subjective Pt denied falls or changes since last visit. Pt walked to end of block and back (about 8 minutes) and felt pretty good.    Pertinent History Likes to be called "[name]"; HTN, Hx R foot drop,neuropathy B LE (beginning at knee)  L rotator cuff surgery 11/2014, gallbladder surgery 03/2015, hx of prostate CA approx. 10 years ago, mild incontinence   Patient Stated Goals Strengthening my legs and general overall strengthening   Currently in Pain? Yes   Pain Score 1    Pain Location Back   Pain Orientation Lower   Pain Descriptors / Indicators Aching   Pain Type Chronic pain   Pain Onset More than a month ago   Pain Frequency Constant   Aggravating Factors  moving around   Pain Relieving Factors rest                         [facility] Adult PT Treatment/Exercise - 03/29/16 0951      Ambulation/Gait   Ambulation/Gait Yes   Ambulation/Gait Assistance 5: Supervision   Ambulation/Gait Assistance Details Pt amb. with and without R foot up brace donned and doffed.    Ambulation Distance (Feet) 230 Feet  c foot up brace donned and 117, 50'x2 no brace   Assistive device None   Gait Pattern Step-through pattern;Decreased stride length;Decreased dorsiflexion - right;Right steppage;Wide base of support;Poor foot clearance - right     Exercises   Exercises Knee/Hip     Knee/Hip Exercises: Machines for Strengthening   Cybex Leg Press 80 lbs. with BLEs x20 reps with cues to improve technique and eccentric control. Pt required incr. Cues as he had not used leg press prior to today.             Balance Exercises - 03/29/16 1036      Balance Exercises: Standing  Other Standing Exercises Pt performed ball toss standing on complian and non-compliant surfaces x50 reps with cues to improve posture. Performed to improve balance and core strength. Pt tried to toss ball overhead but was unable to 2/2 B shoulder pain. Performed with min guard to min A to maintain balance.  PT also utilized mirror and multi-modal cues to correct pt's posture (post. trunk lean).            PT Education - 03/29/16 1038    Education provided Yes   Education Details Educated pt on correct posture vs. posterior trunk lean, and asked pt to be more aware of posture throughout the day. Pt also provided pt with [place] foot-up brace information as it helped to decr. R foot drop.    Person(s) Educated Patient   Methods  Explanation;Handout;Demonstration   Comprehension Verbalized understanding;Returned demonstration          PT Short Term Goals - 03/14/16 1313      PT SHORT TERM GOAL #1   Title same as LTGs           PT Long Term Goals - 03/21/16 1247      PT LONG TERM GOAL #1   Title Pt will be IND In HEP to improve balance and strength. Target date: 04/11/16   Status On-going     PT LONG TERM GOAL #2   Title Pt will improve ABC score to >/=81% to improve confidence in balance and reduce fear of falling. Target date: 04/11/16   Status On-going     PT LONG TERM GOAL #3   Title Perform 6 MWT and BERG and write goals if appropriate. Target date: 04/11/16   Status Achieved     PT LONG TERM GOAL #4   Title Pt will amb. 600' over even /uneven terrain at MOD I level to improve functional mobiity. Target date: 04/11/16   Status On-going     PT LONG TERM GOAL #5   Title Pt will traverse 12 steps with one handrail at MOD I level to navigate stairs at home safely. Target date: 04/11/16   Status On-going     PT LONG TERM GOAL #6   Title Pt will improve BERG score to >/=52/56 to decr. falls risk. Target date: 04/11/16   Status On-going     PT LONG TERM GOAL #7   Title Pt will improve 6MWT distance to >/=933' to improve endurance. Target date: 04/11/16   Status On-going               Plan - 03/29/16 1043    Clinical Impression Statement Pt demonstrated progress, as he was able to correct posture with feedback from mirror and multi-modal cues from PT. However, pt noted to experience incr. postural sway when posture corrected. R foot drop decr. with foot-up brace donned, pt more agreeable to foot-up brace vs. AFOs. continue with POC.    Rehab Potential Good   Clinical Impairments Affecting Rehab Potential co-morbidities   PT Frequency 2x / week   PT Duration 4 weeks   PT Treatment/Interventions ADLs/Self Care Home Management;Biofeedback;DME Instruction;Gait training;Stair  training;Vestibular;Manual techniques;Functional mobility training;Therapeutic activities;Therapeutic exercise;Electrical Stimulation;Canalith Repostioning;Balance training;Neuromuscular re-education;Orthotic Fit/Training;Patient/family education   PT Next Visit Plan Core and LE strengthening.   PT Home Exercise Plan Balance and strengthening HEP   Consulted and Agree with Plan of Care Patient      Patient will benefit from skilled therapeutic intervention in order to improve the following deficits and impairments:  Abnormal gait,  Decreased activity tolerance, Decreased strength, Decreased mobility, Decreased balance, Decreased range of motion, Pain, Decreased endurance (PT will continue to monitor pain but will not directly address.)  Visit Diagnosis: Muscle weakness (generalized)  Foot drop, right  Other abnormalities of gait and mobility     Problem List Patient Active Problem List   Diagnosis Date Noted  . Prostate cancer ([place]) 09/14/2012  . Abnormal gait 05/21/2012  . HTN (hypertension) 02/03/2012  . Neuropathy, leg 02/03/2012    Renlee Floor L 03/29/2016, 10:46 AM  [facility] [address] [place], [place], [address] Phone: [phone]   Fax:  [phone]  Name: TATIANA LETTER MRN: [id number] Date of Birth: [birth year]   Geoffry Paradise, PT,DPT 03/29/16 10:46 AM Phone: [phone] Fax: [phone]

## 2016-04-03 ENCOUNTER — Ambulatory Visit: Payer: BLUE CROSS/BLUE SHIELD | Attending: Physician Assistant | Admitting: Physical Therapy

## 2016-04-03 DIAGNOSIS — R2689 Other abnormalities of gait and mobility: Secondary | ICD-10-CM | POA: Insufficient documentation

## 2016-04-03 DIAGNOSIS — M6281 Muscle weakness (generalized): Secondary | ICD-10-CM

## 2016-04-03 DIAGNOSIS — M21371 Foot drop, right foot: Secondary | ICD-10-CM | POA: Diagnosis present

## 2016-04-03 NOTE — Therapy (Signed)
[facility] [address] [place], [place], [address] Phone: [phone]   Fax:  [phone]  Physical Therapy Treatment  Patient Details  Name: Travis Ellis MRN: [id number] Date of Birth: 10/31/1955 Referring Provider: Philis Fendt, PA-C  Encounter Date: 04/03/2016      PT End of Session - 04/03/16 1022    Visit Number 6   Number of Visits 9   Date for PT Re-Evaluation 04/13/16   Authorization Type BCBS   PT Start Time [phone]   PT Stop Time 1015   PT Time Calculation (min) 38 min   Equipment Utilized During Treatment Gait belt   Activity Tolerance Patient tolerated treatment well   Behavior During Therapy [facility] for tasks assessed/performed      Past Medical History:  Diagnosis Date  . Allergy   . Arthritis   . Cancer [facility]) 2008   prostate  . GERD (gastroesophageal reflux disease)   . Hyperlipidemia   . Hypertension   . Neuromuscular disorder ([place])   . Prostate cancer ([place])    pT2aN0Mx adenocarcinoma of the prostate    Past Surgical History:  Procedure Laterality Date  . CHOLECYSTECTOMY N/A 03/16/2015   Procedure: LAPAROSCOPIC CHOLECYSTECTOMY WITH INTRAOPERATIVE CHOLANGIOGRAM;  Surgeon: [name], MD;  Location: [facility];  Service: General;  Laterality: N/A;  . JOINT REPLACEMENT Right 08/2009   knee  . PROSTATECTOMY  12/2006   cancer  . REPLACEMENT TOTAL KNEE  2010   right knee    There were no vitals filed for this visit.      Subjective Assessment - 04/03/16 0951    Subjective Denies falls or changes.   Pertinent History Likes to be called "[name]"; HTN, Hx R foot drop,neuropathy B LE (beginning at knee)  L rotator cuff surgery 11/2014, gallbladder surgery 03/2015, hx of prostate CA approx. 10 years ago, mild incontinence   Patient Stated Goals Strengthening my legs and general overall strengthening   Currently in Pain? No/denies          [facility] Adult PT Treatment/Exercise - 04/03/16 0001       Knee/Hip Exercises: Machines for Strengthening   Cybex Leg Press 80 lbs. with BLEs x10 reps then 85# x 10 reps with cues to improve technique and eccentric control.      Knee/Hip Exercises: Standing   Knee Flexion Both;15 reps;Other (comment)  4# on L and 3# on R   Hip Flexion Both;15 reps;Knee bent;Other (comment)  4# on L and 3# on R   Hip Abduction Both;15 reps;Knee straight;Other (comment)  4# weight on L and 3# on R   Hip Extension Both;15 reps;Knee straight;Other (comment)  4# on L and 3# on R   Other Standing Knee Exercises bil LE hamstring curl with 4# on L and 3# on R x 15 reps             Balance Exercises - 04/03/16 0952      Balance Exercises: Standing   Rockerboard Anterior/posterior;Lateral;Head turns;EO;Other time (comment);Intermittent UE support  2 minutes each direction;also rocking lateral   Other Standing Exercises Standing on blue foam with ball toss x 60 reps with min guard assist and verbal, visual and tactile cues             PT Short Term Goals - 03/14/16 1313      PT SHORT TERM GOAL #1   Title same as LTGs           PT Long Term Goals -  03/21/16 1247      PT LONG TERM GOAL #1   Title Pt will be IND In HEP to improve balance and strength. Target date: 04/11/16   Status On-going     PT LONG TERM GOAL #2   Title Pt will improve ABC score to >/=81% to improve confidence in balance and reduce fear of falling. Target date: 04/11/16   Status On-going     PT LONG TERM GOAL #3   Title Perform 6 MWT and BERG and write goals if appropriate. Target date: 04/11/16   Status Achieved     PT LONG TERM GOAL #4   Title Pt will amb. 600' over even /uneven terrain at MOD I level to improve functional mobiity. Target date: 04/11/16   Status On-going     PT LONG TERM GOAL #5   Title Pt will traverse 12 steps with one handrail at MOD I level to navigate stairs at home safely. Target date: 04/11/16   Status On-going     PT LONG TERM GOAL #6   Title  Pt will improve BERG score to >/=52/56 to decr. falls risk. Target date: 04/11/16   Status On-going     PT LONG TERM GOAL #7   Title Pt will improve 6MWT distance to >/=933' to improve endurance. Target date: 04/11/16   Status On-going               Plan - 04/03/16 1022    Clinical Impression Statement Pt continues with posterior trunk lean in standing, gait and with balance activities.  Pt did not appear interested in pursuing foot up brace.  Continue PT per POC.   Rehab Potential Good   Clinical Impairments Affecting Rehab Potential co-morbidities   PT Frequency 2x / week   PT Duration 4 weeks   PT Treatment/Interventions ADLs/Self Care Home Management;Biofeedback;DME Instruction;Gait training;Stair training;Vestibular;Manual techniques;Functional mobility training;Therapeutic activities;Therapeutic exercise;Electrical Stimulation;Canalith Repostioning;Balance training;Neuromuscular re-education;Orthotic Fit/Training;Patient/family education   PT Next Visit Plan Try seated on therapy ball and/or quadruped/modified quadruped for core strengthening.   PT Home Exercise Plan Balance and strengthening HEP   Consulted and Agree with Plan of Care Patient      Patient will benefit from skilled therapeutic intervention in order to improve the following deficits and impairments:  Abnormal gait, Decreased activity tolerance, Decreased strength, Decreased mobility, Decreased balance, Decreased range of motion, Pain, Decreased endurance (PT will continue to monitor pain but will not directly address.)  Visit Diagnosis: Muscle weakness (generalized)  Foot drop, right  Other abnormalities of gait and mobility     Problem List Patient Active Problem List   Diagnosis Date Noted  . Prostate cancer ([place]) 09/14/2012  . Abnormal gait 05/21/2012  . HTN (hypertension) 02/03/2012  . Neuropathy, leg 02/03/2012    Narda Bonds 04/03/2016, 10:25 AM  [facility] [address] [place], [place], [address] Phone: [phone]   Fax:  [phone]  Name: Travis Ellis MRN: [id number] Date of Birth: Sep 28, 1955   [place], [place] 04/03/16 10:25 AM Phone: [phone] Fax: [phone]

## 2016-04-05 ENCOUNTER — Ambulatory Visit: Payer: BLUE CROSS/BLUE SHIELD

## 2016-04-05 DIAGNOSIS — M21371 Foot drop, right foot: Secondary | ICD-10-CM

## 2016-04-05 DIAGNOSIS — M6281 Muscle weakness (generalized): Secondary | ICD-10-CM | POA: Diagnosis not present

## 2016-04-05 DIAGNOSIS — R2689 Other abnormalities of gait and mobility: Secondary | ICD-10-CM

## 2016-04-05 NOTE — Therapy (Signed)
[facility] [place] [address] [place], [place], [address] Phone: [phone]   Fax:  [phone]  Physical Therapy Treatment  Patient Details  Name: Travis Ellis MRN: [id number] Date of Birth: [birth year] Referring Provider: Philis Fendt, PA-C  Encounter Date: 04/05/2016      PT End of Session - 04/05/16 1203    Visit Number 7   Number of Visits 9   Date for PT Re-Evaluation 04/13/16   Authorization Type BCBS   PT Start Time 0932   PT Stop Time 1013   PT Time Calculation (min) 41 min   Equipment Utilized During Treatment --  min guard to S for safety   Activity Tolerance Patient tolerated treatment well   Behavior During Therapy [facility] for tasks assessed/performed      Past Medical History:  Diagnosis Date  . Allergy   . Arthritis   . Cancer [facility]) 2008   prostate  . GERD (gastroesophageal reflux disease)   . Hyperlipidemia   . Hypertension   . Neuromuscular disorder ([place])   . Prostate cancer ([place])    pT2aN0Mx adenocarcinoma of the prostate    Past Surgical History:  Procedure Laterality Date  . CHOLECYSTECTOMY N/A 03/16/2015   Procedure: LAPAROSCOPIC CHOLECYSTECTOMY WITH INTRAOPERATIVE CHOLANGIOGRAM;  Surgeon: [name], MD;  Location: [facility];  Service: General;  Laterality: N/A;  . JOINT REPLACEMENT Right 08/2009   knee  . PROSTATECTOMY  12/2006   cancer  . REPLACEMENT TOTAL KNEE  2010   right knee    There were no vitals filed for this visit.      Subjective Assessment - 04/05/16 0934    Subjective Pt denied falls but reported he feels more wobbly after washing car and going to lunch on Wednesday (sat on metal stools without backs for 2 hours).    Pertinent History Likes to be called "[name]"; HTN, Hx R foot drop,neuropathy B LE (beginning at knee)  L rotator cuff surgery 11/2014, gallbladder surgery 03/2015, hx of prostate CA approx. 10 years ago, mild incontinence   Patient Stated Goals  Strengthening my legs and general overall strengthening   Currently in Pain? No/denies                         [facility] Adult PT Treatment/Exercise - 04/05/16 0941      Knee/Hip Exercises: Aerobic   Recumbent Bike Scifit level 5.0 all 4 extremities x 8 minutes with rpm>60. Cues for technique. Distance: 0.11miles     Knee/Hip Exercises: Standing   Lateral Step Up 1 set;Both;10 reps;Hand Hold: 0;Step Height: 4"   Lateral Step Up Limitations Cues for technique and to fully extend knees during stance.   Forward Step Up Both;1 set;10 reps;Step Height: 4"   Forward Step Up Limitations Cues for technique and to fully extend knees.   Step Down Both;1 set;10 reps;Hand Hold: 0;Step Height: 4"   Step Down Limitations Cues to improve eccentric control.     Knee/Hip Exercises: Supine   Bridges Limitations Pt performed bridges with B LEs on physioball 3x10 reps with cues for technique and to engage abdominal muscles.   Other Supine Knee/Hip Exercises Pt performed quadruped activities with abdominal muscles engage (B shoulder flexion and then B LE hip/knee ext) x5 reps/extremity. Cues for technique and min guard for safety.   Other Supine Knee/Hip Exercises Pt performed DKTC with BLEs on physioball and abdominal muscles engaged 3x10reps with cues for technique and min guard to  keep ball in place.                PT Education - 04/05/16 1202    Education provided Yes   Education Details PT discussed the importance of performing heel raises in seated position and progressing to standing as tolerated, in order to improve B gastroc strength during gait.  PT educated pt on the importance of performing walking program, as pt reported he has not been performing very often.   Person(s) Educated Patient   Methods Explanation   Comprehension Verbalized understanding          PT Short Term Goals - 03/14/16 1313      PT SHORT TERM GOAL #1   Title same as LTGs           PT Long  Term Goals - 03/21/16 1247      PT LONG TERM GOAL #1   Title Pt will be IND In HEP to improve balance and strength. Target date: 04/11/16   Status On-going     PT LONG TERM GOAL #2   Title Pt will improve ABC score to >/=81% to improve confidence in balance and reduce fear of falling. Target date: 04/11/16   Status On-going     PT LONG TERM GOAL #3   Title Perform 6 MWT and BERG and write goals if appropriate. Target date: 04/11/16   Status Achieved     PT LONG TERM GOAL #4   Title Pt will amb. 600' over even /uneven terrain at MOD I level to improve functional mobiity. Target date: 04/11/16   Status On-going     PT LONG TERM GOAL #5   Title Pt will traverse 12 steps with one handrail at MOD I level to navigate stairs at home safely. Target date: 04/11/16   Status On-going     PT LONG TERM GOAL #6   Title Pt will improve BERG score to >/=52/56 to decr. falls risk. Target date: 04/11/16   Status On-going     PT LONG TERM GOAL #7   Title Pt will improve 6MWT distance to >/=933' to improve endurance. Target date: 04/11/16   Status On-going               Plan - 04/05/16 1204    Clinical Impression Statement Pt ambulates with B knees in slight flexion and posterior trunk lean, which is likely due to weak B gastroc musculature and weak abdominals and hip extensors. Pt tolerated strengthening well but did require seated rest breaks during step-ups 2/2 fatigue. Continue with POC.    Rehab Potential Good   Clinical Impairments Affecting Rehab Potential co-morbidities   PT Frequency 2x / week   PT Duration 4 weeks   PT Treatment/Interventions ADLs/Self Care Home Management;Biofeedback;DME Instruction;Gait training;Stair training;Vestibular;Manual techniques;Functional mobility training;Therapeutic activities;Therapeutic exercise;Electrical Stimulation;Canalith Repostioning;Balance training;Neuromuscular re-education;Orthotic Fit/Training;Patient/family education   PT Next Visit Plan  Begin to assess goals.   PT Home Exercise Plan Balance and strengthening HEP   Consulted and Agree with Plan of Care Patient      Patient will benefit from skilled therapeutic intervention in order to improve the following deficits and impairments:  Abnormal gait, Decreased activity tolerance, Decreased strength, Decreased mobility, Decreased balance, Decreased range of motion, Pain, Decreased endurance (PT will continue to monitor pain but will not directly address.)  Visit Diagnosis: Muscle weakness (generalized)  Foot drop, right  Other abnormalities of gait and mobility     Problem List Patient Active Problem List  Diagnosis Date Noted  . Prostate cancer ([place]) 09/14/2012  . Abnormal gait 05/21/2012  . HTN (hypertension) 02/03/2012  . Neuropathy, leg 02/03/2012    Jameya Pontiff L 04/05/2016, 12:09 PM  [place] [address] [place], [place], [address] Phone: [phone]   Fax:  [phone]  Name: Travis Ellis MRN: [id number] Date of Birth: 12/23/1955   Geoffry Paradise, PT,DPT 04/05/16 12:09 PM Phone: [phone] Fax: [phone]

## 2016-04-09 ENCOUNTER — Ambulatory Visit: Payer: BLUE CROSS/BLUE SHIELD

## 2016-04-09 DIAGNOSIS — R2689 Other abnormalities of gait and mobility: Secondary | ICD-10-CM

## 2016-04-09 DIAGNOSIS — M6281 Muscle weakness (generalized): Secondary | ICD-10-CM | POA: Diagnosis not present

## 2016-04-09 DIAGNOSIS — M21371 Foot drop, right foot: Secondary | ICD-10-CM

## 2016-04-09 NOTE — Patient Instructions (Addendum)
  Ankle Bend (Dorsiflexion and Plantar Flexion)    Sitting or lying down, point toes up, keeping both heels on floor. Then press toes to floor, raising heels. Repeat __5__ times. Perform 3 sets. Do __1__ sessions per day. Progress to 3 sets of 10 reps. http://gt2.exer.us/403   Copyright  VHI. All rights reserved.    Functional Quadriceps: Sit to Stand    Sit on edge of chair, feet flat on floor. Stand upright, extending knees fully. Repeat __10__ times per set. Do __2__ sets per session. Do _3-4___ sessions per week.  http://orth.exer.us/734   Copyright  VHI. All rights reserved.   [place] Knee Strengthener    Leaning on wall, bend knees and slowly lower body until legs form a 45 angle. Hold 5 seconds. Return to standing upright, while squeezing glute muscles. Repeat __5__ times. Do __3-4__ sessions per week.  http://gt2.exer.us/271   Copyright  VHI. All rights reserved.    Bridge    Lie back, legs bent. Tuck in stomach (belly button to spine) and press hips up towards ceiling, hold 2 seconds, then slowly lower hips back down. Repeat __10__ times. Perform 3 sets. Do __3-4__ sessions per week.  http://pm.exer.us/54   Copyright  VHI. All rights reserved.   Abduction: Clam (Eccentric) - Side-Lying    Lie on side with knees bent. Lift top knee, keeping feet together. Keep trunk steady and hips forward. Slowly lower back down. _10__ reps per set, _3__ sets per day, _3-4__ days per week. Repeat with the other leg.  http://ecce.exer.us/64 PROGRESSION: once 3 sets of 10 reps becomes easy, tie yellow theraband above knees and perform 3 sets of 10 reps.  Copyright  VHI. All rights reserved.   ANKLE: Eversion, Unilateral (Band)    Keeping heel in place, raise toes of banded foot up and away from body. Do not move hip or knee. Perform with both feet. 10 reps for 2 sets, 3-4 days a week. PROGRESSION: use yellow band around foot (as pictured above).  Copyright   VHI. All rights reserved.   Perform in a corner with chair in front of you for safety OR kitchen sink with chair in front of you:  Feet Together (Compliant Surface) Head Motion - Eyes Open    With eyes open, standing on compliant surface: __pillow/cushion_____, feet together, move head slowly: up and down 5 times and side to side 5 times. Repeat __3__ times per session. Do __1__ sessions per day.  Copyright  VHI. All rights reserved.  Feet Together (Compliant Surface) Varied Arm Positions - Eyes Closed    Stand on compliant surface: __pillow/cusion______ with feet together and arms at your side. Close eyes and visualize upright position. Hold__10-30__ seconds. Repeat __3__ times per session. Do __1__ sessions per day. Once balance activities become easy, you can perform 2 days a week for maintenance.  Copyright  VHI. All rights reserved.   PT progressed pt's balance HEP from feet together on non-compliant surface to compliant surfaces (see above exercises).

## 2016-04-09 NOTE — Therapy (Signed)
[place] [address] [place], [place], [address] Phone: [phone]   Fax:  [phone]  Physical Therapy Treatment  Patient Details  Name: Travis Ellis MRN: [id number] Date of Birth: 1956-07-07 Referring Provider: Philis Fendt, PA-C  Encounter Date: 04/09/2016      PT End of Session - 04/09/16 1309    Visit Number 8   Number of Visits 9   Date for PT Re-Evaluation 04/13/16   Authorization Type BCBS   PT Start Time 1103  pt late   PT Stop Time 1143   PT Time Calculation (min) 40 min   Activity Tolerance Patient tolerated treatment well   Behavior During Therapy [facility] for tasks assessed/performed      Past Medical History:  Diagnosis Date  . Allergy   . Arthritis   . Cancer [facility]) 2008   prostate  . GERD (gastroesophageal reflux disease)   . Hyperlipidemia   . Hypertension   . Neuromuscular disorder ([place])   . Prostate cancer ([place])    pT2aN0Mx adenocarcinoma of the prostate    Past Surgical History:  Procedure Laterality Date  . CHOLECYSTECTOMY N/A 03/16/2015   Procedure: LAPAROSCOPIC CHOLECYSTECTOMY WITH INTRAOPERATIVE CHOLANGIOGRAM;  Surgeon: [name], MD;  Location: [facility];  Service: General;  Laterality: N/A;  . JOINT REPLACEMENT Right 08/2009   knee  . PROSTATECTOMY  12/2006   cancer  . REPLACEMENT TOTAL KNEE  2010   right knee    There were no vitals filed for this visit.      Subjective Assessment - 04/09/16 1105    Subjective Pt denied falls or changes since last visit.    Pertinent History Likes to be called "[name]"; HTN, Hx R foot drop,neuropathy B LE (beginning at knee)  L rotator cuff surgery 11/2014, gallbladder surgery 03/2015, hx of prostate CA approx. 10 years ago, mild incontinence   Patient Stated Goals Strengthening my legs and general overall strengthening   Currently in Pain? No/denies         Therex and Neuro re-ed: Pt performed previous and progressed balance  and strengthening HEP, with cues for technique during new HEP. Performed with S for safety. Please see pt instructions for details.                         PT Education - 04/09/16 1308    Education provided Yes   Education Details PT progressed current balance and strengthening HEP and discussed d/c next session.   Person(s) Educated Patient   Methods Explanation;Demonstration;Verbal cues;Handout   Comprehension Returned demonstration;Verbalized understanding          PT Short Term Goals - 03/14/16 1313      PT SHORT TERM GOAL #1   Title same as LTGs           PT Long Term Goals - 04/09/16 1310      PT LONG TERM GOAL #1   Title Pt will be IND In HEP to improve balance and strength. Target date: 04/11/16   Status Achieved     PT LONG TERM GOAL #2   Title Pt will improve ABC score to >/=81% to improve confidence in balance and reduce fear of falling. Target date: 04/11/16   Status On-going     PT LONG TERM GOAL #3   Title Perform 6 MWT and BERG and write goals if appropriate. Target date: 04/11/16   Status Achieved     PT LONG TERM  GOAL #4   Title Pt will amb. 600' over even /uneven terrain at MOD I level to improve functional mobiity. Target date: 04/11/16   Status On-going     PT LONG TERM GOAL #5   Title Pt will traverse 12 steps with one handrail at MOD I level to navigate stairs at home safely. Target date: 04/11/16   Status On-going     PT LONG TERM GOAL #6   Title Pt will improve BERG score to >/=52/56 to decr. falls risk. Target date: 04/11/16   Status On-going     PT LONG TERM GOAL #7   Title Pt will improve 6MWT distance to >/=933' to improve endurance. Target date: 04/11/16   Status On-going               Plan - 04/09/16 1309    Clinical Impression Statement Pt met LTG 1. Pt demonstrated incr. strength, as he tolerated incr. reps during strengthening. Pt balance also improving as he tolerated progression during balance activities  from non-compliant to compliant surfaces. PT will assess remaining goals next session and d/c.    Rehab Potential Good   Clinical Impairments Affecting Rehab Potential co-morbidities   PT Frequency 2x / week   PT Duration 4 weeks   PT Treatment/Interventions ADLs/Self Care Home Management;Biofeedback;DME Instruction;Gait training;Stair training;Vestibular;Manual techniques;Functional mobility training;Therapeutic activities;Therapeutic exercise;Electrical Stimulation;Canalith Repostioning;Balance training;Neuromuscular re-education;Orthotic Fit/Training;Patient/family education   PT Next Visit Plan Assess goals and d/c.   PT Home Exercise Plan Balance and strengthening HEP   Consulted and Agree with Plan of Care Patient      Patient will benefit from skilled therapeutic intervention in order to improve the following deficits and impairments:  Abnormal gait, Decreased activity tolerance, Decreased strength, Decreased mobility, Decreased balance, Decreased range of motion, Pain, Decreased endurance (PT will continue to monitor pain but will not directly address.)  Visit Diagnosis: Muscle weakness (generalized)  Other abnormalities of gait and mobility  Foot drop, right     Problem List Patient Active Problem List   Diagnosis Date Noted  . Prostate cancer ([place]) 09/14/2012  . Abnormal gait 05/21/2012  . HTN (hypertension) 02/03/2012  . Neuropathy, leg 02/03/2012    Miller,Jennifer L 04/09/2016, 1:11 PM  [place] [address] [place], [place], [address] Phone: [phone]   Fax:  [phone]  Name: Travis Ellis MRN: [id number] Date of Birth: [birth year]   Geoffry Paradise, PT,DPT 04/09/16 1:11 PM Phone: [phone] Fax: [phone]

## 2016-04-11 ENCOUNTER — Ambulatory Visit: Payer: BLUE CROSS/BLUE SHIELD

## 2016-04-11 DIAGNOSIS — R2689 Other abnormalities of gait and mobility: Secondary | ICD-10-CM

## 2016-04-11 DIAGNOSIS — M6281 Muscle weakness (generalized): Secondary | ICD-10-CM | POA: Diagnosis not present

## 2016-04-11 DIAGNOSIS — M21371 Foot drop, right foot: Secondary | ICD-10-CM

## 2016-04-11 NOTE — Therapy (Signed)
[facility] [address] [place], [place], [address] Phone: [phone]   Fax:  [phone]  Physical Therapy Treatment  Patient Details  Name: Travis Ellis MRN: [id number] Date of Birth: 1956/08/06 Referring Provider: Philis Fendt, PA-C  Encounter Date: 04/11/2016      PT End of Session - 04/11/16 1142    Visit Number 9   Number of Visits 9   Date for PT Re-Evaluation 04/13/16   Authorization Type BCBS   PT Start Time 1103   PT Stop Time 5701  d/c   PT Time Calculation (min) 33 min   Equipment Utilized During Treatment --  min guard prn   Activity Tolerance Patient tolerated treatment well   Behavior During Therapy [facility] for tasks assessed/performed      Past Medical History:  Diagnosis Date  . Allergy   . Arthritis   . Cancer [facility]) 2008   prostate  . GERD (gastroesophageal reflux disease)   . Hyperlipidemia   . Hypertension   . Neuromuscular disorder ([place])   . Prostate cancer ([place])    [id number] adenocarcinoma of the prostate    Past Surgical History:  Procedure Laterality Date  . CHOLECYSTECTOMY N/A 03/16/2015   Procedure: LAPAROSCOPIC CHOLECYSTECTOMY WITH INTRAOPERATIVE CHOLANGIOGRAM;  Surgeon: [name], MD;  Location: [facility];  Service: General;  Laterality: N/A;  . JOINT REPLACEMENT Right 08/2009   knee  . PROSTATECTOMY  12/2006   cancer  . REPLACEMENT TOTAL KNEE  2010   right knee    There were no vitals filed for this visit.      Subjective Assessment - 04/11/16 1106    Subjective Pt reported he was moving an air conditioner and fell backwards yesterday. Pt denied pain, confusion, HA, weakness, or dizziness after fall.    Pertinent History Likes to be called "[name]"; HTN, Hx R foot drop,neuropathy B LE (beginning at knee)  L rotator cuff surgery 11/2014, gallbladder surgery 03/2015, hx of prostate CA approx. 10 years ago, mild incontinence   Patient Stated Goals Strengthening my legs  and general overall strengthening   Currently in Pain? No/denies            [facility] PT Assessment - 04/11/16 1117      6 Minute Walk- Baseline   6 Minute Walk- Baseline yes   Modified Borg Scale for Dyspnea 0- Nothing at all     6 Minute walk- Post Test   6 Minute Walk Post Test yes   BP (mmHg) 145/81   HR (bpm) 106   Modified Borg Scale for Dyspnea 2- Mild shortness of breath   Perceived Rate of Exertion (Borg) 9- very light     6 minute walk test results    Aerobic Endurance Distance Walked 1279   Endurance additional comments No AD, no rest breaks needed.                      [facility] Adult PT Treatment/Exercise - 04/11/16 1120      Ambulation/Gait   Ambulation/Gait Yes   Ambulation/Gait Assistance 6: Modified independent (Device/Increase time);5: Supervision   Ambulation/Gait Assistance Details MOD I level over even and paved surfaces (and gravel) but required min guard to S during one LOB over grassy terrain.    Ambulation Distance (Feet) 600 Feet   Assistive device None   Gait Pattern Step-through pattern;Decreased stride length;Decreased dorsiflexion - right;Right steppage;Wide base of support;Poor foot clearance - right;Decreased dorsiflexion - left   Ambulation  Surface Level;Unlevel;Indoor;Outdoor;Paved;Gravel;Grass   Stairs Yes   Stairs Assistance 6: Modified independent (Device/Increase time)   Stair Management Technique One rail Right;One rail Left;Alternating pattern;Forwards   Number of Stairs 12   Height of Stairs 6     Standardized Balance Assessment   Standardized Balance Assessment Berg Balance Test     Berg Balance Test   Sit to Stand Able to stand without using hands and stabilize independently   Standing Unsupported Able to stand safely 2 minutes   Sitting with Back Unsupported but Feet Supported on Floor or Stool Able to sit safely and securely 2 minutes   Stand to Sit Sits safely with minimal use of hands   Transfers Able to transfer  safely, minor use of hands   Standing Unsupported with Eyes Closed Able to stand 10 seconds safely   Standing Ubsupported with Feet Together Able to place feet together independently and stand 1 minute safely   From Standing, Reach Forward with Outstretched Arm Can reach forward >12 cm safely (5")   From Standing Position, Pick up Object from Floor Able to pick up shoe safely and easily   From Standing Position, Turn to Look Behind Over each Shoulder Looks behind from both sides and weight shifts well   Turn 360 Degrees Able to turn 360 degrees safely in 4 seconds or less   Standing Unsupported, Alternately Place Feet on Step/Stool Able to stand independently and complete 8 steps >20 seconds   Standing Unsupported, One Foot in Front Needs help to step but can hold 15 seconds   Standing on One Leg Able to lift leg independently and hold 5-10 seconds   Total Score 50      ABC filled out after PT session: 76.9%, scores closer to 100% indicate incr. Confidence in balance.          PT Education - 04/11/16 1141    Education provided Yes   Education Details PT discussed progress and outcome meausure results. PT discussed goals and d/c, pt agreeable.   Person(s) Educated Patient   Methods Explanation   Comprehension Verbalized understanding          PT Short Term Goals - 03/14/16 1313      PT SHORT TERM GOAL #1   Title same as LTGs           PT Long Term Goals - 04/11/16 1144      PT LONG TERM GOAL #1   Title Pt will be IND In HEP to improve balance and strength. Target date: 04/11/16   Status Achieved     PT LONG TERM GOAL #2   Title Pt will improve ABC score to >/=81% to improve confidence in balance and reduce fear of falling. Target date: 04/11/16   Status Partially Met     PT LONG TERM GOAL #3   Title Perform 6 MWT and BERG and write goals if appropriate. Target date: 04/11/16   Status Achieved     PT LONG TERM GOAL #4   Title Pt will amb. 600' over even /uneven  terrain at MOD I level to improve functional mobiity. Target date: 04/11/16   Status Partially Met     PT LONG TERM GOAL #5   Title Pt will traverse 12 steps with one handrail at MOD I level to navigate stairs at home safely. Target date: 04/11/16   Status Achieved     PT LONG TERM GOAL #6   Title Pt will improve BERG score to >/=52/56 to  decr. falls risk. Target date: 04/11/16   Status Partially Met     PT LONG TERM GOAL #7   Title Pt will improve 6MWT distance to >/=933' to improve endurance. Target date: 04/11/16   Status Achieved               Plan - 04/11/16 1142    Clinical Impression Statement Pt met LTGs 5 and 7, pt partially met LTGs 2, 4 and 6. Pt has met maximal functional progress during PT and therefore is discharged. Please see pt d/c summary for details.       Patient will benefit from skilled therapeutic intervention in order to improve the following deficits and impairments:     Visit Diagnosis: Other abnormalities of gait and mobility  Muscle weakness (generalized)  Foot drop, right     Problem List Patient Active Problem List   Diagnosis Date Noted  . Prostate cancer ([place]) 09/14/2012  . Abnormal gait 05/21/2012  . HTN (hypertension) 02/03/2012  . Neuropathy, leg 02/03/2012    Laquetta Racey L 04/11/2016, 11:49 AM  [facility] [address] [place], [place], [address] Phone: [phone]   Fax:  [phone]  Name: Travis Ellis MRN: [id number] Date of Birth: [birth year]  PHYSICAL THERAPY DISCHARGE SUMMARY  Visits from Start of Care: 9  Current functional level related to goals / functional outcomes:     PT Long Term Goals - 04/11/16 1144      PT LONG TERM GOAL #1   Title Pt will be IND In HEP to improve balance and strength. Target date: 04/11/16   Status Achieved     PT LONG TERM GOAL #2   Title Pt will improve ABC score to >/=81% to improve confidence in balance and  reduce fear of falling. Target date: 04/11/16   Status Partially Met     PT LONG TERM GOAL #3   Title Perform 6 MWT and BERG and write goals if appropriate. Target date: 04/11/16   Status Achieved     PT LONG TERM GOAL #4   Title Pt will amb. 600' over even /uneven terrain at MOD I level to improve functional mobiity. Target date: 04/11/16   Status Partially Met     PT LONG TERM GOAL #5   Title Pt will traverse 12 steps with one handrail at MOD I level to navigate stairs at home safely. Target date: 04/11/16   Status Achieved     PT LONG TERM GOAL #6   Title Pt will improve BERG score to >/=52/56 to decr. falls risk. Target date: 04/11/16   Status Partially Met     PT LONG TERM GOAL #7   Title Pt will improve 6MWT distance to >/=933' to improve endurance. Target date: 04/11/16   Status Achieved        Remaining deficits: Decr. B gastroc strength and impaired balance 2/2 weakness.  Education / Equipment: HEP and educated on foot up brace and pt's AFO.  Plan: Patient agrees to discharge.  Patient goals were partially met. Patient is being discharged due to meeting the stated rehab goals.  ?????       Geoffry Paradise, PT,DPT 04/11/16 11:49 AM Phone: [phone] Fax: [phone]

## 2016-07-12 ENCOUNTER — Other Ambulatory Visit: Payer: Self-pay | Admitting: Physician Assistant

## 2016-07-15 NOTE — Telephone Encounter (Signed)
03/2016 last ov and labs and refill

## 2016-07-24 IMAGING — CT CT ABD-PELV W/ CM
2 of 7 series · 16 of 46 positions shown, 18 images · IV contrast (omnipaque)
Comparison: Abdominal ultrasound performed earlier today at [DATE]
p.m., and CT of the abdomen and pelvis from 09/29/2014

CLINICAL DATA: Acute onset of generalized abdominal pain, with
nausea and vomiting. Initial encounter.

EXAM:
CT ABDOMEN AND PELVIS WITH CONTRAST
TECHNIQUE: Multidetector CT imaging of the abdomen and pelvis was performed
using the standard protocol following bolus administration of
intravenous contrast.
CONTRAST:  100mL OMNIPAQUE IOHEXOL 300 MG/ML  SOLN

[Series 2: abd/pel with · axial · 0.89mm/px · z∈[-337,+98]mm · 13 of 103 slices shown, 15 images]
[im 8/103  soft-tissue]
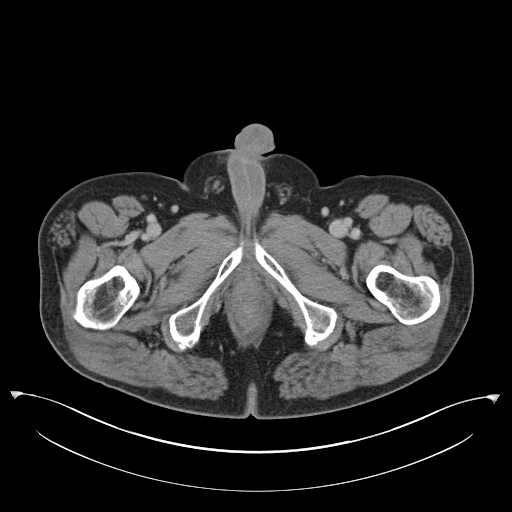
[im 8/103  bone]
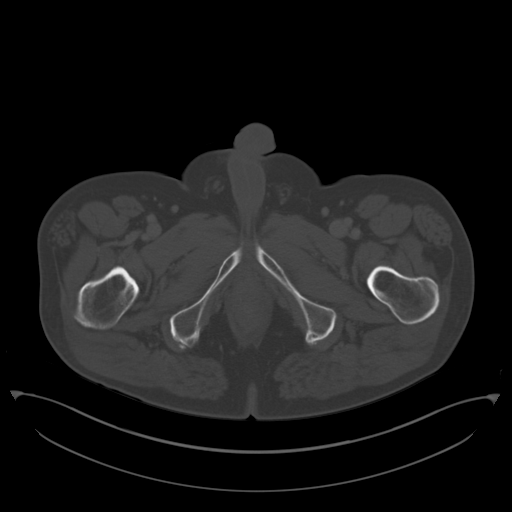
[im 15/103  soft-tissue]
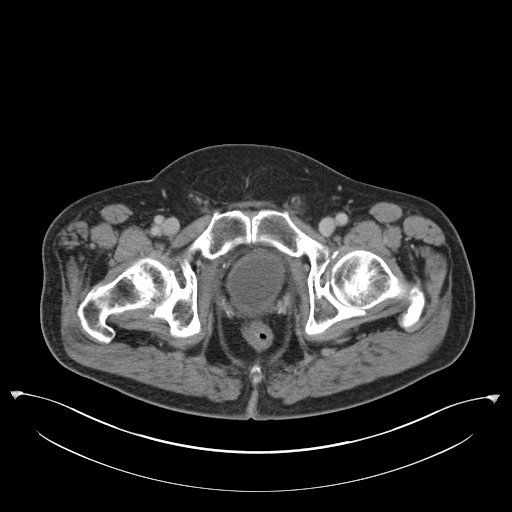
[im 22/103  soft-tissue]
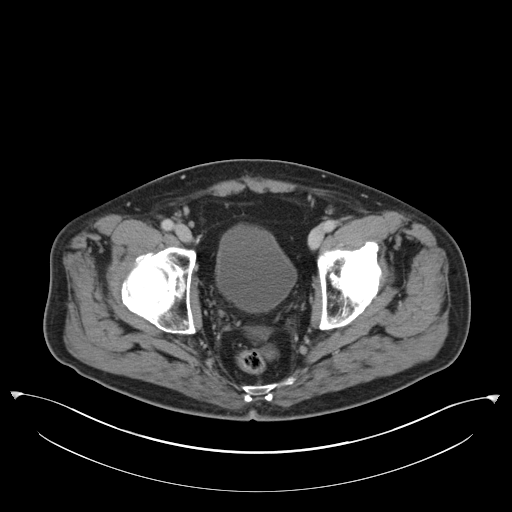
[im 30/103  soft-tissue]
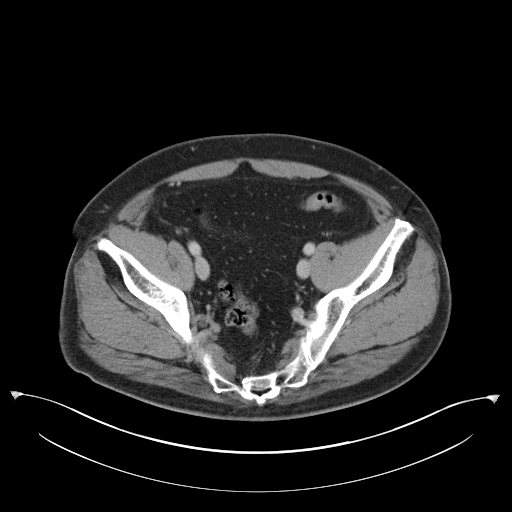
[im 37/103  soft-tissue]
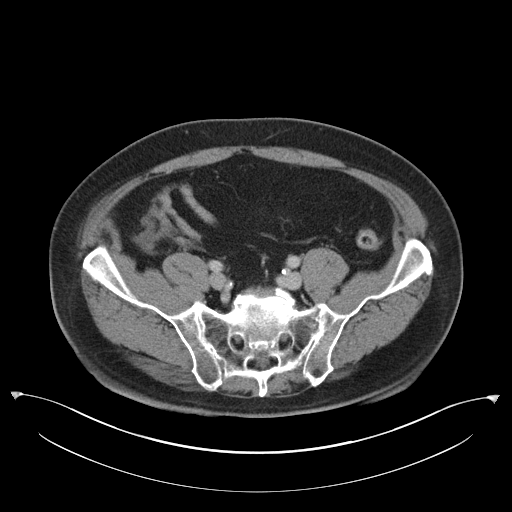
[im 44/103  soft-tissue]
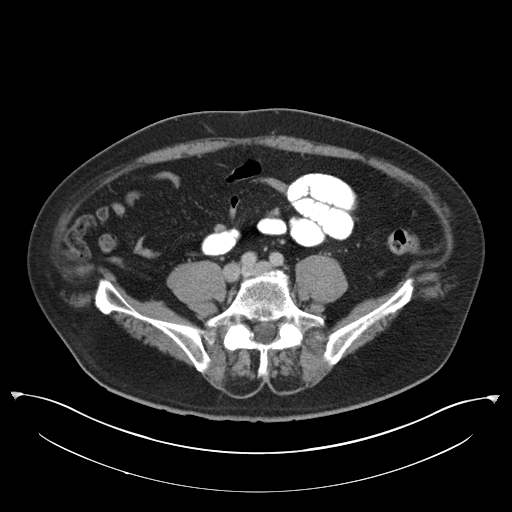
[im 52/103  soft-tissue]
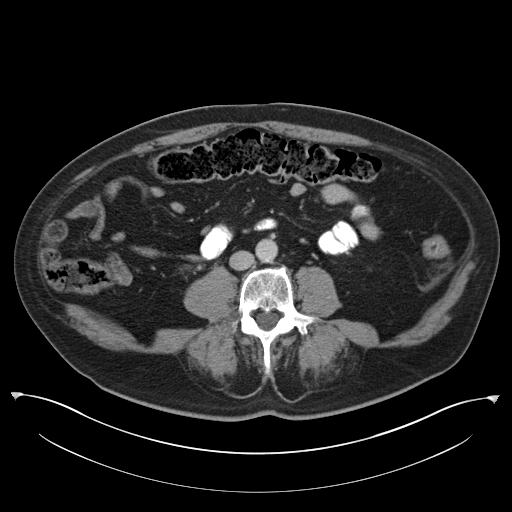
[im 59/103  soft-tissue]
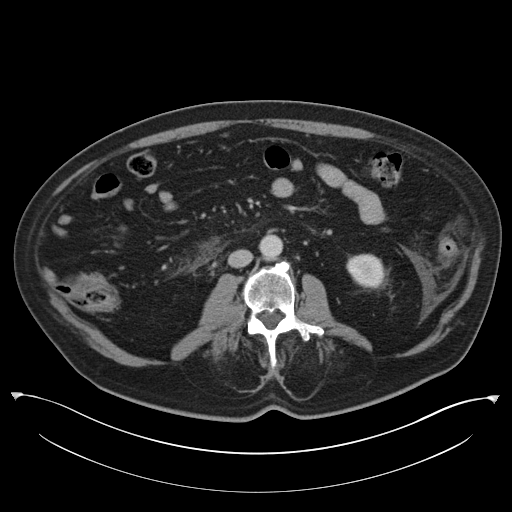
[im 66/103  soft-tissue]
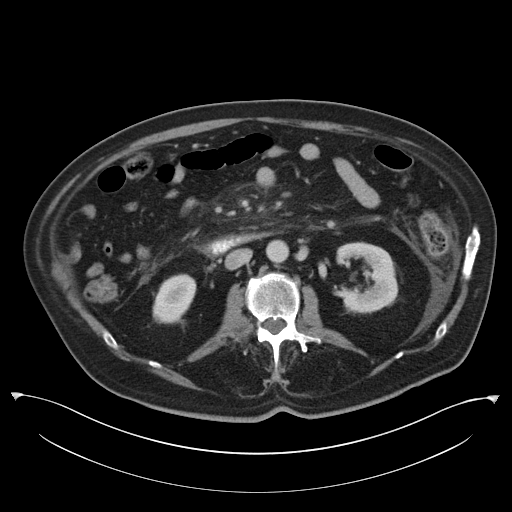
[im 66/103  bone]
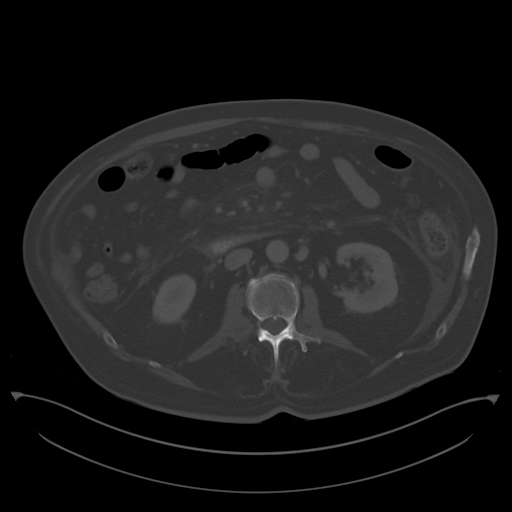
[im 73/103  soft-tissue]
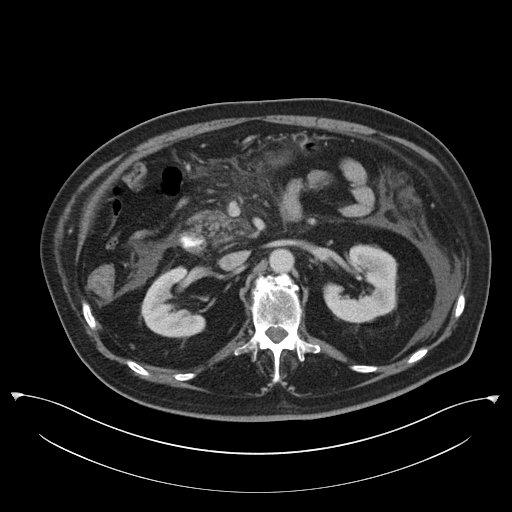
[im 81/103  soft-tissue]
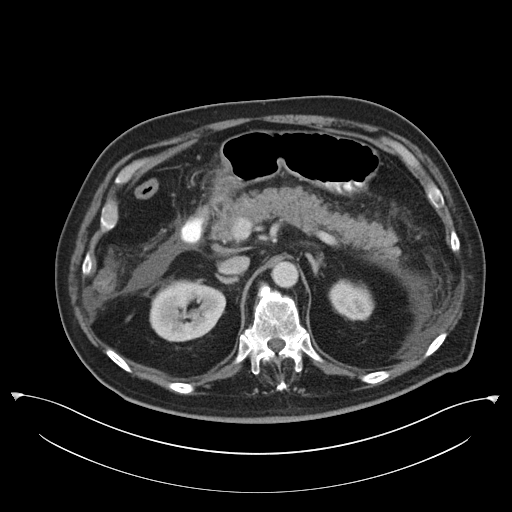
[im 88/103  soft-tissue]
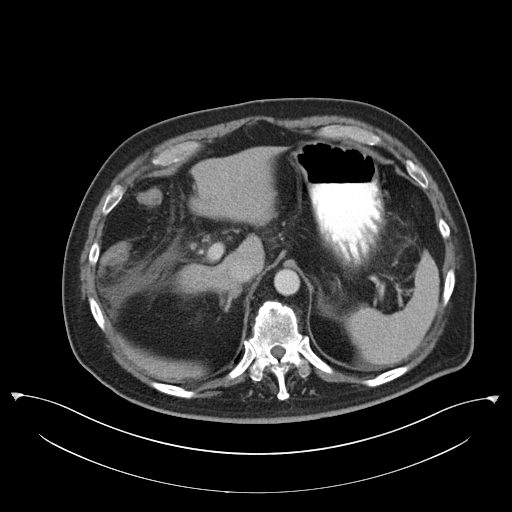
[im 95/103  soft-tissue]
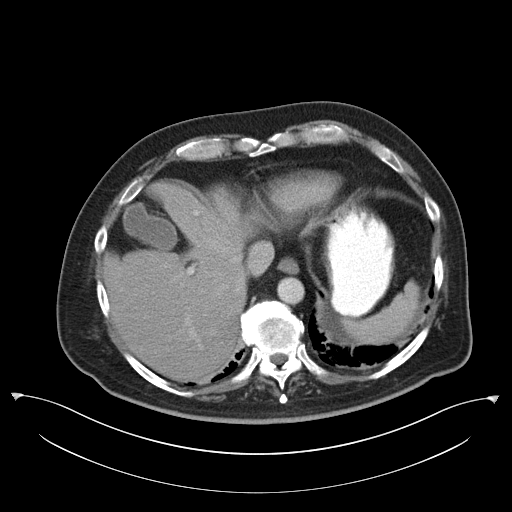

[Series 6: coronal a/|p · coronal · 0.86mm/px · 3 of 105 slices shown]
[im 35/105  soft-tissue]
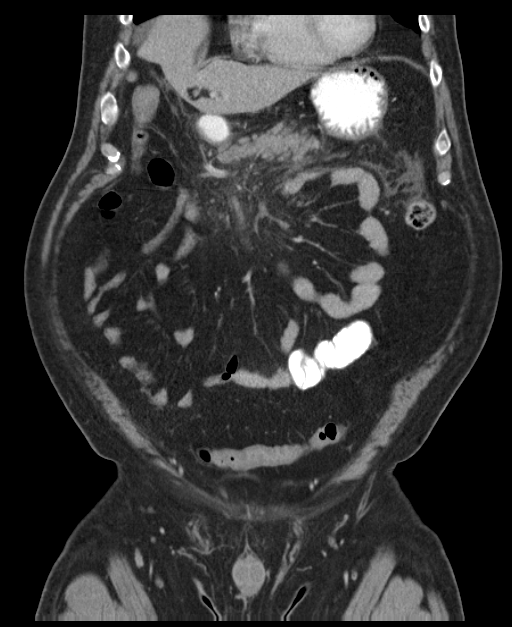
[im 47/105  soft-tissue]
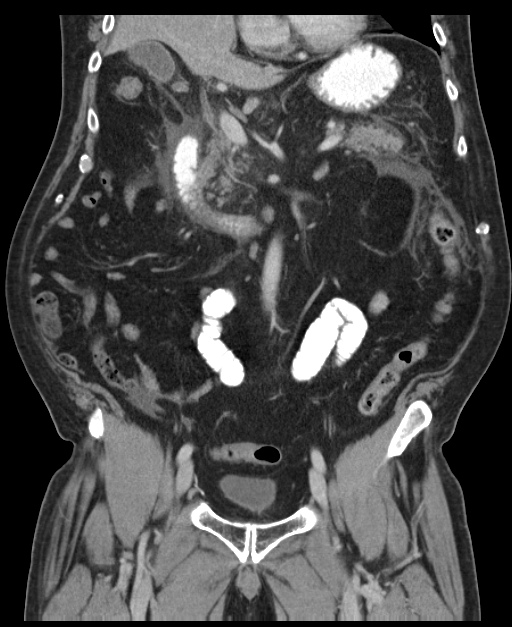
[im 58/105  soft-tissue]
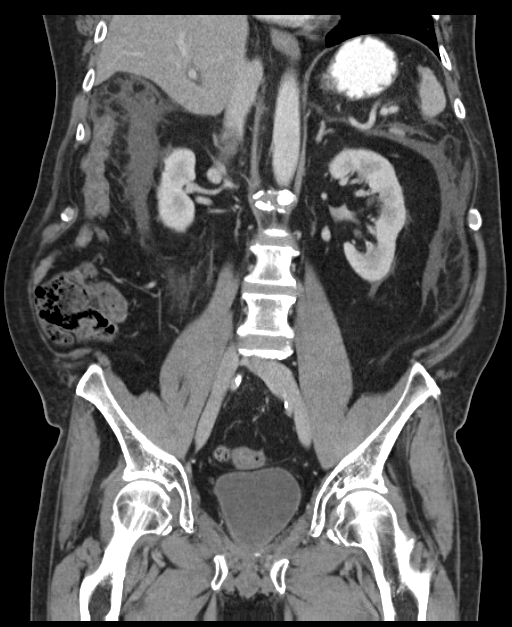

[16 of 46 positions shown; findings below may reference images not displayed]

FINDINGS: Mild bibasilar atelectasis or scarring is noted.

Mild soft inflammation is noted about the pancreas, with associated
free fluid tracking about the upper abdomen and along Gerota's
fascia bilaterally. Trace fluid extends into the pelvis. This is
most compatible with acute pancreatitis. There is no evidence of
devascularization or pseudocyst formation at this time.

The liver and spleen are unremarkable in appearance. The gallbladder
is within normal limits. The adrenal glands are unremarkable.

Nonspecific perinephric stranding is noted bilaterally. The kidneys
are otherwise unremarkable. There is no evidence of hydronephrosis.
No renal or ureteral stones are seen.

No free fluid is identified. The small bowel is unremarkable in
appearance. The stomach is within normal limits. No acute vascular
abnormalities are seen. Incidental note is made of a circumaortic
left renal vein.

The appendix is normal in caliber, without evidence of appendicitis.
The colon is largely decompressed and is unremarkable in appearance.

There is diffuse atrophy of the paraspinal musculature.

The bladder is mildly distended and grossly unremarkable. No
inguinal lymphadenopathy is seen.

No acute osseous abnormalities are identified. Anterior osteophytes
are seen along the lower thoracic and lumbar spine.
IMPRESSION: 1. Acute pancreatitis, with mild soft tissue inflammation about the
pancreas. Associated free fluid tracks about the upper abdomen and
along Gerota's fascia bilaterally. Trace fluid extends into the
pelvis. No evidence of devascularization or pseudocyst formation at
this time.
2. Diffuse atrophy of the paraspinal musculature.
3. Incidental note of a circumaortic left renal vein.
4. Mild bibasilar atelectasis noted.

## 2016-10-18 ENCOUNTER — Other Ambulatory Visit: Payer: Self-pay | Admitting: Physician Assistant

## 2016-10-18 DIAGNOSIS — E785 Hyperlipidemia, unspecified: Secondary | ICD-10-CM

## 2016-11-25 ENCOUNTER — Ambulatory Visit (INDEPENDENT_AMBULATORY_CARE_PROVIDER_SITE_OTHER): Payer: BLUE CROSS/BLUE SHIELD | Admitting: Physician Assistant

## 2016-11-25 ENCOUNTER — Encounter: Payer: Self-pay | Admitting: Physician Assistant

## 2016-11-25 VITALS — BP 124/72 | HR 82 | Temp 97.5°F | Resp 16 | Ht 72.0 in | Wt 216.0 lb

## 2016-11-25 DIAGNOSIS — Z131 Encounter for screening for diabetes mellitus: Secondary | ICD-10-CM

## 2016-11-25 DIAGNOSIS — R062 Wheezing: Secondary | ICD-10-CM

## 2016-11-25 DIAGNOSIS — Z13220 Encounter for screening for lipoid disorders: Secondary | ICD-10-CM

## 2016-11-25 DIAGNOSIS — I10 Essential (primary) hypertension: Secondary | ICD-10-CM | POA: Diagnosis not present

## 2016-11-25 DIAGNOSIS — Z130 Encounter for screening for diseases of the blood and blood-forming organs and certain disorders involving the immune mechanism: Secondary | ICD-10-CM | POA: Diagnosis not present

## 2016-11-25 DIAGNOSIS — Z1329 Encounter for screening for other suspected endocrine disorder: Secondary | ICD-10-CM | POA: Diagnosis not present

## 2016-11-25 DIAGNOSIS — Z1389 Encounter for screening for other disorder: Secondary | ICD-10-CM

## 2016-11-25 MED ORDER — ALBUTEROL SULFATE (2.5 MG/3ML) 0.083% IN NEBU
2.5000 mg | INHALATION_SOLUTION | Freq: Once | RESPIRATORY_TRACT | Status: AC
  Administered 2016-11-25: 2.5 mg via RESPIRATORY_TRACT

## 2016-11-25 MED ORDER — SPACER/AERO CHAMBER MOUTHPIECE MISC: 0 refills | Status: DC

## 2016-11-25 MED ORDER — ALBUTEROL SULFATE HFA 108 (90 BASE) MCG/ACT IN AERS: 1.0000 | INHALATION_SPRAY | RESPIRATORY_TRACT | 0 refills | Status: DC | PRN

## 2016-11-25 MED ORDER — LISINOPRIL-HYDROCHLOROTHIAZIDE 10-12.5 MG PO TABS: 1.0000 | ORAL_TABLET | Freq: Every day | ORAL | 1 refills | Status: DC

## 2016-11-25 MED ORDER — LISINOPRIL-HYDROCHLOROTHIAZIDE 10-12.5 MG PO TABS: 1.0000 | ORAL_TABLET | Freq: Every day | ORAL | 0 refills | Status: DC

## 2016-11-25 NOTE — Patient Instructions (Signed)
     IF you received an x-ray today, you will receive an invoice from [facility]. Please contact [facility] at [phone] with questions or concerns regarding your invoice.   IF you received labwork today, you will receive an invoice from [facility]. Please contact [facility] at [phone] with questions or concerns regarding your invoice.   Our billing staff will not be able to assist you with questions regarding bills from these companies.  You will be contacted with the lab results as soon as they are available. The fastest way to get your results is to activate your My Chart account. Instructions are located on the last page of this paperwork. If you have not heard from us regarding the results in 2 weeks, please contact this office.     

## 2016-11-25 NOTE — Progress Notes (Signed)
11/25/2016 9:29 PM   DOB: 09-07-1955 / MRN: [id number]  SUBJECTIVE:  Travis Ellis is a 61 y.o. male presenting for medication check.  He has been to physical therapy to get his conditioning back. He walks and exercises daily now and feels that he is somewhat better but still complains of some balance problems.  He did see a neurologist at [facility].  He tells me he was diagnosed with a hereditary peripheral neurology. He was due back for follow up but has decided to stay with his local orthopod.   He feels that he is eating well today and is sleeping good. Feels that he has adjusted well to retirement, misses the people but not management and the stress of work. 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   He complains of a persistent cough. This has been present for a few months now. No fevers, chills.  Has tried benzonatate from his wife and this did help him. No history of asthma. He has a history of GERD and reports he would have heartburn and mild nausea.    Immunization History  Administered Date(s) Administered  . Influenza [place] 09/06/2015  . Tdap 05/10/2015    Current Outpatient Prescriptions:  .  cetirizine (ZYRTEC) 10 MG tablet, Take 10 mg by mouth daily.  , Disp: , Rfl:  .  lisinopril-hydrochlorothiazide (PRINZIDE,ZESTORETIC) 10-12.5 MG tablet, Take 1 tablet by mouth daily., Disp: 90 tablet, Rfl: 1 .  Multiple Vitamins-Minerals (MULTIVITAMIN WITH MINERALS) tablet, Take 1 tablet by mouth daily.  , Disp: , Rfl:  .  simvastatin (ZOCOR) 20 MG tablet, Take 1 tablet (20 mg total) by mouth at bedtime., Disp: 90 tablet, Rfl: 3 .  simvastatin (ZOCOR) 20 MG tablet, TAKE 1 TABLET (20 MG TOTAL) BY MOUTH AT BEDTIME., Disp: 30 tablet, Rfl: 0 .  albuterol (PROVENTIL HFA;VENTOLIN HFA) 108 (90 Base) MCG/ACT inhaler, Inhale 1 puff into the lungs as needed for wheezing or shortness of breath. Take at 5 pm., Disp: 1 Inhaler, Rfl: 0 .  Spacer/Aero Chamber Mouthpiece MISC, Use with inhaler.  Ask  pharmacy how to use this with your inhaler., Disp: 1 each, Rfl: 0 .  zoster vaccine live, PF, (ZOSTAVAX) 16967 UNT/0.65ML injection, Inject 19,400 Units into the skin once. (Patient not taking: Reported on 11/25/2016), Disp: 1 each, Rfl: 0    He has No Known Allergies.   He  has a past medical history of Allergy; Arthritis; Cancer [facility]) (2008); GERD (gastroesophageal reflux disease); Hyperlipidemia; Hypertension; Neuromuscular disorder ([place]); and Prostate cancer ([place]).    He  reports that he has quit smoking. His smoking use included Cigars. He has never used smokeless tobacco. He reports that he drinks alcohol. He reports that he does not use drugs. He  reports that he currently engages in sexual activity. The patient  has a past surgical history that includes Replacement total knee (2010); Prostatectomy (12/2006); Joint replacement (Right, 08/2009); and Cholecystectomy (N/A, 03/16/2015).  His family history includes Breast cancer in his mother; COPD in his father; Cancer in his mother; Heart disease in his father and paternal grandfather; Hyperlipidemia in his paternal grandfather; Hypertension in his father.  Review of Systems  Constitutional: Negative for chills and fever.  Respiratory: Positive for cough. Negative for hemoptysis, sputum production, shortness of breath and wheezing.   Cardiovascular: Negative for chest pain and leg swelling.  Gastrointestinal: Negative for nausea.  Skin: Negative for itching and rash.    The problem list and medications were reviewed and updated by  myself where necessary and exist elsewhere in the encounter.   OBJECTIVE:  BP 124/72 (BP Location: Right Arm, Patient Position: Sitting, Cuff Size: Small)   Pulse 82   Temp 97.5 F (36.4 C) (Oral)   Resp 16   Ht 6' (1.829 m)   Wt 216 lb (98 kg)   SpO2 97%   BMI 29.29 kg/m   Pulse Readings from Last 3 Encounters:  11/25/16 82  03/06/16 (!) 104  09/06/15 100   BP Readings from Last 3 Encounters:   11/25/16 124/72  03/06/16 108/64  09/06/15 119/77   Lab Results  Component Value Date   CREATININE 0.95 03/06/2016   Lab Results  Component Value Date   CREATININE 0.95 03/06/2016   BUN 28 (H) 03/06/2016   NA 139 03/06/2016   K 3.8 03/06/2016   CL 104 03/06/2016   CO2 24 03/06/2016   Lab Results  Component Value Date   ALT 31 03/06/2016   AST 25 03/06/2016   ALKPHOS 48 03/06/2016   BILITOT 0.6 03/06/2016   Lab Results  Component Value Date   CHOL 140 03/06/2016   HDL 36 (L) 03/06/2016   LDLCALC 64 03/06/2016   TRIG 199 (H) 03/06/2016   CHOLHDL 3.9 03/06/2016    Lab Results  Component Value Date   HGBA1C 5.7 (H) 03/06/2016    Wt Readings from Last 3 Encounters:  11/25/16 216 lb (98 kg)  03/06/16 211 lb 3.2 oz (95.8 kg)  09/06/15 203 lb 12.8 oz (92.4 kg)         Physical Exam  Constitutional: He is oriented to person, place, and time. He appears well-developed and well-nourished. No distress.  Cardiovascular: Normal rate and regular rhythm.   Pulmonary/Chest: Effort normal. No respiratory distress. He has wheezes. He has no rales. He exhibits no tenderness.  Musculoskeletal: Normal range of motion. He exhibits no edema, tenderness or deformity.  Neurological: He is alert and oriented to person, place, and time. No cranial nerve deficit.  Skin: Skin is warm and dry. He is not diaphoretic.  Psychiatric: He has a normal mood and affect. His behavior is normal. Judgment and thought content normal.    No results found for this or any previous visit (from the past 72 hour(s)).  No results found.  ASSESSMENT AND PLAN:  Travis Ellis was seen today for follow-up.  Diagnoses and all orders for this visit:  Wheezing: Reversed with two applications of albuterol nebs.  Will try him on albuterol at home at 5 pm nightly, which is about an hour before his cough is bothering him the most.  He is not having GERD.  Does not complain of any overt allergy symptoms.  -      albuterol (PROVENTIL) (2.5 MG/3ML) 0.083% nebulizer solution 2.5 mg; Take 3 mLs (2.5 mg total) by nebulization once.       -     albuterol (PROVENTIL HFA;VENTOLIN HFA) 108 (90 Base) MCG/ACT inhaler; Inhale       1 puff into the lungs as needed for wheezing or shortness of breath. Take at 5 pm.  Screening for deficiency anemia -     CBC  Screening for nephropathy -     CMP14+EGFR  Screening for lipid disorders -     Lipid panel  Screening for thyroid disorder -     TSH  Screening for diabetes mellitus -     Hemoglobin A1c  Essential hypertension -     lisinopril-hydrochlorothiazide (PRINZIDE,ZESTORETIC) 10-12.5 MG tablet; Take 1  tablet by mouth daily.    The patient is advised to call or return to clinic if he does not see an improvement in symptoms, or to seek the care of the closest emergency department if he worsens with the above plan.   Philis Fendt, MHS, PA-C [facility] 11/25/2016 9:29 PM

## 2016-11-26 LAB — CBC
HEMATOCRIT: 48.4 % (ref 37.5–51.0)
Hemoglobin: 16.9 g/dL (ref 13.0–17.7)
MCH: 33.9 pg — ABNORMAL HIGH (ref 26.6–33.0)
MCHC: 34.9 g/dL (ref 31.5–35.7)
MCV: 97 fL (ref 79–97)
PLATELETS: 238 10*3/uL (ref 150–379)
RBC: 4.99 x10E6/uL (ref 4.14–5.80)
RDW: 12.5 % (ref 12.3–15.4)
WBC: 8.1 10*3/uL (ref 3.4–10.8)

## 2016-11-26 LAB — CMP14+EGFR
ALBUMIN: 4.4 g/dL (ref 3.6–4.8)
ALK PHOS: 51 IU/L (ref 39–117)
ALT: 36 IU/L (ref 0–44)
AST: 25 IU/L (ref 0–40)
Albumin/Globulin Ratio: 1.8 (ref 1.2–2.2)
BILIRUBIN TOTAL: 0.3 mg/dL (ref 0.0–1.2)
BUN / CREAT RATIO: 26 — AB (ref 10–24)
BUN: 19 mg/dL (ref 8–27)
CHLORIDE: 97 mmol/L (ref 96–106)
CO2: 24 mmol/L (ref 18–29)
CREATININE: 0.72 mg/dL — AB (ref 0.76–1.27)
Calcium: 9.5 mg/dL (ref 8.6–10.2)
GFR calc Af Amer: 116 mL/min/{1.73_m2} (ref >59)
GFR calc non Af Amer: 100 mL/min/{1.73_m2} (ref >59)
Globulin, Total: 2.5 g/dL (ref 1.5–4.5)
Glucose: 127 mg/dL — ABNORMAL HIGH (ref 65–99)
Potassium: 3.7 mmol/L (ref 3.5–5.2)
Sodium: 139 mmol/L (ref 134–144)
Total Protein: 6.9 g/dL (ref 6.0–8.5)

## 2016-11-26 LAB — LIPID PANEL
CHOL/HDL RATIO: 4.6 ratio (ref 0.0–5.0)
Cholesterol, Total: 151 mg/dL (ref 100–199)
HDL: 33 mg/dL — AB (ref >39)
LDL Calculated: 68 mg/dL (ref 0–99)
TRIGLYCERIDES: 248 mg/dL — AB (ref 0–149)
VLDL CHOLESTEROL CAL: 50 mg/dL — AB (ref 5–40)

## 2016-11-26 LAB — HEMOGLOBIN A1C
Est. average glucose Bld gHb Est-mCnc: 126 mg/dL
Hgb A1c MFr Bld: 6 % — ABNORMAL HIGH (ref 4.8–5.6)

## 2016-11-26 LAB — TSH: TSH: 1.72 u[IU]/mL (ref 0.450–4.500)

## 2016-11-27 ENCOUNTER — Encounter: Payer: Self-pay | Admitting: Physician Assistant

## 2016-11-27 ENCOUNTER — Other Ambulatory Visit: Payer: Self-pay | Admitting: Physician Assistant

## 2016-11-27 DIAGNOSIS — Z9189 Other specified personal risk factors, not elsewhere classified: Secondary | ICD-10-CM | POA: Insufficient documentation

## 2016-11-27 DIAGNOSIS — E785 Hyperlipidemia, unspecified: Secondary | ICD-10-CM

## 2016-11-27 MED ORDER — SIMVASTATIN 20 MG PO TABS: 20.0000 mg | ORAL_TABLET | Freq: Every day | ORAL | 3 refills | Status: DC

## 2016-11-30 ENCOUNTER — Encounter: Payer: Self-pay | Admitting: Physician Assistant

## 2016-12-26 ENCOUNTER — Encounter: Payer: Self-pay | Admitting: Physician Assistant

## 2016-12-26 ENCOUNTER — Other Ambulatory Visit: Payer: Self-pay | Admitting: Physician Assistant

## 2016-12-26 MED ORDER — BENZONATATE 100 MG PO CAPS: 100.0000 mg | ORAL_CAPSULE | Freq: Two times a day (BID) | ORAL | 0 refills | Status: DC | PRN

## 2017-02-18 LAB — HM DIABETES EYE EXAM

## 2017-05-22 ENCOUNTER — Other Ambulatory Visit: Payer: Self-pay | Admitting: Physician Assistant

## 2017-08-18 ENCOUNTER — Other Ambulatory Visit: Payer: Self-pay | Admitting: Physician Assistant

## 2017-08-19 NOTE — Telephone Encounter (Signed)
Needs OV before refill

## 2017-08-27 ENCOUNTER — Telehealth: Payer: Self-pay | Admitting: Physician Assistant

## 2017-08-27 ENCOUNTER — Ambulatory Visit (INDEPENDENT_AMBULATORY_CARE_PROVIDER_SITE_OTHER): Payer: BLUE CROSS/BLUE SHIELD | Admitting: Physician Assistant

## 2017-08-27 ENCOUNTER — Encounter: Payer: Self-pay | Admitting: Physician Assistant

## 2017-08-27 VITALS — BP 112/74 | HR 94 | Resp 18 | Ht 71.65 in | Wt 216.4 lb

## 2017-08-27 DIAGNOSIS — Z1321 Encounter for screening for nutritional disorder: Secondary | ICD-10-CM

## 2017-08-27 DIAGNOSIS — R718 Other abnormality of red blood cells: Secondary | ICD-10-CM | POA: Diagnosis not present

## 2017-08-27 DIAGNOSIS — L719 Rosacea, unspecified: Secondary | ICD-10-CM | POA: Diagnosis not present

## 2017-08-27 DIAGNOSIS — Z23 Encounter for immunization: Secondary | ICD-10-CM | POA: Diagnosis not present

## 2017-08-27 DIAGNOSIS — Z13228 Encounter for screening for other metabolic disorders: Secondary | ICD-10-CM | POA: Diagnosis not present

## 2017-08-27 DIAGNOSIS — Z1329 Encounter for screening for other suspected endocrine disorder: Secondary | ICD-10-CM | POA: Diagnosis not present

## 2017-08-27 DIAGNOSIS — Z130 Encounter for screening for diseases of the blood and blood-forming organs and certain disorders involving the immune mechanism: Secondary | ICD-10-CM

## 2017-08-27 DIAGNOSIS — Z0000 Encounter for general adult medical examination without abnormal findings: Secondary | ICD-10-CM

## 2017-08-27 DIAGNOSIS — I10 Essential (primary) hypertension: Secondary | ICD-10-CM | POA: Diagnosis not present

## 2017-08-27 DIAGNOSIS — E785 Hyperlipidemia, unspecified: Secondary | ICD-10-CM | POA: Diagnosis not present

## 2017-08-27 LAB — POCT URINALYSIS DIP (MANUAL ENTRY)
BILIRUBIN UA: NEGATIVE mg/dL
Bilirubin, UA: NEGATIVE
Blood, UA: NEGATIVE
GLUCOSE UA: NEGATIVE mg/dL
LEUKOCYTES UA: NEGATIVE
Nitrite, UA: NEGATIVE
Protein Ur, POC: NEGATIVE mg/dL
Spec Grav, UA: 1.02 (ref 1.010–1.025)
Urobilinogen, UA: 0.2 E.U./dL
pH, UA: 7 (ref 5.0–8.0)

## 2017-08-27 MED ORDER — METRONIDAZOLE 1 % EX GEL: Freq: Every day | CUTANEOUS | 3 refills | Status: DC

## 2017-08-27 MED ORDER — LISINOPRIL-HYDROCHLOROTHIAZIDE 10-12.5 MG PO TABS: 1.0000 | ORAL_TABLET | Freq: Every day | ORAL | 3 refills | Status: DC

## 2017-08-27 MED ORDER — ZOSTER VAC RECOMB ADJUVANTED 50 MCG/0.5ML IM SUSR: 0.5000 mL | Freq: Once | INTRAMUSCULAR | 1 refills | Status: AC

## 2017-08-27 MED ORDER — SIMVASTATIN 20 MG PO TABS: 20.0000 mg | ORAL_TABLET | Freq: Every day | ORAL | 3 refills | Status: DC

## 2017-08-27 NOTE — Progress Notes (Signed)
08/27/2017 4:42 PM   DOB: 11/24/1955 / MRN: [id number]  SUBJECTIVE:  Travis Ellis is a 61 y.o. male presenting for annual exam.  He tells me he is feeling well overall today.  He takes medication as prescribed.   Has a history of prostate cancer and tells me that his PSA has been creeping up despite s/p radical prostectomy in mid 2008.  He is managed by urology for this. He notes having increased "bladder leakage" and has been advised to perform kegles often.   He tends to walks for exercise.  He was also able to do some snow shoveling.   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 He did find his younger brother dead in his bed.   Immunization History  Administered Date(s) Administered  . Influenza Split 09/06/2015  . Tdap 05/10/2015     He has No Known Allergies.   He  has a past medical history of Allergy, Arthritis, Cancer ([place]) (2008), GERD (gastroesophageal reflux disease), Hyperlipidemia, Hypertension, Neuromuscular disorder ([place]), and Prostate cancer ([place]).    He  reports that he has quit smoking. His smoking use included cigars. he has never used smokeless tobacco. He reports that he drinks alcohol. He reports that he does not use drugs. He  reports that he currently engages in sexual activity. The patient  has a past surgical history that includes Replacement total knee (2010); Prostatectomy (12/2006); Joint replacement (Right, 08/2009); and Cholecystectomy (N/A, 03/16/2015).  His family history includes Breast cancer in his mother; COPD in his father; Cancer in his mother; Heart disease in his father and paternal grandfather; Hyperlipidemia in his paternal grandfather; Hypertension in his father.  Review of Systems  Constitutional: Negative for chills, diaphoresis and fever.  Eyes: Negative.   Cardiovascular: Negative for chest pain.  Gastrointestinal: Negative for abdominal pain, blood in stool, constipation, diarrhea,  heartburn, melena, nausea and vomiting.  Genitourinary: Negative for flank pain.  Skin: Negative for rash.  Neurological: Negative for dizziness, sensory change, speech change, focal weakness and headaches.    The problem list and medications were reviewed and updated by myself where necessary and exist elsewhere in the encounter.   OBJECTIVE:  BP 112/74 (BP Location: Left Arm, Patient Position: Sitting, Cuff Size: Normal)   Pulse 94   Resp 18   Ht 5' 11.65" (1.82 m)   Wt 216 lb 6.4 oz (98.2 kg)   SpO2 98%   BMI 29.63 kg/m   Physical Exam  Constitutional: He is oriented to person, place, and time. He appears well-developed. He is active and cooperative.  Non-toxic appearance.  Eyes: EOM are normal. Pupils are equal, round, and reactive to light.  Cardiovascular: Normal rate, regular rhythm, S1 normal, S2 normal, normal heart sounds, intact distal pulses and normal pulses. Exam reveals no gallop and no friction rub.  No murmur heard. Pulmonary/Chest: Effort normal. No stridor. No tachypnea. No respiratory distress. He has no wheezes. He has no rales.  Abdominal: He exhibits no distension.  Musculoskeletal: He exhibits no edema.  Neurological: He is alert and oriented to person, place, and time. He has normal strength and normal reflexes. He is not disoriented. No cranial nerve deficit or sensory deficit. He exhibits normal muscle tone. Coordination and gait normal.  Skin: Skin is warm and dry. He is not diaphoretic. No pallor.  Psychiatric: His behavior is normal.  Vitals reviewed.   Lab Results  Component Value Date  WBC 7.7 08/27/2017   HGB 17.4 (H) 08/27/2017   HCT 49.9 08/27/2017   MCV 95.8 08/27/2017   PLT 252 08/27/2017    Lab Results  Component Value Date   CREATININE 0.51 (L) 08/27/2017   BUN 22 08/27/2017   NA 137 08/27/2017   K 3.8 08/27/2017   CL 100 08/27/2017   CO2 28 08/27/2017    Lab Results  Component Value Date   ALT 38 08/27/2017   AST 25  08/27/2017   ALKPHOS 51 11/25/2016   BILITOT 0.7 08/27/2017    Lab Results  Component Value Date   TSH 1.16 08/27/2017    Lab Results  Component Value Date   HGBA1C 6.0 (H) 11/25/2016    Lab Results  Component Value Date   CHOL 147 08/27/2017   HDL 40 (L) 08/27/2017   LDLCALC 68 11/25/2016   TRIG 156 (H) 08/27/2017   CHOLHDL 3.7 08/27/2017   Results for orders placed or performed in visit on 08/27/17 (from the past 72 hour(s))  POCT urinalysis dipstick     Status: None   Collection Time: 08/27/17 10:40 AM  Result Value Ref Range   Color, UA yellow yellow   Clarity, UA clear clear   Glucose, UA negative negative mg/dL   Bilirubin, UA negative negative   Ketones, POC UA negative negative mg/dL   Spec Grav, UA 1.020 1.010 - 1.025   Blood, UA negative negative   pH, UA 7.0 5.0 - 8.0   Protein Ur, POC negative negative mg/dL   Urobilinogen, UA 0.2 0.2 or 1.0 E.U./dL   Nitrite, UA Negative Negative   Leukocytes, UA Negative Negative  CBC with Differential/Platelet     Status: Abnormal   Collection Time: 08/27/17 10:45 AM  Result Value Ref Range   WBC 7.7 3.8 - 10.8 Thousand/uL   RBC 5.21 4.20 - 5.80 Million/uL   Hemoglobin 17.4 (H) 13.2 - 17.1 g/dL   HCT 49.9 38.5 - 50.0 %   MCV 95.8 80.0 - 100.0 fL   MCH 33.4 (H) 27.0 - 33.0 pg   MCHC 34.9 32.0 - 36.0 g/dL   RDW 11.4 11.0 - 15.0 %   Platelets 252 140 - 400 Thousand/uL   MPV 10.9 7.5 - 12.5 fL   Neutro Abs 5,806 1,500 - 7,800 cells/uL   Lymphs Abs 1,078 850 - 3,900 cells/uL   WBC mixed population 678 200 - 950 cells/uL   Eosinophils Absolute 77 15 - 500 cells/uL   Basophils Absolute 62 0 - 200 cells/uL   Neutrophils Relative % 75.4 %   Total Lymphocyte 14.0 %   Monocytes Relative 8.8 %   Eosinophils Relative 1.0 %   Basophils Relative 0.8 %  Lipid panel     Status: Abnormal   Collection Time: 08/27/17 10:45 AM  Result Value Ref Range   Cholesterol 147 <200 mg/dL   HDL 40 (L) >40 mg/dL   Triglycerides 156  (H) <150 mg/dL   LDL Cholesterol (Calc) 81 mg/dL (calc)    Comment: Reference range: <100 . Desirable range <100 mg/dL for primary prevention;   <70 mg/dL for patients with CHD or diabetic patients  with > or = 2 CHD risk factors. Marland Kitchen LDL-C is now calculated using the Martin-Hopkins  calculation, which is a validated novel method providing  better accuracy than the Friedewald equation in the  estimation of LDL-C.  Cresenciano Genre et al. Annamaria Helling. 5329;924(26): 2061-2068  (http://education.[facility].com/faq/FAQ164)    Total CHOL/HDL Ratio 3.7 <5.0 (calc)  Non-HDL Cholesterol (Calc) 107 <130 mg/dL (calc)    Comment: For patients with diabetes plus 1 major ASCVD risk  factor, treating to a non-HDL-C goal of <100 mg/dL  (LDL-C of <70 mg/dL) is considered a therapeutic  option.   TSH     Status: None   Collection Time: 08/27/17 10:45 AM  Result Value Ref Range   TSH 1.16 0.40 - 4.50 mIU/L  Hepatic function panel     Status: None   Collection Time: 08/27/17 10:45 AM  Result Value Ref Range   Total Protein 6.7 6.1 - 8.1 g/dL   Albumin 4.3 3.6 - 5.1 g/dL   Globulin 2.4 1.9 - 3.7 g/dL (calc)   AG Ratio 1.8 1.0 - 2.5 (calc)   Total Bilirubin 0.7 0.2 - 1.2 mg/dL   Bilirubin, Direct 0.2 0.0 - 0.2 mg/dL   Indirect Bilirubin 0.5 0.2 - 1.2 mg/dL (calc)   Alkaline phosphatase (APISO) 47 40 - 115 U/L   AST 25 10 - 35 U/L   ALT 38 9 - 46 U/L  Basic metabolic panel     Status: Abnormal   Collection Time: 08/27/17 10:45 AM  Result Value Ref Range   Glucose, Bld 127 (H) 65 - 99 mg/dL    Comment: .            Fasting reference interval . For someone without known diabetes, a glucose value >125 mg/dL indicates that they may have diabetes and this should be confirmed with a follow-up test. .    BUN 22 7 - 25 mg/dL   Creat 0.51 (L) 0.70 - 1.25 mg/dL    Comment: For patients >58 years of age, the reference limit for Creatinine is approximately 13% higher for people identified as  African-American. .    BUN/Creatinine Ratio 43 (H) 6 - 22 (calc)   Sodium 137 135 - 146 mmol/L   Potassium 3.8 3.5 - 5.3 mmol/L   Chloride 100 98 - 110 mmol/L   CO2 28 20 - 32 mmol/L   Calcium 9.8 8.6 - 10.3 mg/dL    No results found.  ASSESSMENT AND PLAN:   Kedrick was seen today for annual exam.  Diagnoses and all orders for this visit:  Annual physical exam  Screening for endocrine, nutritional, metabolic and immunity disorder -     Cancel: CBC -     Cancel: Lipid panel -     Cancel: TSH -     Cancel: Hemoglobin A1c -     Cancel: Basic metabolic panel -     Cancel: Hepatic function panel -     POCT urinalysis dipstick -     CBC with Differential/Platelet -     Lipid panel -     TSH -     Hemoglobin A1c -     Basic metabolic panel; Future -     Hepatic function panel -     Basic metabolic panel  Rosacea -     metroNIDAZOLE (METROGEL) 1 % gel; Apply topically daily. Apply a small amount daily. Okay to skip in times when the rash is doing well.  Dyslipidemia -     simvastatin (ZOCOR) 20 MG tablet; Take 1 tablet (20 mg total) by mouth at bedtime.  Hypertension, well controlled -     lisinopril-hydrochlorothiazide (PRINZIDE,ZESTORETIC) 10-12.5 MG tablet; Take 1 tablet by mouth daily.  Need for shingles vaccine -     Zoster Vaccine Adjuvanted [facility]) injection; Inject 0.5 mLs into the muscle once for 1  dose. Due for booster in 6 months.  Needs flu shot -     Flu Vaccine QUAD 36+ mos IM    The patient is advised to call or return to clinic if he does not see an improvement in symptoms, or to seek the care of the closest emergency department if he worsens with the above plan.   Philis Fendt, MHS, PA-C [facility] 08/27/2017 4:42 PM

## 2017-08-27 NOTE — Patient Instructions (Signed)
     IF you received an x-ray today, you will receive an invoice from [facility]. Please contact [facility] at [phone] with questions or concerns regarding your invoice.   IF you received labwork today, you will receive an invoice from [facility]. Please contact [facility] at [phone] with questions or concerns regarding your invoice.   Our billing staff will not be able to assist you with questions regarding bills from these companies.  You will be contacted with the lab results as soon as they are available. The fastest way to get your results is to activate your My Chart account. Instructions are located on the last page of this paperwork. If you have not heard from us regarding the results in 2 weeks, please contact this office.     

## 2017-08-27 NOTE — Telephone Encounter (Signed)
Copied from [place] [id number]. Topic: Inquiry >> Aug 27, 2017  2:10 PM Conception Chancy, NT wrote: Reason for CRM: patient states he was seen today for a physical and states Carlis Abbott was supposed to call him in some cough medicine benzoate. Pt just left pharmacy and states it was not in his list to pick up. Please advise

## 2017-08-28 ENCOUNTER — Encounter: Payer: Self-pay | Admitting: Physician Assistant

## 2017-08-28 ENCOUNTER — Other Ambulatory Visit: Payer: Self-pay | Admitting: Physician Assistant

## 2017-08-28 DIAGNOSIS — R799 Abnormal finding of blood chemistry, unspecified: Secondary | ICD-10-CM

## 2017-08-28 LAB — CBC WITH DIFFERENTIAL/PLATELET
BASOS ABS: 62 {cells}/uL (ref 0–200)
Basophils Relative: 0.8 %
EOS PCT: 1 %
Eosinophils Absolute: 77 cells/uL (ref 15–500)
HEMATOCRIT: 49.9 % (ref 38.5–50.0)
HEMOGLOBIN: 17.4 g/dL — AB (ref 13.2–17.1)
LYMPHS ABS: 1078 {cells}/uL (ref 850–3900)
MCH: 33.4 pg — ABNORMAL HIGH (ref 27.0–33.0)
MCHC: 34.9 g/dL (ref 32.0–36.0)
MCV: 95.8 fL (ref 80.0–100.0)
MPV: 10.9 fL (ref 7.5–12.5)
Monocytes Relative: 8.8 %
NEUTROS ABS: 5806 {cells}/uL (ref 1500–7800)
NEUTROS PCT: 75.4 %
Platelets: 252 10*3/uL (ref 140–400)
RBC: 5.21 10*6/uL (ref 4.20–5.80)
RDW: 11.4 % (ref 11.0–15.0)
Total Lymphocyte: 14 %
WBC mixed population: 678 cells/uL (ref 200–950)
WBC: 7.7 10*3/uL (ref 3.8–10.8)

## 2017-08-28 LAB — LIPID PANEL
CHOL/HDL RATIO: 3.7 (calc) (ref <5.0)
Cholesterol: 147 mg/dL (ref <200)
HDL: 40 mg/dL — AB (ref >40)
LDL Cholesterol (Calc): 81 mg/dL (calc)
NON-HDL CHOLESTEROL (CALC): 107 mg/dL (ref <130)
TRIGLYCERIDES: 156 mg/dL — AB (ref <150)

## 2017-08-28 LAB — HEPATIC FUNCTION PANEL
AG Ratio: 1.8 (calc) (ref 1.0–2.5)
ALBUMIN MSPROF: 4.3 g/dL (ref 3.6–5.1)
ALT: 38 U/L (ref 9–46)
AST: 25 U/L (ref 10–35)
Alkaline phosphatase (APISO): 47 U/L (ref 40–115)
BILIRUBIN DIRECT: 0.2 mg/dL (ref 0.0–0.2)
BILIRUBIN INDIRECT: 0.5 mg/dL (ref 0.2–1.2)
BILIRUBIN TOTAL: 0.7 mg/dL (ref 0.2–1.2)
GLOBULIN: 2.4 g/dL (ref 1.9–3.7)
Total Protein: 6.7 g/dL (ref 6.1–8.1)

## 2017-08-28 LAB — BASIC METABOLIC PANEL
BUN / CREAT RATIO: 43 (calc) — AB (ref 6–22)
BUN: 22 mg/dL (ref 7–25)
CALCIUM: 9.8 mg/dL (ref 8.6–10.3)
CHLORIDE: 100 mmol/L (ref 98–110)
CO2: 28 mmol/L (ref 20–32)
Creat: 0.51 mg/dL — ABNORMAL LOW (ref 0.70–1.25)
Glucose, Bld: 127 mg/dL — ABNORMAL HIGH (ref 65–99)
Potassium: 3.8 mmol/L (ref 3.5–5.3)
SODIUM: 137 mmol/L (ref 135–146)

## 2017-08-28 LAB — HEMOGLOBIN A1C
EAG (MMOL/L): 7.1 (calc)
Hgb A1c MFr Bld: 6.1 % of total Hgb — ABNORMAL HIGH (ref <5.7)
MEAN PLASMA GLUCOSE: 128 (calc)

## 2017-08-28 LAB — PATHOLOGIST SMEAR REVIEW

## 2017-08-28 LAB — TEST AUTHORIZATION

## 2017-08-28 LAB — TSH: TSH: 1.16 m[IU]/L (ref 0.40–4.50)

## 2017-08-28 MED ORDER — BENZONATATE 200 MG PO CAPS: 200.0000 mg | ORAL_CAPSULE | Freq: Two times a day (BID) | ORAL | 1 refills | Status: DC | PRN

## 2017-08-28 NOTE — Progress Notes (Unsigned)
b

## 2017-08-28 NOTE — Progress Notes (Signed)
BUN is mildly elevated. Please advise that he come back in about two weeks for a repeat renal panel only.  Please advise that he be very well hydrated at that time.

## 2017-08-28 NOTE — Addendum Note (Signed)
Addended by: Gari Crown D on: 08/28/2017 08:29 AM   Modules accepted: Orders

## 2017-08-29 LAB — CBC

## 2017-08-29 LAB — BASIC METABOLIC PANEL

## 2017-08-29 LAB — LIPID PANEL

## 2017-08-29 LAB — TSH

## 2017-08-29 LAB — HEPATIC FUNCTION PANEL

## 2017-08-29 LAB — HEMOGLOBIN A1C

## 2017-08-29 NOTE — Telephone Encounter (Signed)
It was sent in yesterday by Philis Fendt PA

## 2017-10-29 ENCOUNTER — Ambulatory Visit: Payer: BLUE CROSS/BLUE SHIELD

## 2017-10-29 DIAGNOSIS — R799 Abnormal finding of blood chemistry, unspecified: Secondary | ICD-10-CM

## 2017-10-30 LAB — RENAL FUNCTION PANEL
Albumin: 4 g/dL (ref 3.6–5.1)
BUN: 24 mg/dL (ref 7–25)
CALCIUM: 9.6 mg/dL (ref 8.6–10.3)
CHLORIDE: 98 mmol/L (ref 98–110)
CO2: 29 mmol/L (ref 20–32)
CREATININE: 0.81 mg/dL (ref 0.70–1.25)
GLUCOSE: 176 mg/dL — AB (ref 65–99)
PHOSPHORUS: 3.1 mg/dL (ref 2.5–4.5)
Potassium: 3.8 mmol/L (ref 3.5–5.3)
Sodium: 137 mmol/L (ref 135–146)

## 2018-02-23 ENCOUNTER — Ambulatory Visit: Payer: BLUE CROSS/BLUE SHIELD | Admitting: Physician Assistant

## 2018-02-23 ENCOUNTER — Other Ambulatory Visit: Payer: Self-pay

## 2018-02-23 ENCOUNTER — Encounter: Payer: Self-pay | Admitting: Physician Assistant

## 2018-02-23 VITALS — BP 120/75 | HR 98 | Temp 98.0°F | Resp 18 | Ht 71.0 in | Wt 218.8 lb

## 2018-02-23 DIAGNOSIS — Z9189 Other specified personal risk factors, not elsewhere classified: Secondary | ICD-10-CM

## 2018-02-23 DIAGNOSIS — I10 Essential (primary) hypertension: Secondary | ICD-10-CM | POA: Diagnosis not present

## 2018-02-23 DIAGNOSIS — R7303 Prediabetes: Secondary | ICD-10-CM

## 2018-02-23 MED ORDER — ASPIRIN EC 81 MG PO TBEC: 81.0000 mg | DELAYED_RELEASE_TABLET | Freq: Every day | ORAL | 0 refills | Status: AC

## 2018-02-23 NOTE — Progress Notes (Signed)
The 10-year ASCVD risk score Mikey Bussing DC Brooke Bonito., et al., 2013) is: 15%   Values used to calculate the score:     Age: 62 years     Sex: Male     Is Non-Hispanic African American: No     Diabetic: No     Tobacco smoker: Yes     Systolic Blood Pressure: 753 mmHg     Is BP treated: Yes     HDL Cholesterol: 40 mg/dL     Total Cholesterol: 147 mg/dL

## 2018-02-23 NOTE — Patient Instructions (Addendum)
Exercise improves every system in the body.  It lowers the risk of heart disease, decreases blood pressure, reduces the symptoms of depression and anxiety, and lowers blood sugar. To receive these benefits, try to get 150 minutes of planned exercise each week.  You can break this 150 minutes up however you like.  For instance, you can perform 30 minutes of brisk walking 5 days a week, or perform 50 minutes 3 days a week.  If you don't like walking, or can't find a safe place to walk, find another way to move that you can enjoy.  Exercise tapes, cycling, stair climbing, swimming, or a combination will be just as good as a walking program. To ensure the proper intensity, you can use the talk test. Essentially, you should be able to carry on a conversation, but you should have to take short breaks from the conversation in order catch your breath.     IF you received an x-ray today, you will receive an invoice from [facility]. Please contact [facility] at [phone] with questions or concerns regarding your invoice.   IF you received labwork today, you will receive an invoice from [facility]. Please contact [facility] at [phone] with questions or concerns regarding your invoice.   Our billing staff will not be able to assist you with questions regarding bills from these companies.  You will be contacted with the lab results as soon as they are available. The fastest way to get your results is to activate your My Chart account. Instructions are located on the last page of this paperwork. If you have not heard from us regarding the results in 2 weeks, please contact this office.     

## 2018-02-23 NOTE — Progress Notes (Signed)
02/23/2018 10:10 AM   DOB: 06/14/1956 / MRN: [id number]  SUBJECTIVE:  Travis Ellis is a 62 y.o. male presenting for recheck hypertension. Symptoms present for years.  The problem is controlled with medicine. He has tried Ace along with diuretic.  He feels well today and has no complaints.  Taking his statin.  Patient had stopped taking aspirin.  He wonders if he should start back.  ASCVD score indicates risk of 11.6 and I have advised patient to continue with ASA.  He is not getting as much exercise as he would like.  Is to have a musculoskeletal disorder which causes weakness in the lower extremities bilaterally.  Has been seen by [facility] neurology and was advised that nothing could be done aside from exercise  Immunization History  Administered Date(s) Administered  . Influenza Split 09/06/2015  . Tdap 05/10/2015    Current Outpatient Medications:  .  lisinopril-hydrochlorothiazide (PRINZIDE,ZESTORETIC) 10-12.5 MG tablet, Take 1 tablet by mouth daily., Disp: 90 tablet, Rfl: 3 .  metroNIDAZOLE (METROGEL) 1 % gel, Apply topically daily. Apply a small amount daily. Okay to skip in times when the rash is doing well., Disp: 45 g, Rfl: 3 .  simvastatin (ZOCOR) 20 MG tablet, Take 1 tablet (20 mg total) by mouth at bedtime., Disp: 90 tablet, Rfl: 3 .  aspirin EC 81 MG tablet, Take 1 tablet (81 mg total) by mouth daily., Disp: 365 tablet, Rfl: 0   He has No Known Allergies.   He  has a past medical history of Allergy, Arthritis, Cancer ([place]) (2008), GERD (gastroesophageal reflux disease), Hyperlipidemia, Hypertension, Neuromuscular disorder ([place]), and Prostate cancer ([place]).    He  reports that he has never smoked. He has never used smokeless tobacco. He reports that he drinks alcohol. He reports that he does not use drugs. He  reports that he currently engages in sexual activity. The patient  has a past surgical history that includes Replacement total knee (2010); Prostatectomy (12/2006);  Joint replacement (Right, 08/2009); and Cholecystectomy (N/A, 03/16/2015).  His family history includes Breast cancer in his mother; COPD in his father; Cancer in his mother; Heart disease in his father and paternal grandfather; Hyperlipidemia in his paternal grandfather; Hypertension in his father.  Review of Systems  Constitutional: Negative for chills, diaphoresis and fever.  Eyes: Negative.   Respiratory: Negative for cough, hemoptysis, sputum production, shortness of breath and wheezing.   Cardiovascular: Negative for chest pain, orthopnea and leg swelling.  Gastrointestinal: Negative for abdominal pain, blood in stool, constipation, diarrhea, heartburn, melena, nausea and vomiting.  Genitourinary: Negative for dysuria, flank pain, frequency, hematuria and urgency.  Skin: Negative for rash.  Neurological: Negative for dizziness, sensory change, speech change, focal weakness and headaches.    The problem list and medications were reviewed and updated by myself where necessary and exist elsewhere in the encounter.   OBJECTIVE:  BP 120/75   Pulse 98   Temp 98 F (36.7 C) (Oral)   Resp 18   Ht 5\' 11"  (1.803 m)   Wt 218 lb 12.8 oz (99.2 kg)   SpO2 96%   BMI 30.52 kg/m   Wt Readings from Last 3 Encounters:  02/23/18 218 lb 12.8 oz (99.2 kg)  08/27/17 216 lb 6.4 oz (98.2 kg)  11/25/16 216 lb (98 kg)   Temp Readings from Last 3 Encounters:  02/23/18 98 F (36.7 C) (Oral)  11/25/16 97.5 F (36.4 C) (Oral)  03/06/16 97.5 F (36.4 C) (Oral)  BP Readings from Last 3 Encounters:  02/23/18 120/75  08/27/17 112/74  11/25/16 124/72   Pulse Readings from Last 3 Encounters:  02/23/18 98  08/27/17 94  11/25/16 82    Physical Exam  Constitutional: He is oriented to person, place, and time. He appears well-developed. He does not appear ill.  Eyes: Pupils are equal, round, and reactive to light. Conjunctivae and EOM are normal.  Cardiovascular: Normal rate, regular rhythm, S1  normal, S2 normal, normal heart sounds, intact distal pulses and normal pulses. Exam reveals no gallop and no friction rub.  No murmur heard. Pulmonary/Chest: Effort normal. No stridor. No respiratory distress. He has no wheezes. He has no rales.  Abdominal: He exhibits no distension.  Musculoskeletal: Normal range of motion. He exhibits no edema.  Neurological: He is alert and oriented to person, place, and time. No cranial nerve deficit. Coordination normal.  Skin: Skin is warm and dry. He is not diaphoretic.  Psychiatric: He has a normal mood and affect.  Nursing note and vitals reviewed.   Lab Results  Component Value Date   HGBA1C 6.1 (H) 08/27/2017    Lab Results  Component Value Date   WBC 7.7 08/27/2017   HGB 17.4 (H) 08/27/2017   HCT 49.9 08/27/2017   MCV 95.8 08/27/2017   PLT 252 08/27/2017    Lab Results  Component Value Date   CREATININE 0.81 10/29/2017   BUN 24 10/29/2017   NA 137 10/29/2017   K 3.8 10/29/2017   CL 98 10/29/2017   CO2 29 10/29/2017    Lab Results  Component Value Date   ALT 38 08/27/2017   AST 25 08/27/2017   ALKPHOS CANCELED 08/27/2017   BILITOT 0.7 08/27/2017    Lab Results  Component Value Date   TSH 1.16 08/27/2017    Lab Results  Component Value Date   CHOL 147 08/27/2017   HDL 40 (L) 08/27/2017   LDLCALC 81 08/27/2017   TRIG 156 (H) 08/27/2017   CHOLHDL 3.7 08/27/2017     ASSESSMENT AND PLAN:  Cortavious was seen today for hypertension.  Diagnoses and all orders for this visit:  Hypertension, well controlled Comments: Well-managed.  Continue current plan. Orders: -     Cancel: Lipid panel -     Cancel: Comprehensive metabolic panel  Prediabetes Comments: A1c pending. Orders: -     Cancel: Hemoglobin A1c  At risk for acute ischemic cardiac event Comments: Advising he continue ASA 81 mg daily.  No history of GI bleed.  Other orders -     aspirin EC 81 MG tablet; Take 1 tablet (81 mg total) by mouth  daily.    The patient is advised to call or return to clinic if he does not see an improvement in symptoms, or to seek the care of the closest emergency department if he worsens with the above plan.   Philis Fendt, MHS, PA-C [facility] 02/23/2018 10:10 AM

## 2018-02-23 NOTE — Addendum Note (Signed)
Addended by: Burnis Kingfisher on: 02/23/2018 02:20 PM   Modules accepted: Orders

## 2018-02-24 LAB — COMPREHENSIVE METABOLIC PANEL
AG RATIO: 1.5 (calc) (ref 1.0–2.5)
ALKALINE PHOSPHATASE (APISO): 37 U/L — AB (ref 40–115)
ALT: 31 U/L (ref 9–46)
AST: 24 U/L (ref 10–35)
Albumin: 4.1 g/dL (ref 3.6–5.1)
BILIRUBIN TOTAL: 0.7 mg/dL (ref 0.2–1.2)
BUN/Creatinine Ratio: 35 (calc) — ABNORMAL HIGH (ref 6–22)
BUN: 23 mg/dL (ref 7–25)
CALCIUM: 9.7 mg/dL (ref 8.6–10.3)
CHLORIDE: 103 mmol/L (ref 98–110)
CO2: 22 mmol/L (ref 20–32)
Creat: 0.65 mg/dL — ABNORMAL LOW (ref 0.70–1.25)
Globulin: 2.8 g/dL (calc) (ref 1.9–3.7)
Glucose, Bld: 126 mg/dL — ABNORMAL HIGH (ref 65–99)
Potassium: 3.6 mmol/L (ref 3.5–5.3)
Sodium: 137 mmol/L (ref 135–146)
Total Protein: 6.9 g/dL (ref 6.1–8.1)

## 2018-02-24 LAB — LIPID PANEL
Cholesterol: 144 mg/dL (ref <200)
HDL: 39 mg/dL — ABNORMAL LOW (ref >40)
LDL Cholesterol (Calc): 85 mg/dL (calc)
NON-HDL CHOLESTEROL (CALC): 105 mg/dL (ref <130)
TRIGLYCERIDES: 105 mg/dL (ref <150)
Total CHOL/HDL Ratio: 3.7 (calc) (ref <5.0)

## 2018-02-24 LAB — HEMOGLOBIN A1C
EAG (MMOL/L): 6.6 (calc)
HEMOGLOBIN A1C: 5.8 %{Hb} — AB (ref <5.7)
MEAN PLASMA GLUCOSE: 120 (calc)

## 2018-04-06 ENCOUNTER — Encounter: Payer: Self-pay | Admitting: Physician Assistant

## 2018-06-03 ENCOUNTER — Telehealth: Payer: Self-pay | Admitting: Family Medicine

## 2018-06-03 NOTE — Telephone Encounter (Signed)
Called pt to reschedule appt for 09/01/18 with Dr. Carlota Raspberry due to provider schedule change. I was able to reschedule for 09/07/18 at 8:20.  I advised of time, building location and late policy as well as fasting. Pt acknowledged.

## 2018-08-31 ENCOUNTER — Encounter: Payer: BLUE CROSS/BLUE SHIELD | Admitting: Physician Assistant

## 2018-09-01 ENCOUNTER — Encounter: Payer: BLUE CROSS/BLUE SHIELD | Admitting: Family Medicine

## 2018-09-07 ENCOUNTER — Encounter: Payer: Self-pay | Admitting: Family Medicine

## 2018-09-07 ENCOUNTER — Ambulatory Visit (INDEPENDENT_AMBULATORY_CARE_PROVIDER_SITE_OTHER): Payer: BLUE CROSS/BLUE SHIELD | Admitting: Family Medicine

## 2018-09-07 ENCOUNTER — Other Ambulatory Visit: Payer: Self-pay

## 2018-09-07 VITALS — BP 126/80 | HR 104 | Temp 97.6°F | Resp 16 | Ht 73.0 in | Wt 212.8 lb

## 2018-09-07 DIAGNOSIS — E785 Hyperlipidemia, unspecified: Secondary | ICD-10-CM

## 2018-09-07 DIAGNOSIS — R05 Cough: Secondary | ICD-10-CM | POA: Diagnosis not present

## 2018-09-07 DIAGNOSIS — R739 Hyperglycemia, unspecified: Secondary | ICD-10-CM

## 2018-09-07 DIAGNOSIS — I10 Essential (primary) hypertension: Secondary | ICD-10-CM

## 2018-09-07 DIAGNOSIS — Z0001 Encounter for general adult medical examination with abnormal findings: Secondary | ICD-10-CM

## 2018-09-07 DIAGNOSIS — Z0000 Encounter for general adult medical examination without abnormal findings: Secondary | ICD-10-CM

## 2018-09-07 DIAGNOSIS — R059 Cough, unspecified: Secondary | ICD-10-CM

## 2018-09-07 MED ORDER — SIMVASTATIN 20 MG PO TABS: 20.0000 mg | ORAL_TABLET | Freq: Every day | ORAL | 3 refills | Status: DC

## 2018-09-07 MED ORDER — LOSARTAN POTASSIUM-HCTZ 50-12.5 MG PO TABS: 1.0000 | ORAL_TABLET | Freq: Every day | ORAL | 1 refills | Status: DC

## 2018-09-07 NOTE — Addendum Note (Signed)
Addended by: Leim Fabry on: 09/07/2018 09:35 AM   Modules accepted: Orders

## 2018-09-07 NOTE — Progress Notes (Signed)
Subjective:    Patient ID: Travis Ellis, male    DOB: [birth year], 63 y.o.   MRN: [id number]  HPI Travis Ellis is a 63 y.o. male Presents today for: Chief Complaint  Patient presents with  . Annual Exam    coughing for 1 yr now and think it is a side effect to the lisinopril, cough meds helps it some but think we should know about the coughing  New patient to me, followed previously by Philis Fendt.  Here for physical as well as concerns above.   Hypertension: BP Readings from Last 3 Encounters:  09/07/18 126/80  02/23/18 120/75  08/27/17 112/74   Lab Results  Component Value Date   CREATININE 0.65 (L) 02/23/2018  Takes lisinopril HCTZ 10/12.5 mg daily.  On aspirin 81 mg daily. Home BP similar to office readings.   History of prostate cancer: Dr. Alinda Money, [facility].  Office visit in July 2019, 11 years out from radical prostatectomy, 4 years out from salvage radiation therapy.  Hyperlipidemia:  Lab Results  Component Value Date   CHOL 144 02/23/2018   HDL 39 (L) 02/23/2018   LDLCALC 85 02/23/2018   TRIG 105 02/23/2018   CHOLHDL 3.7 02/23/2018   Lab Results  Component Value Date   ALT 31 02/23/2018   AST 24 02/23/2018   ALKPHOS CANCELED 08/27/2017   BILITOT 0.7 02/23/2018  Takes Zocor 20 mg daily. No new side effects.   Cough: On ACE inhibitor as above.  Cough present for approximately 1 year. Off and on for a year. Took tessalon for [employer]. Cough most days. Dry, no fever, no hemoptysis. No unexplained night sweats.   Hyperglycemia: Lab Results  Component Value Date   HGBA1C 5.8 (H) 02/23/2018   Wt Readings from Last 3 Encounters:  09/07/18 212 lb 12.8 oz (96.5 kg)  02/23/18 218 lb 12.8 oz (99.2 kg)  08/27/17 216 lb 6.4 oz (98.2 kg)   Cancer screening: Colonoscopy 07/05/11, repeat 10 years Followed by [facility] for history of prostate cancer  Immunization History  Administered Date(s) Administered  . Influenza Split 09/06/2015  .  Influenza-Unspecified 05/27/2018  . Tdap 05/10/2015  shingles vaccine - zostavax.  Does not want shingrix.   Depression screen [facility] 2/9 09/07/2018 02/23/2018 08/27/2017 11/25/2016 03/06/2016  Decreased Interest 0 0 0 0 0  Down, Depressed, Hopeless 0 0 0 0 0  PHQ - 2 Score 0 0 0 0 0    Visual Acuity Screening   Right eye Left eye Both eyes  Without correction: 20/20 20/20 20/20   With correction:     optho - seen once per year.   Dental: has regular follow up.   Exercise:no regular exercise, but has bike at house.   Hx of R leg neuropathy and R foot drop. Neuro at [facility]. Dr. [name].   Review of Systems 13 point review of systems per patient health survey noted.  Negative other than as indicated above or in HPI.     Objective:   Physical Exam Vitals signs reviewed.  Constitutional:      Appearance: He is well-developed.  HENT:     Head: Normocephalic and atraumatic.     Right Ear: External ear normal.     Left Ear: External ear normal.  Eyes:     Conjunctiva/sclera: Conjunctivae normal.     Pupils: Pupils are equal, round, and reactive to light.  Neck:     Musculoskeletal: Normal range of motion and neck supple.  Thyroid: No thyromegaly.  Cardiovascular:     Rate and Rhythm: Normal rate and regular rhythm.     Heart sounds: Normal heart sounds.  Pulmonary:     Effort: Pulmonary effort is normal. No respiratory distress.     Breath sounds: Normal breath sounds. No wheezing.  Abdominal:     General: There is no distension.     Palpations: Abdomen is soft.     Tenderness: There is no abdominal tenderness.  Musculoskeletal: Normal range of motion.        General: No tenderness.  Lymphadenopathy:     Cervical: No cervical adenopathy.  Skin:    General: Skin is warm and dry.  Neurological:     Mental Status: He is alert and oriented to person, place, and time.     Deep Tendon Reflexes: Reflexes are normal and symmetric.  Psychiatric:        Behavior: Behavior normal.     Vitals:   09/07/18 0825  BP: 126/80  Pulse: (!) 104  Resp: 16  Temp: 97.6 F (36.4 C)  TempSrc: Oral  SpO2: 96%  Weight: 212 lb 12.8 oz (96.5 kg)  Height: 6\' 1"  (1.854 m)      Assessment & Plan:    Travis Ellis is a 63 y.o. male Cough  -May be related to ACE inhibitor.  Try changing to ARB, recheck 4 to 6 weeks.  Sooner if worse.  Hypertension, well controlled - Plan: losartan-hydrochlorothiazide (HYZAAR) 50-12.5 MG tablet, Comprehensive metabolic panel  -Stable blood pressure but as above we will try ARB in place of ACE inhibitor  Dyslipidemia - Plan: Lipid Panel, simvastatin (ZOCOR) 20 MG tablet  -Tolerating statin, check labs, no changes.  Hyperglycemia - Plan: Hemoglobin A1c  -Commended on diet/activity.  Continue exercise with goal of 150 minutes/week.  Handout given.  Check A1c  Annual physical exam  - -anticipatory guidance as below in AVS, screening labs above. Health maintenance items as above in HPI discussed/recommended as applicable.    Meds ordered this encounter  Medications  . simvastatin (ZOCOR) 20 MG tablet    Sig: Take 1 tablet (20 mg total) by mouth at bedtime.    Dispense:  90 tablet    Refill:  3  . losartan-hydrochlorothiazide (HYZAAR) 50-12.5 MG tablet    Sig: Take 1 tablet by mouth daily.    Dispense:  90 tablet    Refill:  1   Patient Instructions    Try losartan hct in place of lisinopril hct to see if that resolves cough. If cough persists, recheck in next few weeks.   Continue to work on diet/exercise for blood sugar.  Thanks for coming in today.    Prediabetes Prediabetes is the condition of having a blood sugar (blood glucose) level that is higher than it should be, but not high enough for you to be diagnosed with type 2 diabetes. Having prediabetes puts you at risk for developing type 2 diabetes (type 2 diabetes mellitus). Prediabetes may be called impaired glucose tolerance or impaired fasting glucose. Prediabetes usually  does not cause symptoms. Your health care provider can diagnose this condition with blood tests. You may be tested for prediabetes if you are overweight and if you have at least one other risk factor for prediabetes. What is blood glucose, and how is it measured? Blood glucose refers to the amount of glucose in your bloodstream. Glucose comes from eating foods that contain sugars and starches (carbohydrates), which the body breaks down into glucose.  Your blood glucose level may be measured in mg/dL (milligrams per deciliter) or mmol/L (millimoles per liter). Your blood glucose may be checked with one or more of the following blood tests:  A fasting blood glucose (FBG) test. You will not be allowed to eat (you will fast) for 8 hours or longer before a blood sample is taken. ? A normal range for FBG is 70-100 mg/dl (3.9-5.6 mmol/L).  An A1c (hemoglobin A1c) blood test. This test provides information about blood glucose control over the previous 2?80months.  An oral glucose tolerance test (OGTT). This test measures your blood glucose at two times: ? After fasting. This is your baseline level. ? Two hours after you drink a beverage that contains glucose. You may be diagnosed with prediabetes:  If your FBG is 100?125 mg/dL (5.6-6.9 mmol/L).  If your A1c level is 5.7?6.4%.  If your OGTT result is 140?199 mg/dL (7.8-11 mmol/L). These blood tests may be repeated to confirm your diagnosis. How can this condition affect me? The pancreas produces a hormone (insulin) that helps to move glucose from the bloodstream into cells. When cells in the body do not respond properly to insulin that the body makes (insulin resistance), excess glucose builds up in the blood instead of going into cells. As a result, high blood glucose (hyperglycemia) can develop, which can cause many complications. Hyperglycemia is a symptom of prediabetes. Having high blood glucose for a long time is dangerous. Too much glucose in your  blood can damage your nerves and blood vessels. Long-term damage can lead to complications from diabetes, which may include:  Heart disease.  Stroke.  Blindness.  Kidney disease.  Depression.  Poor circulation in the feet and legs, which could lead to surgical removal (amputation) in severe cases. What can increase my risk? Risk factors for prediabetes include:  Having a family member with type 2 diabetes.  Being overweight or obese.  Being older than age 60.  Being of American [place], African-American, Hispanic/Latino, or Asian/Pacific Islander descent.  Having an inactive (sedentary) lifestyle.  Having a history of heart disease.  History of gestational diabetes or polycystic ovary syndrome (PCOS), in women.  Having low levels of good cholesterol (HDL-C) or high levels of blood fats (triglycerides).  Having high blood pressure. What actions can I take to prevent diabetes?      Be physically active. ? Do moderate-intensity physical activity for 30 or more minutes on 5 or more days of the week, or as much as told by your health care provider. This could be brisk walking, biking, or water aerobics. ? Ask your health care provider what activities are safe for you. A mix of physical activities may be best, such as walking, swimming, cycling, and strength training.  Lose weight as told by your health care provider. ? Losing 5-7% of your body weight can reverse insulin resistance. ? Your health care provider can determine how much weight loss is best for you and can help you lose weight safely.  Follow a healthy meal plan. This includes eating lean proteins, complex carbohydrates, fresh fruits and vegetables, low-fat dairy products, and healthy fats. ? Follow instructions from your health care provider about eating or drinking restrictions. ? Make an appointment to see a diet and nutrition specialist (registered dietitian) to help you create a healthy eating plan that is  right for you.  Do not smoke or use any tobacco products, such as cigarettes, chewing tobacco, and e-cigarettes. If you need help quitting, ask your health  care provider.  Take over-the-counter and prescription medicines as told by your health care provider. You may be prescribed medicines that help lower the risk of type 2 diabetes.  Keep all follow-up visits as told by your health care provider. This is important. Summary  Prediabetes is the condition of having a blood sugar (blood glucose) level that is higher than it should be, but not high enough for you to be diagnosed with type 2 diabetes.  Having prediabetes puts you at risk for developing type 2 diabetes (type 2 diabetes mellitus).  To help prevent type 2 diabetes, make lifestyle changes such as being physically active and eating a healthy diet. Lose weight as told by your health care provider. This information is not intended to replace advice given to you by your health care provider. Make sure you discuss any questions you have with your health care provider. Document Released: 12/11/2015 Document Revised: 04/08/2017 Document Reviewed: 10/10/2015 Elsevier Interactive Patient Education  2019 [place] you healthy  Get these tests  Blood pressure- Have your blood pressure checked once a year by your healthcare provider.  Normal blood pressure is 120/80  Weight- Have your body mass index (BMI) calculated to screen for obesity.  BMI is a measure of body fat based on height and weight. You can also calculate your own BMI at [email or web address].  Cholesterol- Have your cholesterol checked every year.  Diabetes- Have your blood sugar checked regularly if you have high blood pressure, high cholesterol, have a family history of diabetes or if you are overweight.  Screening for Colon Cancer- Colonoscopy starting at age 45.  Screening may begin sooner depending on your family history and other health conditions.  Follow up colonoscopy as directed by your Gastroenterologist.  Screening for Prostate Cancer- Both blood work (PSA) and a rectal exam help screen for Prostate Cancer.  Screening begins at age 23 with African-American men and at age 48 with Caucasian men.  Screening may begin sooner depending on your family history.  Take these medicines  Aspirin- One aspirin daily can help prevent Heart disease and Stroke.  Flu shot- Every fall.  Tetanus- Every 10 years.  Zostavax- Once after the age of 67 to prevent Shingles.  Pneumonia shot- Once after the age of 80; if you are younger than 29, ask your healthcare provider if you need a Pneumonia shot.  Take these steps  Don't smoke- If you do smoke, talk to your doctor about quitting.  For tips on how to quit, go to www.smokefree.gov or call 1-800-QUIT-NOW.  Be physically active- Exercise 5 days a week for at least 30 minutes.  If you are not already physically active start slow and gradually work up to 30 minutes of moderate physical activity.  Examples of moderate activity include walking briskly, mowing the yard, dancing, swimming, bicycling, etc.  Eat a healthy diet- Eat a variety of healthy food such as fruits, vegetables, low fat milk, low fat cheese, yogurt, lean meant, poultry, fish, beans, tofu, etc. For more information go to www.thenutritionsource.org  Drink alcohol in moderation- Limit alcohol intake to less than two drinks a day. Never drink and drive.  Dentist- Brush and floss twice daily; visit your dentist twice a year.  Depression- Your emotional health is as important as your physical health. If you're feeling down, or losing interest in things you would normally enjoy please talk to your healthcare provider.  Eye exam- Visit your eye doctor every year.  Safe sex- If you  may be exposed to a sexually transmitted infection, use a condom.  Seat belts- Seat belts can save your life; always wear one.  Smoke/Carbon Monoxide  detectors- These detectors need to be installed on the appropriate level of your home.  Replace batteries at least once a year.  Skin cancer- When out in the sun, cover up and use sunscreen 15 SPF or higher.  Violence- If anyone is threatening you, please tell your healthcare provider.  Living Will/ Health care power of attorney- Speak with your healthcare provider and family.   If you have lab work done today you will be contacted with your lab results within the next 2 weeks.  If you have not heard from [place] then please contact us. The fastest way to get your results is to register for My Chart.   IF you received an x-ray today, you will receive an invoice from [facility]. Please contact [facility] at [phone] with questions or concerns regarding your invoice.   IF you received labwork today, you will receive an invoice from [facility]. Please contact [facility] at [phone] with questions or concerns regarding your invoice.   Our billing staff will not be able to assist you with questions regarding bills from these companies.  You will be contacted with the lab results as soon as they are available. The fastest way to get your results is to activate your My Chart account. Instructions are located on the last page of this paperwork. If you have not heard from [place] regarding the results in 2 weeks, please contact this office.       Signed,   Merri Ray, MD [facility].  09/07/18 9:22 AM

## 2018-09-07 NOTE — Patient Instructions (Addendum)
Try losartan hct in place of lisinopril hct to see if that resolves cough. If cough persists, recheck in next few weeks.   Continue to work on diet/exercise for blood sugar.  Thanks for coming in today.    Prediabetes Prediabetes is the condition of having a blood sugar (blood glucose) level that is higher than it should be, but not high enough for you to be diagnosed with type 2 diabetes. Having prediabetes puts you at risk for developing type 2 diabetes (type 2 diabetes mellitus). Prediabetes may be called impaired glucose tolerance or impaired fasting glucose. Prediabetes usually does not cause symptoms. Your health care provider can diagnose this condition with blood tests. You may be tested for prediabetes if you are overweight and if you have at least one other risk factor for prediabetes. What is blood glucose, and how is it measured? Blood glucose refers to the amount of glucose in your bloodstream. Glucose comes from eating foods that contain sugars and starches (carbohydrates), which the body breaks down into glucose. Your blood glucose level may be measured in mg/dL (milligrams per deciliter) or mmol/L (millimoles per liter). Your blood glucose may be checked with one or more of the following blood tests:  A fasting blood glucose (FBG) test. You will not be allowed to eat (you will fast) for 8 hours or longer before a blood sample is taken. ? A normal range for FBG is 70-100 mg/dl (3.9-5.6 mmol/L).  An A1c (hemoglobin A1c) blood test. This test provides information about blood glucose control over the previous 2?75months.  An oral glucose tolerance test (OGTT). This test measures your blood glucose at two times: ? After fasting. This is your baseline level. ? Two hours after you drink a beverage that contains glucose. You may be diagnosed with prediabetes:  If your FBG is 100?125 mg/dL (5.6-6.9 mmol/L).  If your A1c level is 5.7?6.4%.  If your OGTT result is 140?199 mg/dL  (7.8-11 mmol/L). These blood tests may be repeated to confirm your diagnosis. How can this condition affect me? The pancreas produces a hormone (insulin) that helps to move glucose from the bloodstream into cells. When cells in the body do not respond properly to insulin that the body makes (insulin resistance), excess glucose builds up in the blood instead of going into cells. As a result, high blood glucose (hyperglycemia) can develop, which can cause many complications. Hyperglycemia is a symptom of prediabetes. Having high blood glucose for a long time is dangerous. Too much glucose in your blood can damage your nerves and blood vessels. Long-term damage can lead to complications from diabetes, which may include:  Heart disease.  Stroke.  Blindness.  Kidney disease.  Depression.  Poor circulation in the feet and legs, which could lead to surgical removal (amputation) in severe cases. What can increase my risk? Risk factors for prediabetes include:  Having a family member with type 2 diabetes.  Being overweight or obese.  Being older than age 22.  Being of American [place], African-American, Hispanic/Latino, or Asian/Pacific Islander descent.  Having an inactive (sedentary) lifestyle.  Having a history of heart disease.  History of gestational diabetes or polycystic ovary syndrome (PCOS), in women.  Having low levels of good cholesterol (HDL-C) or high levels of blood fats (triglycerides).  Having high blood pressure. What actions can I take to prevent diabetes?      Be physically active. ? Do moderate-intensity physical activity for 30 or more minutes on 5 or more days of the week,  or as much as told by your health care provider. This could be brisk walking, biking, or water aerobics. ? Ask your health care provider what activities are safe for you. A mix of physical activities may be best, such as walking, swimming, cycling, and strength training.  Lose weight as  told by your health care provider. ? Losing 5-7% of your body weight can reverse insulin resistance. ? Your health care provider can determine how much weight loss is best for you and can help you lose weight safely.  Follow a healthy meal plan. This includes eating lean proteins, complex carbohydrates, fresh fruits and vegetables, low-fat dairy products, and healthy fats. ? Follow instructions from your health care provider about eating or drinking restrictions. ? Make an appointment to see a diet and nutrition specialist (registered dietitian) to help you create a healthy eating plan that is right for you.  Do not smoke or use any tobacco products, such as cigarettes, chewing tobacco, and e-cigarettes. If you need help quitting, ask your health care provider.  Take over-the-counter and prescription medicines as told by your health care provider. You may be prescribed medicines that help lower the risk of type 2 diabetes.  Keep all follow-up visits as told by your health care provider. This is important. Summary  Prediabetes is the condition of having a blood sugar (blood glucose) level that is higher than it should be, but not high enough for you to be diagnosed with type 2 diabetes.  Having prediabetes puts you at risk for developing type 2 diabetes (type 2 diabetes mellitus).  To help prevent type 2 diabetes, make lifestyle changes such as being physically active and eating a healthy diet. Lose weight as told by your health care provider. This information is not intended to replace advice given to you by your health care provider. Make sure you discuss any questions you have with your health care provider. Document Released: 12/11/2015 Document Revised: 04/08/2017 Document Reviewed: 10/10/2015 Elsevier Interactive Patient Education  2019 [place] you healthy  Get these tests  Blood pressure- Have your blood pressure checked once a year by your healthcare provider.   Normal blood pressure is 120/80  Weight- Have your body mass index (BMI) calculated to screen for obesity.  BMI is a measure of body fat based on height and weight. You can also calculate your own BMI at [email or web address].  Cholesterol- Have your cholesterol checked every year.  Diabetes- Have your blood sugar checked regularly if you have high blood pressure, high cholesterol, have a family history of diabetes or if you are overweight.  Screening for Colon Cancer- Colonoscopy starting at age 48.  Screening may begin sooner depending on your family history and other health conditions. Follow up colonoscopy as directed by your Gastroenterologist.  Screening for Prostate Cancer- Both blood work (PSA) and a rectal exam help screen for Prostate Cancer.  Screening begins at age 69 with African-American men and at age 78 with Caucasian men.  Screening may begin sooner depending on your family history.  Take these medicines  Aspirin- One aspirin daily can help prevent Heart disease and Stroke.  Flu shot- Every fall.  Tetanus- Every 10 years.  Zostavax- Once after the age of 67 to prevent Shingles.  Pneumonia shot- Once after the age of 17; if you are younger than 64, ask your healthcare provider if you need a Pneumonia shot.  Take these steps  Don't smoke- If you do smoke, talk to your doctor  about quitting.  For tips on how to quit, go to www.smokefree.gov or call 1-800-QUIT-NOW.  Be physically active- Exercise 5 days a week for at least 30 minutes.  If you are not already physically active start slow and gradually work up to 30 minutes of moderate physical activity.  Examples of moderate activity include walking briskly, mowing the yard, dancing, swimming, bicycling, etc.  Eat a healthy diet- Eat a variety of healthy food such as fruits, vegetables, low fat milk, low fat cheese, yogurt, lean meant, poultry, fish, beans, tofu, etc. For more information go to  www.thenutritionsource.org  Drink alcohol in moderation- Limit alcohol intake to less than two drinks a day. Never drink and drive.  Dentist- Brush and floss twice daily; visit your dentist twice a year.  Depression- Your emotional health is as important as your physical health. If you're feeling down, or losing interest in things you would normally enjoy please talk to your healthcare provider.  Eye exam- Visit your eye doctor every year.  Safe sex- If you may be exposed to a sexually transmitted infection, use a condom.  Seat belts- Seat belts can save your life; always wear one.  Smoke/Carbon Monoxide detectors- These detectors need to be installed on the appropriate level of your home.  Replace batteries at least once a year.  Skin cancer- When out in the sun, cover up and use sunscreen 15 SPF or higher.  Violence- If anyone is threatening you, please tell your healthcare provider.  Living Will/ Health care power of attorney- Speak with your healthcare provider and family.   If you have lab work done today you will be contacted with your lab results within the next 2 weeks.  If you have not heard from [place] then please contact us. The fastest way to get your results is to register for My Chart.   IF you received an x-ray today, you will receive an invoice from [facility]. Please contact [facility] at [phone] with questions or concerns regarding your invoice.   IF you received labwork today, you will receive an invoice from [facility]. Please contact [facility] at [phone] with questions or concerns regarding your invoice.   Our billing staff will not be able to assist you with questions regarding bills from these companies.  You will be contacted with the lab results as soon as they are available. The fastest way to get your results is to activate your My Chart account. Instructions are located on the last page of this paperwork. If you have not heard from [place]  regarding the results in 2 weeks, please contact this office.

## 2018-09-09 LAB — COMPREHENSIVE METABOLIC PANEL
AG Ratio: 1.6 (calc) (ref 1.0–2.5)
ALBUMIN MSPROF: 4.2 g/dL (ref 3.6–5.1)
ALT: 36 U/L (ref 9–46)
AST: 22 U/L (ref 10–35)
Alkaline phosphatase (APISO): 46 U/L (ref 40–115)
BUN/Creatinine Ratio: 39 (calc) — ABNORMAL HIGH (ref 6–22)
BUN: 24 mg/dL (ref 7–25)
CO2: 25 mmol/L (ref 20–32)
Calcium: 9.8 mg/dL (ref 8.6–10.3)
Chloride: 101 mmol/L (ref 98–110)
Creat: 0.61 mg/dL — ABNORMAL LOW (ref 0.70–1.25)
Globulin: 2.7 g/dL (calc) (ref 1.9–3.7)
Glucose, Bld: 132 mg/dL — ABNORMAL HIGH (ref 65–99)
Potassium: 3.6 mmol/L (ref 3.5–5.3)
Sodium: 136 mmol/L (ref 135–146)
Total Bilirubin: 0.8 mg/dL (ref 0.2–1.2)
Total Protein: 6.9 g/dL (ref 6.1–8.1)

## 2018-09-09 LAB — LIPID PANEL
CHOL/HDL RATIO: 3.7 (calc) (ref <5.0)
Cholesterol: 148 mg/dL (ref <200)
HDL: 40 mg/dL — AB (ref >40)
LDL CHOLESTEROL (CALC): 87 mg/dL
Non-HDL Cholesterol (Calc): 108 mg/dL (calc) (ref <130)
TRIGLYCERIDES: 118 mg/dL (ref <150)

## 2018-09-09 LAB — HEMOGLOBIN A1C

## 2018-09-18 ENCOUNTER — Ambulatory Visit (INDEPENDENT_AMBULATORY_CARE_PROVIDER_SITE_OTHER): Payer: BLUE CROSS/BLUE SHIELD | Admitting: Family Medicine

## 2018-09-18 DIAGNOSIS — Z0000 Encounter for general adult medical examination without abnormal findings: Secondary | ICD-10-CM

## 2018-09-18 DIAGNOSIS — I10 Essential (primary) hypertension: Secondary | ICD-10-CM

## 2018-09-18 NOTE — Addendum Note (Signed)
Addended by: Gari Crown D on: 09/18/2018 09:30 AM   Modules accepted: Orders

## 2018-09-19 LAB — HEMOGLOBIN A1C
Hgb A1c MFr Bld: 6.2 % of total Hgb — ABNORMAL HIGH (ref <5.7)
Mean Plasma Glucose: 131 (calc)
eAG (mmol/L): 7.3 (calc)

## 2018-09-20 NOTE — Progress Notes (Signed)
Lab only visit 

## 2018-09-25 ENCOUNTER — Telehealth: Payer: BLUE CROSS/BLUE SHIELD | Admitting: Family

## 2018-09-25 DIAGNOSIS — R059 Cough, unspecified: Secondary | ICD-10-CM

## 2018-09-25 DIAGNOSIS — R05 Cough: Secondary | ICD-10-CM

## 2018-09-25 MED ORDER — BENZONATATE 100 MG PO CAPS: 100.0000 mg | ORAL_CAPSULE | Freq: Three times a day (TID) | ORAL | 0 refills | Status: DC | PRN

## 2018-09-25 NOTE — Progress Notes (Signed)
Thank you for the details you included in the comment boxes. Those details are very helpful in determining the best course of treatment for you and help [place] to provide the best care.  We are sorry that you are not feeling well.  Here is how we plan to help!  Based on your presentation I believe you most likely have A cough due to a virus.  This is called viral bronchitis and is best treated by rest, plenty of fluids and control of the cough.  You may use Ibuprofen or Tylenol as directed to help your symptoms.     In addition you may use A non-prescription cough medication called Mucinex DM: take 2 tablets every 12 hours. and A prescription cough medication called Tessalon Perles 100mg . You may take 1-2 capsules every 8 hours as needed for your cough.    From your responses in the eVisit questionnaire you describe inflammation in the upper respiratory tract which is causing a significant cough.  This is commonly called Bronchitis and has four common causes:    Allergies  Viral Infections  Acid Reflux  Bacterial Infection Allergies, viruses and acid reflux are treated by controlling symptoms or eliminating the cause. An example might be a cough caused by taking certain blood pressure medications. You stop the cough by changing the medication. Another example might be a cough caused by acid reflux. Controlling the reflux helps control the cough.  USE OF BRONCHODILATOR ("RESCUE") INHALERS: There is a risk from using your bronchodilator too frequently.  The risk is that over-reliance on a medication which only relaxes the muscles surrounding the breathing tubes can reduce the effectiveness of medications prescribed to reduce swelling and congestion of the tubes themselves.  Although you feel brief relief from the bronchodilator inhaler, your asthma may actually be worsening with the tubes becoming more swollen and filled with mucus.  This can delay other crucial treatments, such as oral steroid  medications. If you need to use a bronchodilator inhaler daily, several times per day, you should discuss this with your provider.  There are probably better treatments that could be used to keep your asthma under control.     HOME CARE . Only take medications as instructed by your medical team. . Complete the entire course of an antibiotic. . Drink plenty of fluids and get plenty of rest. . Avoid close contacts especially the very young and the elderly . Cover your mouth if you cough or cough into your sleeve. . Always remember to wash your hands . A steam or ultrasonic humidifier can help congestion.   GET HELP RIGHT AWAY IF: . You develop worsening fever. . You become short of breath . You cough up blood. . Your symptoms persist after you have completed your treatment plan MAKE SURE YOU   Understand these instructions.  Will watch your condition.  Will get help right away if you are not doing well or get worse.  Your e-visit answers were reviewed by a board certified advanced clinical practitioner to complete your personal care plan.  Depending on the condition, your plan could have included both over the counter or prescription medications. If there is a problem please reply  once you have received a response from your provider. Your safety is important to [place].  If you have drug allergies check your prescription carefully.    You can use MyChart to ask questions about today's visit, request a non-urgent call back, or ask for a work or school excuse  for 24 hours related to this e-Visit. If it has been greater than 24 hours you will need to follow up with your provider, or enter a new e-Visit to address those concerns. You will get an e-mail in the next two days asking about your experience.  I hope that your e-visit has been valuable and will speed your recovery. Thank you for using e-visits.

## 2018-10-22 ENCOUNTER — Ambulatory Visit: Payer: BLUE CROSS/BLUE SHIELD | Admitting: Family Medicine

## 2018-10-22 ENCOUNTER — Encounter: Payer: Self-pay | Admitting: Family Medicine

## 2018-10-22 VITALS — BP 107/73 | HR 94 | Temp 97.9°F | Resp 14 | Ht 73.0 in | Wt 213.6 lb

## 2018-10-22 DIAGNOSIS — I10 Essential (primary) hypertension: Secondary | ICD-10-CM | POA: Diagnosis not present

## 2018-10-22 DIAGNOSIS — R7303 Prediabetes: Secondary | ICD-10-CM

## 2018-10-22 DIAGNOSIS — M75102 Unspecified rotator cuff tear or rupture of left shoulder, not specified as traumatic: Secondary | ICD-10-CM | POA: Insufficient documentation

## 2018-10-22 DIAGNOSIS — R05 Cough: Secondary | ICD-10-CM | POA: Diagnosis not present

## 2018-10-22 DIAGNOSIS — R059 Cough, unspecified: Secondary | ICD-10-CM

## 2018-10-22 MED ORDER — LOSARTAN POTASSIUM-HCTZ 50-12.5 MG PO TABS: 1.0000 | ORAL_TABLET | Freq: Every day | ORAL | 1 refills | Status: DC

## 2018-10-22 NOTE — Progress Notes (Signed)
Subjective:    Patient ID: Travis Ellis, male    DOB: 1955/10/23, 63 y.o.   MRN: [id number]  HPI Travis Ellis is a 63 y.o. male Presents today for: Chief Complaint  Patient presents with  . Hypertension    6 wk follow up on blood pressure. bp in office was good today  . Cough    on 09/07/18 visit stopped lisinopril and replaced with losartan. The losartan medication has helped with the cough but still have a little cough but think its from the bronchitis just got over.    Hypertension: BP Readings from Last 3 Encounters:  10/22/18 107/73  09/07/18 126/80  02/23/18 120/75   Lab Results  Component Value Date   CREATININE 0.61 (L) 09/07/2018   Changed from ACE inhibitor to ARB in January due to cough.  Currently on losartan HCTZ 50/12.5 mg daily. No home readings.   Minimal cough, but was seen for ED visit on January 24.  Thought to have viral bronchitis at that time, recommended Mucinex, Tessalon. Cough almost gone.   Prediabetes: Lab Results  Component Value Date   HGBA1C 6.2 (H) 09/18/2018  has tried to avoid sweets, bread.  Exercise - some walking daily, but less with recent cough.   Has concealed carry permit. No history of depression or mental illness. No SI/HI.     Patient Active Problem List   Diagnosis Date Noted  . Risk for coronary artery disease between 10% and 20% in next 10 years 11/27/2016  . Prostate cancer ([place]) 09/14/2012  . Abnormal gait 05/21/2012  . HTN (hypertension) 02/03/2012  . Neuropathy, leg 02/03/2012   Past Medical History:  Diagnosis Date  . Allergy   . Arthritis   . Cancer [facility]) 2008   prostate  . GERD (gastroesophageal reflux disease)   . Hyperlipidemia   . Hypertension   . Neuromuscular disorder ([place])   . Prostate cancer ([place])    [id number] adenocarcinoma of the prostate   Past Surgical History:  Procedure Laterality Date  . CHOLECYSTECTOMY N/A 03/16/2015   Procedure: LAPAROSCOPIC CHOLECYSTECTOMY WITH INTRAOPERATIVE  CHOLANGIOGRAM;  Surgeon: [name], MD;  Location: [facility];  Service: General;  Laterality: N/A;  . JOINT REPLACEMENT Right 08/2009   knee  . REPLACEMENT TOTAL KNEE  2010   right knee  . ROTATOR CUFF REPAIR Left 06/21/2015   No Known Allergies Prior to Admission medications   Medication Sig Start Date End Date Taking? Authorizing Provider  aspirin EC 81 MG tablet Take 1 tablet (81 mg total) by mouth daily. 02/23/18  Yes Tereasa Coop, PA-C  losartan-hydrochlorothiazide (HYZAAR) 50-12.5 MG tablet Take 1 tablet by mouth daily. 09/07/18  Yes Wendie Agreste, MD  Multiple Vitamin (MULTIVITAMIN) tablet Take 1 tablet by mouth daily.   Yes [provider]  simvastatin (ZOCOR) 20 MG tablet Take 1 tablet (20 mg total) by mouth at bedtime. 09/07/18  Yes Wendie Agreste, MD   Social History   Socioeconomic History  . Marital status: Married    Spouse name: Not on file  . Number of children: Not on file  . Years of education: Not on file  . Highest education level: Not on file  Occupational History  . Not on file  Social Needs  . Financial resource strain: Not on file  . Food insecurity:    Worry: Not on file    Inability: Not on file  . Transportation needs:    Medical: Not on file  Non-medical: Not on file  Tobacco Use  . Smoking status: Never Smoker  . Smokeless tobacco: Never Used  Substance and Sexual Activity  . Alcohol use: Yes    Comment: rarely  . Drug use: No  . Sexual activity: Yes  Lifestyle  . Physical activity:    Days per week: Not on file    Minutes per session: Not on file  . Stress: Not on file  Relationships  . Social connections:    Talks on phone: Not on file    Gets together: Not on file    Attends religious service: Not on file    Active member of club or organization: Not on file    Attends meetings of clubs or organizations: Not on file    Relationship status: Not on file  . Intimate partner violence:    Fear of current or ex  partner: Not on file    Emotionally abused: Not on file    Physically abused: Not on file    Forced sexual activity: Not on file  Other Topics Concern  . Not on file  Social History Narrative   Exercise walking daily for 1 mile    Review of Systems  Constitutional: Negative for fatigue and unexpected weight change.  Eyes: Negative for visual disturbance.  Respiratory: Negative for cough, chest tightness and shortness of breath.   Cardiovascular: Negative for chest pain, palpitations and leg swelling.  Gastrointestinal: Negative for abdominal pain and blood in stool.  Neurological: Negative for dizziness, light-headedness and headaches.       Objective:   Physical Exam Vitals signs reviewed.  Constitutional:      Appearance: He is well-developed.  HENT:     Head: Normocephalic and atraumatic.  Eyes:     Pupils: Pupils are equal, round, and reactive to light.  Neck:     Vascular: No carotid bruit or JVD.  Cardiovascular:     Rate and Rhythm: Normal rate and regular rhythm.     Heart sounds: Normal heart sounds. No murmur.  Pulmonary:     Effort: Pulmonary effort is normal.     Breath sounds: Normal breath sounds. No rales.  Skin:    General: Skin is warm and dry.  Neurological:     Mental Status: He is alert and oriented to person, place, and time.    Vitals:   10/22/18 0848  BP: 107/73  Pulse: 94  Resp: 14  Temp: 97.9 F (36.6 C)  TempSrc: Oral  SpO2: 96%  Weight: 213 lb 9.6 oz (96.9 kg)  Height: 6\' 1"  (1.854 m)          Assessment & Plan:    Travis Ellis is a 63 y.o. male Essential hypertension  Cough  Hypertension, well controlled - Plan: losartan-hydrochlorothiazide (HYZAAR) 50-12.5 MG tablet  Blood pressure stable, tolerating losartan HCT, continue same dose.  Cough improving, most recently likely due to bronchitis.  RTC precautions if persistent.  Prediabetes information provided including diet recommendations.  Recheck 6 months   Meds  ordered this encounter  Medications  . losartan-hydrochlorothiazide (HYZAAR) 50-12.5 MG tablet    Sig: Take 1 tablet by mouth daily.    Dispense:  90 tablet    Refill:  1   Patient Instructions    No change in blood pressure meds today. Cough should continue to improve.  Return to the clinic or go to the nearest emergency room if any of your symptoms worsen or new symptoms occur.  See info  below on prediabetes. Low intensity exercise most days per week - 154mins per week. Repeat blood sugar test in next 6 months.    Prediabetes Prediabetes is the condition of having a blood sugar (blood glucose) level that is higher than it should be, but not high enough for you to be diagnosed with type 2 diabetes. Having prediabetes puts you at risk for developing type 2 diabetes (type 2 diabetes mellitus). Prediabetes may be called impaired glucose tolerance or impaired fasting glucose. Prediabetes usually does not cause symptoms. Your health care provider can diagnose this condition with blood tests. You may be tested for prediabetes if you are overweight and if you have at least one other risk factor for prediabetes. What is blood glucose, and how is it measured? Blood glucose refers to the amount of glucose in your bloodstream. Glucose comes from eating foods that contain sugars and starches (carbohydrates), which the body breaks down into glucose. Your blood glucose level may be measured in mg/dL (milligrams per deciliter) or mmol/L (millimoles per liter). Your blood glucose may be checked with one or more of the following blood tests:  A fasting blood glucose (FBG) test. You will not be allowed to eat (you will fast) for 8 hours or longer before a blood sample is taken. ? A normal range for FBG is 70-100 mg/dl (3.9-5.6 mmol/L).  An A1c (hemoglobin A1c) blood test. This test provides information about blood glucose control over the previous 2?10months.  An oral glucose tolerance test (OGTT). This  test measures your blood glucose at two times: ? After fasting. This is your baseline level. ? Two hours after you drink a beverage that contains glucose. You may be diagnosed with prediabetes:  If your FBG is 100?125 mg/dL (5.6-6.9 mmol/L).  If your A1c level is 5.7?6.4%.  If your OGTT result is 140?199 mg/dL (7.8-11 mmol/L). These blood tests may be repeated to confirm your diagnosis. How can this condition affect me? The pancreas produces a hormone (insulin) that helps to move glucose from the bloodstream into cells. When cells in the body do not respond properly to insulin that the body makes (insulin resistance), excess glucose builds up in the blood instead of going into cells. As a result, high blood glucose (hyperglycemia) can develop, which can cause many complications. Hyperglycemia is a symptom of prediabetes. Having high blood glucose for a long time is dangerous. Too much glucose in your blood can damage your nerves and blood vessels. Long-term damage can lead to complications from diabetes, which may include:  Heart disease.  Stroke.  Blindness.  Kidney disease.  Depression.  Poor circulation in the feet and legs, which could lead to surgical removal (amputation) in severe cases. What can increase my risk? Risk factors for prediabetes include:  Having a family member with type 2 diabetes.  Being overweight or obese.  Being older than age 90.  Being of American [place], African-American, Hispanic/Latino, or Asian/Pacific Islander descent.  Having an inactive (sedentary) lifestyle.  Having a history of heart disease.  History of gestational diabetes or polycystic ovary syndrome (PCOS), in women.  Having low levels of good cholesterol (HDL-C) or high levels of blood fats (triglycerides).  Having high blood pressure. What actions can I take to prevent diabetes?      Be physically active. ? Do moderate-intensity physical activity for 30 or more minutes on 5  or more days of the week, or as much as told by your health care provider. This could be brisk walking,  biking, or water aerobics. ? Ask your health care provider what activities are safe for you. A mix of physical activities may be best, such as walking, swimming, cycling, and strength training.  Lose weight as told by your health care provider. ? Losing 5-7% of your body weight can reverse insulin resistance. ? Your health care provider can determine how much weight loss is best for you and can help you lose weight safely.  Follow a healthy meal plan. This includes eating lean proteins, complex carbohydrates, fresh fruits and vegetables, low-fat dairy products, and healthy fats. ? Follow instructions from your health care provider about eating or drinking restrictions. ? Make an appointment to see a diet and nutrition specialist (registered dietitian) to help you create a healthy eating plan that is right for you.  Do not smoke or use any tobacco products, such as cigarettes, chewing tobacco, and e-cigarettes. If you need help quitting, ask your health care provider.  Take over-the-counter and prescription medicines as told by your health care provider. You may be prescribed medicines that help lower the risk of type 2 diabetes.  Keep all follow-up visits as told by your health care provider. This is important. Summary  Prediabetes is the condition of having a blood sugar (blood glucose) level that is higher than it should be, but not high enough for you to be diagnosed with type 2 diabetes.  Having prediabetes puts you at risk for developing type 2 diabetes (type 2 diabetes mellitus).  To help prevent type 2 diabetes, make lifestyle changes such as being physically active and eating a healthy diet. Lose weight as told by your health care provider. This information is not intended to replace advice given to you by your health care provider. Make sure you discuss any questions you have with  your health care provider. Document Released: 12/11/2015 Document Revised: 04/08/2017 Document Reviewed: 10/10/2015 Elsevier Interactive Patient Education  2019 Elsevier Inc.    Prediabetes Eating Plan Prediabetes is a condition that causes blood sugar (glucose) levels to be higher than normal. This increases the risk for developing diabetes. In order to prevent diabetes from developing, your health care provider may recommend a diet and other lifestyle changes to help you:  Control your blood glucose levels.  Improve your cholesterol levels.  Manage your blood pressure. Your health care provider may recommend working with a diet and nutrition specialist (dietitian) to make a meal plan that is best for you. What are tips for following this plan? Lifestyle  Set weight loss goals with the help of your health care team. It is recommended that most people with prediabetes lose 7% of their current body weight.  Exercise for at least 30 minutes at least 5 days a week.  Attend a support group or seek ongoing support from a mental health counselor.  Take over-the-counter and prescription medicines only as told by your health care provider. Reading food labels  Read food labels to check the amount of fat, salt (sodium), and sugar in prepackaged foods. Avoid foods that have: ? Saturated fats. ? Trans fats. ? Added sugars.  Avoid foods that have more than 300 milligrams (mg) of sodium per serving. Limit your daily sodium intake to less than 2,300 mg each day. Shopping  Avoid buying pre-made and processed foods. Cooking  Cook with olive oil. Do not use butter, lard, or ghee.  Bake, broil, grill, or boil foods. Avoid frying. Meal planning   Work with your dietitian to develop an eating plan  that is right for you. This may include: ? Tracking how many calories you take in. Use a food diary, notebook, or mobile application to track what you eat at each meal. ? Using the glycemic index  (GI) to plan your meals. The index tells you how quickly a food will raise your blood glucose. Choose low-GI foods. These foods take a longer time to raise blood glucose.  Consider following a Mediterranean diet. This diet includes: ? Several servings each day of fresh fruits and vegetables. ? Eating fish at least twice a week. ? Several servings each day of whole grains, beans, nuts, and seeds. ? Using olive oil instead of other fats. ? Moderate alcohol consumption. ? Eating small amounts of red meat and whole-fat dairy.  If you have high blood pressure, you may need to limit your sodium intake or follow a diet such as the DASH eating plan. DASH is an eating plan that aims to lower high blood pressure. What foods are recommended? The items listed below may not be a complete list. Talk with your dietitian about what dietary choices are best for you. Grains Whole grains, such as whole-wheat or whole-grain breads, crackers, cereals, and pasta. Unsweetened oatmeal. Bulgur. Barley. Quinoa. Brown rice. Corn or whole-wheat flour tortillas or taco shells. Vegetables Lettuce. Spinach. Peas. Beets. Cauliflower. Cabbage. Broccoli. Carrots. Tomatoes. Squash. Eggplant. Herbs. Peppers. Onions. Cucumbers. Brussels sprouts. Fruits Berries. Bananas. Apples. Oranges. Grapes. Papaya. Mango. Pomegranate. Kiwi. Grapefruit. Cherries. Meats and other protein foods Seafood. Poultry without skin. Lean cuts of pork and beef. Tofu. Eggs. Nuts. Beans. Dairy Low-fat or fat-free dairy products, such as yogurt, cottage cheese, and cheese. Beverages Water. Tea. Coffee. Sugar-free or diet soda. Seltzer water. Lowfat or no-fat milk. Milk alternatives, such as soy or almond milk. Fats and oils Olive oil. Canola oil. Sunflower oil. Grapeseed oil. Avocado. Walnuts. Sweets and desserts Sugar-free or low-fat pudding. Sugar-free or low-fat ice cream and other frozen treats. Seasoning and other foods Herbs. Sodium-free  spices. Mustard. Relish. Low-fat, low-sugar ketchup. Low-fat, low-sugar barbecue sauce. Low-fat or fat-free mayonnaise. What foods are not recommended? The items listed below may not be a complete list. Talk with your dietitian about what dietary choices are best for you. Grains Refined white flour and flour products, such as bread, pasta, snack foods, and cereals. Vegetables Canned vegetables. Frozen vegetables with butter or cream sauce. Fruits Fruits canned with syrup. Meats and other protein foods Fatty cuts of meat. Poultry with skin. Breaded or fried meat. Processed meats. Dairy Full-fat yogurt, cheese, or milk. Beverages Sweetened drinks, such as sweet iced tea and soda. Fats and oils Butter. Lard. Ghee. Sweets and desserts Baked goods, such as cake, cupcakes, pastries, cookies, and cheesecake. Seasoning and other foods Spice mixes with added salt. Ketchup. Barbecue sauce. Mayonnaise. Summary  To prevent diabetes from developing, you may need to make diet and other lifestyle changes to help control blood sugar, improve cholesterol levels, and manage your blood pressure.  Set weight loss goals with the help of your health care team. It is recommended that most people with prediabetes lose 7 percent of their current body weight.  Consider following a Mediterranean diet that includes plenty of fresh fruits and vegetables, whole grains, beans, nuts, seeds, fish, lean meat, low-fat dairy, and healthy oils. This information is not intended to replace advice given to you by your health care provider. Make sure you discuss any questions you have with your health care provider. Document Released: 01/03/2015 Document Revised: 10/23/2016 Document  Reviewed: 10/23/2016 Elsevier Interactive Patient Education  [employer].   If you have lab work done today you will be contacted with your lab results within the next 2 weeks.  If you have not heard from [place] then please contact us. The  fastest way to get your results is to register for My Chart.   IF you received an x-ray today, you will receive an invoice from [facility]. Please contact [facility] at [phone] with questions or concerns regarding your invoice.   IF you received labwork today, you will receive an invoice from [facility]. Please contact [facility] at [phone] with questions or concerns regarding your invoice.   Our billing staff will not be able to assist you with questions regarding bills from these companies.  You will be contacted with the lab results as soon as they are available. The fastest way to get your results is to activate your My Chart account. Instructions are located on the last page of this paperwork. If you have not heard from [place] regarding the results in 2 weeks, please contact this office.       Signed,   Merri Ray, MD [facility].  10/22/18 9:32 AM

## 2018-10-22 NOTE — Patient Instructions (Addendum)
No change in blood pressure meds today. Cough should continue to improve.  Return to the clinic or go to the nearest emergency room if any of your symptoms worsen or new symptoms occur.  See info below on prediabetes. Low intensity exercise most days per week - 165mins per week. Repeat blood sugar test in next 6 months.    Prediabetes Prediabetes is the condition of having a blood sugar (blood glucose) level that is higher than it should be, but not high enough for you to be diagnosed with type 2 diabetes. Having prediabetes puts you at risk for developing type 2 diabetes (type 2 diabetes mellitus). Prediabetes may be called impaired glucose tolerance or impaired fasting glucose. Prediabetes usually does not cause symptoms. Your health care provider can diagnose this condition with blood tests. You may be tested for prediabetes if you are overweight and if you have at least one other risk factor for prediabetes. What is blood glucose, and how is it measured? Blood glucose refers to the amount of glucose in your bloodstream. Glucose comes from eating foods that contain sugars and starches (carbohydrates), which the body breaks down into glucose. Your blood glucose level may be measured in mg/dL (milligrams per deciliter) or mmol/L (millimoles per liter). Your blood glucose may be checked with one or more of the following blood tests:  A fasting blood glucose (FBG) test. You will not be allowed to eat (you will fast) for 8 hours or longer before a blood sample is taken. ? A normal range for FBG is 70-100 mg/dl (3.9-5.6 mmol/L).  An A1c (hemoglobin A1c) blood test. This test provides information about blood glucose control over the previous 2?50months.  An oral glucose tolerance test (OGTT). This test measures your blood glucose at two times: ? After fasting. This is your baseline level. ? Two hours after you drink a beverage that contains glucose. You may be diagnosed with prediabetes:  If your  FBG is 100?125 mg/dL (5.6-6.9 mmol/L).  If your A1c level is 5.7?6.4%.  If your OGTT result is 140?199 mg/dL (7.8-11 mmol/L). These blood tests may be repeated to confirm your diagnosis. How can this condition affect me? The pancreas produces a hormone (insulin) that helps to move glucose from the bloodstream into cells. When cells in the body do not respond properly to insulin that the body makes (insulin resistance), excess glucose builds up in the blood instead of going into cells. As a result, high blood glucose (hyperglycemia) can develop, which can cause many complications. Hyperglycemia is a symptom of prediabetes. Having high blood glucose for a long time is dangerous. Too much glucose in your blood can damage your nerves and blood vessels. Long-term damage can lead to complications from diabetes, which may include:  Heart disease.  Stroke.  Blindness.  Kidney disease.  Depression.  Poor circulation in the feet and legs, which could lead to surgical removal (amputation) in severe cases. What can increase my risk? Risk factors for prediabetes include:  Having a family member with type 2 diabetes.  Being overweight or obese.  Being older than age 15.  Being of American [place], African-American, Hispanic/Latino, or Asian/Pacific Islander descent.  Having an inactive (sedentary) lifestyle.  Having a history of heart disease.  History of gestational diabetes or polycystic ovary syndrome (PCOS), in women.  Having low levels of good cholesterol (HDL-C) or high levels of blood fats (triglycerides).  Having high blood pressure. What actions can I take to prevent diabetes?  Be physically active. ? Do moderate-intensity physical activity for 30 or more minutes on 5 or more days of the week, or as much as told by your health care provider. This could be brisk walking, biking, or water aerobics. ? Ask your health care provider what activities are safe for you. A mix of  physical activities may be best, such as walking, swimming, cycling, and strength training.  Lose weight as told by your health care provider. ? Losing 5-7% of your body weight can reverse insulin resistance. ? Your health care provider can determine how much weight loss is best for you and can help you lose weight safely.  Follow a healthy meal plan. This includes eating lean proteins, complex carbohydrates, fresh fruits and vegetables, low-fat dairy products, and healthy fats. ? Follow instructions from your health care provider about eating or drinking restrictions. ? Make an appointment to see a diet and nutrition specialist (registered dietitian) to help you create a healthy eating plan that is right for you.  Do not smoke or use any tobacco products, such as cigarettes, chewing tobacco, and e-cigarettes. If you need help quitting, ask your health care provider.  Take over-the-counter and prescription medicines as told by your health care provider. You may be prescribed medicines that help lower the risk of type 2 diabetes.  Keep all follow-up visits as told by your health care provider. This is important. Summary  Prediabetes is the condition of having a blood sugar (blood glucose) level that is higher than it should be, but not high enough for you to be diagnosed with type 2 diabetes.  Having prediabetes puts you at risk for developing type 2 diabetes (type 2 diabetes mellitus).  To help prevent type 2 diabetes, make lifestyle changes such as being physically active and eating a healthy diet. Lose weight as told by your health care provider. This information is not intended to replace advice given to you by your health care provider. Make sure you discuss any questions you have with your health care provider. Document Released: 12/11/2015 Document Revised: 04/08/2017 Document Reviewed: 10/10/2015 Elsevier Interactive Patient Education  2019 Elsevier Inc.    Prediabetes Eating  Plan Prediabetes is a condition that causes blood sugar (glucose) levels to be higher than normal. This increases the risk for developing diabetes. In order to prevent diabetes from developing, your health care provider may recommend a diet and other lifestyle changes to help you:  Control your blood glucose levels.  Improve your cholesterol levels.  Manage your blood pressure. Your health care provider may recommend working with a diet and nutrition specialist (dietitian) to make a meal plan that is best for you. What are tips for following this plan? Lifestyle  Set weight loss goals with the help of your health care team. It is recommended that most people with prediabetes lose 7% of their current body weight.  Exercise for at least 30 minutes at least 5 days a week.  Attend a support group or seek ongoing support from a mental health counselor.  Take over-the-counter and prescription medicines only as told by your health care provider. Reading food labels  Read food labels to check the amount of fat, salt (sodium), and sugar in prepackaged foods. Avoid foods that have: ? Saturated fats. ? Trans fats. ? Added sugars.  Avoid foods that have more than 300 milligrams (mg) of sodium per serving. Limit your daily sodium intake to less than 2,300 mg each day. Shopping  Avoid buying pre-made and  processed foods. Cooking  Cook with olive oil. Do not use butter, lard, or ghee.  Bake, broil, grill, or boil foods. Avoid frying. Meal planning   Work with your dietitian to develop an eating plan that is right for you. This may include: ? Tracking how many calories you take in. Use a food diary, notebook, or mobile application to track what you eat at each meal. ? Using the glycemic index (GI) to plan your meals. The index tells you how quickly a food will raise your blood glucose. Choose low-GI foods. These foods take a longer time to raise blood glucose.  Consider following a  Mediterranean diet. This diet includes: ? Several servings each day of fresh fruits and vegetables. ? Eating fish at least twice a week. ? Several servings each day of whole grains, beans, nuts, and seeds. ? Using olive oil instead of other fats. ? Moderate alcohol consumption. ? Eating small amounts of red meat and whole-fat dairy.  If you have high blood pressure, you may need to limit your sodium intake or follow a diet such as the DASH eating plan. DASH is an eating plan that aims to lower high blood pressure. What foods are recommended? The items listed below may not be a complete list. Talk with your dietitian about what dietary choices are best for you. Grains Whole grains, such as whole-wheat or whole-grain breads, crackers, cereals, and pasta. Unsweetened oatmeal. Bulgur. Barley. Quinoa. Brown rice. Corn or whole-wheat flour tortillas or taco shells. Vegetables Lettuce. Spinach. Peas. Beets. Cauliflower. Cabbage. Broccoli. Carrots. Tomatoes. Squash. Eggplant. Herbs. Peppers. Onions. Cucumbers. Brussels sprouts. Fruits Berries. Bananas. Apples. Oranges. Grapes. Papaya. Mango. Pomegranate. Kiwi. Grapefruit. Cherries. Meats and other protein foods Seafood. Poultry without skin. Lean cuts of pork and beef. Tofu. Eggs. Nuts. Beans. Dairy Low-fat or fat-free dairy products, such as yogurt, cottage cheese, and cheese. Beverages Water. Tea. Coffee. Sugar-free or diet soda. Seltzer water. Lowfat or no-fat milk. Milk alternatives, such as soy or almond milk. Fats and oils Olive oil. Canola oil. Sunflower oil. Grapeseed oil. Avocado. Walnuts. Sweets and desserts Sugar-free or low-fat pudding. Sugar-free or low-fat ice cream and other frozen treats. Seasoning and other foods Herbs. Sodium-free spices. Mustard. Relish. Low-fat, low-sugar ketchup. Low-fat, low-sugar barbecue sauce. Low-fat or fat-free mayonnaise. What foods are not recommended? The items listed below may not be a complete  list. Talk with your dietitian about what dietary choices are best for you. Grains Refined white flour and flour products, such as bread, pasta, snack foods, and cereals. Vegetables Canned vegetables. Frozen vegetables with butter or cream sauce. Fruits Fruits canned with syrup. Meats and other protein foods Fatty cuts of meat. Poultry with skin. Breaded or fried meat. Processed meats. Dairy Full-fat yogurt, cheese, or milk. Beverages Sweetened drinks, such as sweet iced tea and soda. Fats and oils Butter. Lard. Ghee. Sweets and desserts Baked goods, such as cake, cupcakes, pastries, cookies, and cheesecake. Seasoning and other foods Spice mixes with added salt. Ketchup. Barbecue sauce. Mayonnaise. Summary  To prevent diabetes from developing, you may need to make diet and other lifestyle changes to help control blood sugar, improve cholesterol levels, and manage your blood pressure.  Set weight loss goals with the help of your health care team. It is recommended that most people with prediabetes lose 7 percent of their current body weight.  Consider following a Mediterranean diet that includes plenty of fresh fruits and vegetables, whole grains, beans, nuts, seeds, fish, lean meat, low-fat dairy, and healthy  oils. This information is not intended to replace advice given to you by your health care provider. Make sure you discuss any questions you have with your health care provider. Document Released: 01/03/2015 Document Revised: 10/23/2016 Document Reviewed: 10/23/2016 Elsevier Interactive Patient Education  [employer].   If you have lab work done today you will be contacted with your lab results within the next 2 weeks.  If you have not heard from [place] then please contact us. The fastest way to get your results is to register for My Chart.   IF you received an x-ray today, you will receive an invoice from [facility]. Please contact [facility] at  [phone] with questions or concerns regarding your invoice.   IF you received labwork today, you will receive an invoice from [facility]. Please contact [facility] at [phone] with questions or concerns regarding your invoice.   Our billing staff will not be able to assist you with questions regarding bills from these companies.  You will be contacted with the lab results as soon as they are available. The fastest way to get your results is to activate your My Chart account. Instructions are located on the last page of this paperwork. If you have not heard from [place] regarding the results in 2 weeks, please contact this office.

## 2019-04-26 ENCOUNTER — Ambulatory Visit (INDEPENDENT_AMBULATORY_CARE_PROVIDER_SITE_OTHER): Payer: Medicare HMO | Admitting: Family Medicine

## 2019-04-26 ENCOUNTER — Other Ambulatory Visit: Payer: Self-pay

## 2019-04-26 ENCOUNTER — Encounter: Payer: Self-pay | Admitting: Family Medicine

## 2019-04-26 VITALS — BP 119/76 | HR 91 | Temp 97.9°F | Resp 14 | Wt 207.8 lb

## 2019-04-26 DIAGNOSIS — E785 Hyperlipidemia, unspecified: Secondary | ICD-10-CM

## 2019-04-26 DIAGNOSIS — R7303 Prediabetes: Secondary | ICD-10-CM | POA: Diagnosis not present

## 2019-04-26 DIAGNOSIS — I10 Essential (primary) hypertension: Secondary | ICD-10-CM | POA: Diagnosis not present

## 2019-04-26 MED ORDER — LOSARTAN POTASSIUM-HCTZ 50-12.5 MG PO TABS: 1.0000 | ORAL_TABLET | Freq: Every day | ORAL | 1 refills | Status: DC

## 2019-04-26 MED ORDER — SIMVASTATIN 20 MG PO TABS: 20.0000 mg | ORAL_TABLET | Freq: Every day | ORAL | 3 refills | Status: DC

## 2019-04-26 NOTE — Patient Instructions (Addendum)
No change in medications at this time.  Restart walking most days per week as you are able, and I expect that to continue to help with blood sugar, cholesterol and blood pressure.  Additionally if you do not see improvement in the atrophy, or any worsening symptoms, would recommend follow-up with your neurologist.  Please let me know how I can help further.  I do recommend flu vaccine as soon as you are ready.  Thank you for coming in today.  Take care.     If you have lab work done today you will be contacted with your lab results within the next 2 weeks.  If you have not heard from [place] then please contact us. The fastest way to get your results is to register for My Chart.   IF you received an x-ray today, you will receive an invoice from [facility]. Please contact [facility] at [phone] with questions or concerns regarding your invoice.   IF you received labwork today, you will receive an invoice from [facility]. Please contact [facility] at [phone] with questions or concerns regarding your invoice.   Our billing staff will not be able to assist you with questions regarding bills from these companies.  You will be contacted with the lab results as soon as they are available. The fastest way to get your results is to activate your My Chart account. Instructions are located on the last page of this paperwork. If you have not heard from [place] regarding the results in 2 weeks, please contact this office.

## 2019-04-26 NOTE — Progress Notes (Signed)
Subjective:    Patient ID: Travis Ellis, male    DOB: 29-Oct-1955, 63 y.o.   MRN: [id number]  HPI Travis Ellis is a 63 y.o. male Presents today for: Chief Complaint  Patient presents with  . chronic medical condition    here to f/u on his pre diabetes and hypertension   Prediabetes: Diet/exercise discussed at last visit in February.  6 pound weight loss since last visit. Less active. Some walking. No diet changes.  Occasional fried/fast food.  Half/half sweet tea- more unsweet tea.  2-3 sodas per week.   Hx of distal hereditary motor neuropathy, seen by Dr. [name] few years ago.  No FH of same issue.  Atrophy in muscles waxes and wanes - improves or maintains with walking/more activity. S/p PT few years ago.    Wt Readings from Last 3 Encounters:  04/26/19 207 lb 12.8 oz (94.3 kg)  10/22/18 213 lb 9.6 oz (96.9 kg)  09/07/18 212 lb 12.8 oz (96.5 kg)    Lab Results  Component Value Date   HGBA1C 6.2 (H) 09/18/2018   HGBA1C CANCELED 09/07/2018   HGBA1C 5.8 (H) 02/23/2018   Lab Results  Component Value Date   LDLCALC 87 09/07/2018   CREATININE 0.61 (L) 09/07/2018   Hypertension: BP Readings from Last 3 Encounters:  04/26/19 119/76  10/22/18 107/73  09/07/18 126/80   Lab Results  Component Value Date   CREATININE 0.61 (L) 09/07/2018  Losartan HCTZ 50/12.5 mg daily.  No recent home readings, but normal at other offices.  No new side effects.   Hyperlipidemia:  Lab Results  Component Value Date   CHOL 148 09/07/2018   HDL 40 (L) 09/07/2018   LDLCALC 87 09/07/2018   TRIG 118 09/07/2018   CHOLHDL 3.7 09/07/2018   Lab Results  Component Value Date   ALT 36 09/07/2018   AST 22 09/07/2018   ALKPHOS CANCELED 08/27/2017   BILITOT 0.8 09/07/2018  Zocor 20 mg daily, also taking aspirin 81 mg daily. Discussed risks/benefits of ASA, and chooses to remain on tx.  No new myalgias.   Declines flu shot today - plan on getting this later.   Patient Active  Problem List   Diagnosis Date Noted  . Left rotator cuff tear 10/22/2018  . Risk for coronary artery disease between 10% and 20% in next 10 years 11/27/2016  . Prostate cancer ([place]) 09/14/2012  . Abnormal gait 05/21/2012  . HTN (hypertension) 02/03/2012  . Neuropathy, leg 02/03/2012   Past Medical History:  Diagnosis Date  . Allergy   . Arthritis   . Cancer [facility]) 2008   prostate  . GERD (gastroesophageal reflux disease)   . Hyperlipidemia   . Hypertension   . Neuromuscular disorder ([place])   . Prostate cancer ([place])    [id number] adenocarcinoma of the prostate   Past Surgical History:  Procedure Laterality Date  . CHOLECYSTECTOMY N/A 03/16/2015   Procedure: LAPAROSCOPIC CHOLECYSTECTOMY WITH INTRAOPERATIVE CHOLANGIOGRAM;  Surgeon: [name], MD;  Location: [facility] ORS;  Service: General;  Laterality: N/A;  . JOINT REPLACEMENT Right 08/2009   knee  . REPLACEMENT TOTAL KNEE  2010   right knee  . ROTATOR CUFF REPAIR Left 06/21/2015   No Known Allergies Prior to Admission medications   Medication Sig Start Date End Date Taking? Authorizing Provider  aspirin EC 81 MG tablet Take 1 tablet (81 mg total) by mouth daily. 02/23/18   Tereasa Coop, PA-C  losartan-hydrochlorothiazide (HYZAAR) 50-12.5 MG tablet Take 1 tablet  by mouth daily. 10/22/18   Wendie Agreste, MD  Multiple Vitamin (MULTIVITAMIN) tablet Take 1 tablet by mouth daily.    [provider]  simvastatin (ZOCOR) 20 MG tablet Take 1 tablet (20 mg total) by mouth at bedtime. 09/07/18   Wendie Agreste, MD   Social History   Socioeconomic History  . Marital status: Married    Spouse name: Not on file  . Number of children: Not on file  . Years of education: Not on file  . Highest education level: Not on file  Occupational History  . Not on file  Social Needs  . Financial resource strain: Not on file  . Food insecurity    Worry: Not on file    Inability: Not on file  . Transportation needs    Medical: Not  on file    Non-medical: Not on file  Tobacco Use  . Smoking status: Never Smoker  . Smokeless tobacco: Never Used  Substance and Sexual Activity  . Alcohol use: Yes    Comment: rarely  . Drug use: No  . Sexual activity: Yes  Lifestyle  . Physical activity    Days per week: Not on file    Minutes per session: Not on file  . Stress: Not on file  Relationships  . Social Herbalist on phone: Not on file    Gets together: Not on file    Attends religious service: Not on file    Active member of club or organization: Not on file    Attends meetings of clubs or organizations: Not on file    Relationship status: Not on file  . Intimate partner violence    Fear of current or ex partner: Not on file    Emotionally abused: Not on file    Physically abused: Not on file    Forced sexual activity: Not on file  Other Topics Concern  . Not on file  Social History Narrative   Exercise walking daily for 1 mile    Review of Systems Per HPI    Objective:   Physical Exam Vitals signs reviewed.  Constitutional:      Appearance: He is well-developed.  HENT:     Head: Normocephalic and atraumatic.  Eyes:     Pupils: Pupils are equal, round, and reactive to light.  Neck:     Vascular: No carotid bruit or JVD.  Cardiovascular:     Rate and Rhythm: Normal rate and regular rhythm.     Heart sounds: Normal heart sounds. No murmur.  Pulmonary:     Effort: Pulmonary effort is normal.     Breath sounds: Normal breath sounds. No rales.  Skin:    General: Skin is warm and dry.  Neurological:     Mental Status: He is alert and oriented to person, place, and time.    Vitals:   04/26/19 0827  BP: 119/76  Pulse: 91  Resp: 14  Temp: 97.9 F (36.6 C)  TempSrc: Oral  SpO2: 97%  Weight: 207 lb 12.8 oz (94.3 kg)       Assessment & Plan:  Travis Ellis is a 63 y.o. male Prediabetes - Plan: Hemoglobin A1c  -Check A1c, plan to increase in walking, which should also help his  history of motor neuropathy/atrophy.  Neuro follow-up discussed if persistent atrophy or new symptoms.   Essential hypertension - Plan: Comprehensive metabolic panel -  Stable, tolerating current regimen. Medications refilled. Labs pending as above.  Hyperlipidemia, unspecified hyperlipidemia type - Plan: Comprehensive metabolic panel, Lipid Panel  -  Stable, tolerating current regimen. Medications refilled. Labs pending as above.    No orders of the defined types were placed in this encounter.  Patient Instructions   No change in medications at this time.  Restart walking most days per week as you are able, and I expect that to continue to help with blood sugar, cholesterol and blood pressure.  Additionally if you do not see improvement in the atrophy, or any worsening symptoms, would recommend follow-up with your neurologist.  Please let me know how I can help further.  I do recommend flu vaccine as soon as you are ready.  Thank you for coming in today.  Take care.     If you have lab work done today you will be contacted with your lab results within the next 2 weeks.  If you have not heard from [place] then please contact us. The fastest way to get your results is to register for My Chart.   IF you received an x-ray today, you will receive an invoice from [facility]. Please contact [facility] at [phone] with questions or concerns regarding your invoice.   IF you received labwork today, you will receive an invoice from [facility]. Please contact [facility] at [phone] with questions or concerns regarding your invoice.   Our billing staff will not be able to assist you with questions regarding bills from these companies.  You will be contacted with the lab results as soon as they are available. The fastest way to get your results is to activate your My Chart account. Instructions are located on the last page of this paperwork. If you have not heard from [place] regarding  the results in 2 weeks, please contact this office.

## 2019-04-27 LAB — COMPREHENSIVE METABOLIC PANEL
ALT: 35 IU/L (ref 0–44)
AST: 28 IU/L (ref 0–40)
Albumin/Globulin Ratio: 2 (ref 1.2–2.2)
Albumin: 4.5 g/dL (ref 3.8–4.8)
Alkaline Phosphatase: 47 IU/L (ref 39–117)
BUN/Creatinine Ratio: 29 — ABNORMAL HIGH (ref 10–24)
BUN: 20 mg/dL (ref 8–27)
Bilirubin Total: 0.7 mg/dL (ref 0.0–1.2)
CO2: 26 mmol/L (ref 20–29)
Calcium: 9.8 mg/dL (ref 8.6–10.2)
Chloride: 100 mmol/L (ref 96–106)
Creatinine, Ser: 0.69 mg/dL — ABNORMAL LOW (ref 0.76–1.27)
GFR calc Af Amer: 116 mL/min/{1.73_m2} (ref >59)
GFR calc non Af Amer: 100 mL/min/{1.73_m2} (ref >59)
Globulin, Total: 2.3 g/dL (ref 1.5–4.5)
Glucose: 120 mg/dL — ABNORMAL HIGH (ref 65–99)
Potassium: 4.8 mmol/L (ref 3.5–5.2)
Sodium: 142 mmol/L (ref 134–144)
Total Protein: 6.8 g/dL (ref 6.0–8.5)

## 2019-04-27 LAB — LIPID PANEL
Chol/HDL Ratio: 3.3 ratio (ref 0.0–5.0)
Cholesterol, Total: 127 mg/dL (ref 100–199)
HDL: 39 mg/dL — ABNORMAL LOW (ref >39)
LDL Calculated: 67 mg/dL (ref 0–99)
Triglycerides: 104 mg/dL (ref 0–149)
VLDL Cholesterol Cal: 21 mg/dL (ref 5–40)

## 2019-04-27 LAB — HEMOGLOBIN A1C
Est. average glucose Bld gHb Est-mCnc: 120 mg/dL
Hgb A1c MFr Bld: 5.8 % — ABNORMAL HIGH (ref 4.8–5.6)

## 2019-05-24 DIAGNOSIS — R69 Illness, unspecified: Secondary | ICD-10-CM | POA: Diagnosis not present

## 2019-06-02 DIAGNOSIS — R69 Illness, unspecified: Secondary | ICD-10-CM | POA: Diagnosis not present

## 2019-10-20 ENCOUNTER — Encounter: Payer: Self-pay | Admitting: Family Medicine

## 2019-10-20 ENCOUNTER — Ambulatory Visit (INDEPENDENT_AMBULATORY_CARE_PROVIDER_SITE_OTHER): Payer: Medicare HMO | Admitting: Family Medicine

## 2019-10-20 ENCOUNTER — Other Ambulatory Visit: Payer: Self-pay

## 2019-10-20 VITALS — BP 119/77 | HR 96 | Temp 97.5°F | Ht 73.0 in | Wt 208.0 lb

## 2019-10-20 DIAGNOSIS — Z0000 Encounter for general adult medical examination without abnormal findings: Secondary | ICD-10-CM | POA: Diagnosis not present

## 2019-10-20 DIAGNOSIS — E785 Hyperlipidemia, unspecified: Secondary | ICD-10-CM

## 2019-10-20 DIAGNOSIS — I10 Essential (primary) hypertension: Secondary | ICD-10-CM

## 2019-10-20 DIAGNOSIS — D2230 Melanocytic nevi of unspecified part of face: Secondary | ICD-10-CM

## 2019-10-20 DIAGNOSIS — R7303 Prediabetes: Secondary | ICD-10-CM | POA: Diagnosis not present

## 2019-10-20 MED ORDER — LOSARTAN POTASSIUM-HCTZ 50-12.5 MG PO TABS: 1.0000 | ORAL_TABLET | Freq: Every day | ORAL | 1 refills | Status: DC

## 2019-10-20 MED ORDER — SIMVASTATIN 20 MG PO TABS: 20.0000 mg | ORAL_TABLET | Freq: Every day | ORAL | 3 refills | Status: DC

## 2019-10-20 NOTE — Patient Instructions (Addendum)
Please check with your pharmacy to see if you have been Shingrix or shingles vaccine as well as pneumonia vaccines.  Pneumonia vaccine is usually recommended at age 66, shingles vaccine after age 51.  Send me a  my chart email regarding the condition you have researched, and schedule visit to discuss muscle symptoms/neuropathy symptoms further if possible.   No change in medicines for now, I will check some lab work today.  I will refer you to dermatology.  Thanks for coming in today.  Let me know if there are questions   Preventive Care 27 Years and Older, Male Preventive care refers to lifestyle choices and visits with your health care provider that can promote health and wellness. This includes:  A yearly physical exam. This is also called an annual well check.  Regular dental and eye exams.  Immunizations.  Screening for certain conditions.  Healthy lifestyle choices, such as diet and exercise. What can I expect for my preventive care visit? Physical exam Your health care provider will check:  Height and weight. These may be used to calculate body mass index (BMI), which is a measurement that tells if you are at a healthy weight.  Heart rate and blood pressure.  Your skin for abnormal spots. Counseling Your health care provider may ask you questions about:  Alcohol, tobacco, and drug use.  Emotional well-being.  Home and relationship well-being.  Sexual activity.  Eating habits.  History of falls.  Memory and ability to understand (cognition).  Work and work Statistician. What immunizations do I need?  Influenza (flu) vaccine  This is recommended every year. Tetanus, diphtheria, and pertussis (Tdap) vaccine  You may need a Td booster every 10 years. Varicella (chickenpox) vaccine  You may need this vaccine if you have not already been vaccinated. Zoster (shingles) vaccine  You may need this after age 28. Pneumococcal conjugate (PCV13) vaccine  One  dose is recommended after age 57. Pneumococcal polysaccharide (PPSV23) vaccine  One dose is recommended after age 26. Measles, mumps, and rubella (MMR) vaccine  You may need at least one dose of MMR if you were born in 1957 or later. You may also need a second dose. Meningococcal conjugate (MenACWY) vaccine  You may need this if you have certain conditions. Hepatitis A vaccine  You may need this if you have certain conditions or if you travel or work in places where you may be exposed to hepatitis A. Hepatitis B vaccine  You may need this if you have certain conditions or if you travel or work in places where you may be exposed to hepatitis B. Haemophilus influenzae type b (Hib) vaccine  You may need this if you have certain conditions. You may receive vaccines as individual doses or as more than one vaccine together in one shot (combination vaccines). Talk with your health care provider about the risks and benefits of combination vaccines. What tests do I need? Blood tests  Lipid and cholesterol levels. These may be checked every 5 years, or more frequently depending on your overall health.  Hepatitis C test.  Hepatitis B test. Screening  Lung cancer screening. You may have this screening every year starting at age 88 if you have a 30-pack-year history of smoking and currently smoke or have quit within the past 15 years.  Colorectal cancer screening. All adults should have this screening starting at age 32 and continuing until age 95. Your health care provider may recommend screening at age 36 if you are at  increased risk. You will have tests every 1-10 years, depending on your results and the type of screening test.  Prostate cancer screening. Recommendations will vary depending on your family history and other risks.  Diabetes screening. This is done by checking your blood sugar (glucose) after you have not eaten for a while (fasting). You may have this done every 1-3  years.  Abdominal aortic aneurysm (AAA) screening. You may need this if you are a current or former smoker.  Sexually transmitted disease (STD) testing. Follow these instructions at home: Eating and drinking  Eat a diet that includes fresh fruits and vegetables, whole grains, lean protein, and low-fat dairy products. Limit your intake of foods with high amounts of sugar, saturated fats, and salt.  Take vitamin and mineral supplements as recommended by your health care provider.  Do not drink alcohol if your health care provider tells you not to drink.  If you drink alcohol: ? Limit how much you have to 0-2 drinks a day. ? Be aware of how much alcohol is in your drink. In the [place] one drink equals one 12 oz bottle of beer (355 mL), one 5 oz glass of wine (148 mL), or one 1 oz glass of hard liquor (44 mL). Lifestyle  Take daily care of your teeth and gums.  Stay active. Exercise for at least 30 minutes on 5 or more days each week.  Do not use any products that contain nicotine or tobacco, such as cigarettes, e-cigarettes, and chewing tobacco. If you need help quitting, ask your health care provider.  If you are sexually active, practice safe sex. Use a condom or other form of protection to prevent STIs (sexually transmitted infections).  Talk with your health care provider about taking a low-dose aspirin or statin. What's next?  Visit your health care provider once a year for a well check visit.  Ask your health care provider how often you should have your eyes and teeth checked.  Stay up to date on all vaccines. This information is not intended to replace advice given to you by your health care provider. Make sure you discuss any questions you have with your health care provider. Document Revised: 08/13/2018 Document Reviewed: 08/13/2018 Elsevier Patient Education  [employer].     If you have lab work done today you will be contacted with your lab results within the  next 2 weeks.  If you have not heard from [place] then please contact us. The fastest way to get your results is to register for My Chart.   IF you received an x-ray today, you will receive an invoice from [facility]. Please contact [facility] at [phone] with questions or concerns regarding your invoice.   IF you received labwork today, you will receive an invoice from [facility]. Please contact [facility] at [phone] with questions or concerns regarding your invoice.   Our billing staff will not be able to assist you with questions regarding bills from these companies.  You will be contacted with the lab results as soon as they are available. The fastest way to get your results is to activate your My Chart account. Instructions are located on the last page of this paperwork. If you have not heard from [place] regarding the results in 2 weeks, please contact this office.

## 2019-10-20 NOTE — Progress Notes (Signed)
Subjective:  Patient ID: Travis Ellis, male    DOB: 02-17-1956  Age: 64 y.o. MRN: [id number]  CC:  Chief Complaint  Patient presents with  . Medicare Wellness    pt states he feels good with no complaints. pt states medications are working well with no side effects.    HPI Travis Ellis presents for   Medicare wellness exam - welcome to medicare and  follow-up of chronic conditions  Care team: Urology Dr. Alinda Money Primary care provider, me Neurology: [place] for neuropathy - no recent visit.  [facility] opt[facility].   Hypertension: Losartan HCTZ 50/12.5 mg daily.  Home readings:none. Ok at outside offices. No new side effects.   BP Readings from Last 3 Encounters:  10/20/19 119/77  04/26/19 119/76  10/22/18 107/73   Lab Results  Component Value Date   CREATININE 0.69 (L) 04/26/2019    Hyperlipidemia: Simvastatin 20 mg daily. No new myalgias or new side effects.  Lab Results  Component Value Date   CHOL 127 04/26/2019   HDL 39 (L) 04/26/2019   LDLCALC 67 04/26/2019   TRIG 104 04/26/2019   CHOLHDL 3.3 04/26/2019   Lab Results  Component Value Date   ALT 35 04/26/2019   AST 28 04/26/2019   ALKPHOS 47 04/26/2019   BILITOT 0.7 04/26/2019   Prostate cancer Radical prostatectomy 2008 biochemical recurrence November 2014, salvage radiation therapy with Dr. Tammi Klippel.  Undetectable PSA following radiation, July 2020 visit with urology reviewed.  Thought to have radiation cystitis as cause of hematuria previously.  Appointment this July.  Urologist Dr. Alinda Money.  Prediabetes: Lab Results  Component Value Date   HGBA1C 5.8 (H) 04/26/2019   Wt Readings from Last 3 Encounters:  10/20/19 208 lb (94.3 kg)  04/26/19 207 lb 12.8 oz (94.3 kg)  10/22/18 213 lb 9.6 oz (96.9 kg)  exercise: daily.  Minimal soda - few per week.  1/2 sweet/unsweet tea.    Fall Risk  10/20/2019 04/26/2019 10/22/2018 10/22/2018 09/07/2018  Falls in the past year? 0 0 1 0 1  Number falls in  past yr: 0 0 0 0 0  Injury with Fall? 0 0 0 0 0  Comment - - - - -  Follow up Falls evaluation completed Falls evaluation completed Falls evaluation completed Falls evaluation completed -  stairs - 3 into home. Stairs up and down in house, usually main level, has handrail.  Adequate lighting. No loose rugs. Has grab bar in bathroom, shower.    Depression screen [facility] 2/9 10/20/2019 04/26/2019 10/22/2018 09/07/2018 02/23/2018  Decreased Interest 0 0 0 0 0  Down, Depressed, Hopeless 0 0 0 0 0  PHQ - 2 Score 0 0 0 0 0   Cancer screening: Colonoscopy 07/05/2011, repeat 10 years Prostate cancer treated by urology as above Dermatology - no derm. Has few moles on left face. About same size?   Immunization History  Administered Date(s) Administered  . Influenza Split 09/06/2015  . Influenza-Unspecified 05/27/2018  . Tdap 05/10/2015  shingles: unknown. May have had this and pneumonia through pharmacy.  covid 19 vaccine - had 1st shot.   Functional Status Survey: Is the patient deaf or have difficulty hearing?: No Does the patient have difficulty seeing, even when wearing glasses/contacts?: No Does the patient have difficulty concentrating, remembering, or making decisions?: No Does the patient have difficulty walking or climbing stairs?: Yes Does the patient have difficulty dressing or bathing?: No Does the patient have difficulty doing errands alone such as visiting  a doctor's office or shopping?: No Difficulty with stairs with neuropathy, mm loss.    6CIT Screen 10/20/2019 09/07/2018  What Year? 0 points 0 points  What month? 0 points 0 points  What time? 0 points 0 points  Count back from 20 0 points 0 points  Months in reverse 0 points 0 points  Repeat phrase 0 points 0 points  Total Score 0 0   Advanced directives, does not currently have, info provided.     Office Visit from 10/20/2019 in Primary Care at [facility]  AUDIT-C Score  1     .  Hearing Screening   125Hz 250Hz 500Hz 1000Hz  2000Hz 3000Hz 4000Hz 6000Hz 8000Hz  Right ear:           Left ear:             Visual Acuity Screening   Right eye Left eye Both eyes  Without correction:     With correction: 20/20 20/20 20/20  optometry as above. Regular follow up.   Dental: every 6 months in [place].   Exercise:as above - walking as above. 2000 steps per day.     History Patient Active Problem List   Diagnosis Date Noted  . Left rotator cuff tear 10/22/2018  . Risk for coronary artery disease between 10% and 20% in next 10 years 11/27/2016  . Prostate cancer ([place]) 09/14/2012  . Abnormal gait 05/21/2012  . HTN (hypertension) 02/03/2012  . Neuropathy, leg 02/03/2012   Past Medical History:  Diagnosis Date  . Allergy   . Arthritis   . Cancer [facility]) 2008   prostate  . GERD (gastroesophageal reflux disease)   . Hyperlipidemia   . Hypertension   . Neuromuscular disorder ([place])   . Prostate cancer ([place])    [id number] adenocarcinoma of the prostate   Past Surgical History:  Procedure Laterality Date  . CHOLECYSTECTOMY N/A 03/16/2015   Procedure: LAPAROSCOPIC CHOLECYSTECTOMY WITH INTRAOPERATIVE CHOLANGIOGRAM;  Surgeon: [name], MD;  Location: [facility] ORS;  Service: General;  Laterality: N/A;  . JOINT REPLACEMENT Right 08/2009   knee  . REPLACEMENT TOTAL KNEE  2010   right knee  . ROTATOR CUFF REPAIR Left 06/21/2015   No Known Allergies Prior to Admission medications   Medication Sig Start Date End Date Taking? Authorizing Provider  aspirin EC 81 MG tablet Take 1 tablet (81 mg total) by mouth daily. 02/23/18   Tereasa Coop, PA-C  losartan-hydrochlorothiazide (HYZAAR) 50-12.5 MG tablet Take 1 tablet by mouth daily. 04/26/19   Wendie Agreste, MD  Multiple Vitamin (MULTIVITAMIN) tablet Take 1 tablet by mouth daily.    [provider]  simvastatin (ZOCOR) 20 MG tablet Take 1 tablet (20 mg total) by mouth at bedtime. 04/26/19   Wendie Agreste, MD   Social History   Socioeconomic History  .  Marital status: Married    Spouse name: Not on file  . Number of children: Not on file  . Years of education: Not on file  . Highest education level: Not on file  Occupational History  . Not on file  Tobacco Use  . Smoking status: Never Smoker  . Smokeless tobacco: Never Used  Substance and Sexual Activity  . Alcohol use: Yes    Comment: rarely  . Drug use: No  . Sexual activity: Yes  Other Topics Concern  . Not on file  Social History Narrative   Exercise walking daily for 1 mile   Social Determinants of Health   Financial  Resource Strain:   . Difficulty of Paying Living Expenses: Not on file  Food Insecurity:   . Worried About Charity fundraiser in the Last Year: Not on file  . Ran Out of Food in the Last Year: Not on file  Transportation Needs:   . Lack of Transportation (Medical): Not on file  . Lack of Transportation (Non-Medical): Not on file  Physical Activity:   . Days of Exercise per Week: Not on file  . Minutes of Exercise per Session: Not on file  Stress:   . Feeling of Stress : Not on file  Social Connections:   . Frequency of Communication with Friends and Family: Not on file  . Frequency of Social Gatherings with Friends and Family: Not on file  . Attends Religious Services: Not on file  . Active Member of Clubs or Organizations: Not on file  . Attends Archivist Meetings: Not on file  . Marital Status: Not on file  Intimate Partner Violence:   . Fear of Current or Ex-Partner: Not on file  . Emotionally Abused: Not on file  . Physically Abused: Not on file  . Sexually Abused: Not on file    Review of Systems  13 point review of systems per patient health survey noted.  Negative other than as indicated above or in HPI.   Objective:   Vitals:   10/20/19 0919  BP: 119/77  Pulse: 96  Temp: (!) 97.5 F (36.4 C)  TempSrc: Temporal  SpO2: 99%  Weight: 208 lb (94.3 kg)  Height: 6' 1" (1.854 m)     Physical Exam Vitals reviewed.    Constitutional:      Appearance: He is well-developed.  HENT:     Head: Normocephalic and atraumatic.     Right Ear: External ear normal.     Left Ear: External ear normal.  Eyes:     Conjunctiva/sclera: Conjunctivae normal.     Pupils: Pupils are equal, round, and reactive to light.  Neck:     Thyroid: No thyromegaly.  Cardiovascular:     Rate and Rhythm: Normal rate and regular rhythm.     Heart sounds: Normal heart sounds.  Pulmonary:     Effort: Pulmonary effort is normal. No respiratory distress.     Breath sounds: Normal breath sounds. No wheezing.  Abdominal:     General: There is no distension.     Palpations: Abdomen is soft.     Tenderness: There is no abdominal tenderness.  Musculoskeletal:        General: No tenderness. Normal range of motion.     Cervical back: Normal range of motion and neck supple.     Right lower leg: No edema.     Left lower leg: No edema.  Lymphadenopathy:     Cervical: No cervical adenopathy.  Skin:    General: Skin is warm and dry.     Comments: Few melanocytic/hyperpigmented nevi left face at temporal area, lateral to eye.  Largest approximately 1 cm, others between 5 mm and 1 cm.  No surrounding erythema, no discharge, no appreciable scaling.  Neurological:     Mental Status: He is alert and oriented to person, place, and time.     Deep Tendon Reflexes: Reflexes are normal and symmetric.  Psychiatric:        Behavior: Behavior normal.      Assessment & Plan:  Travis Ellis is a 64 y.o. male . Welcome to Medicare preventive visit  - -  anticipatory guidance as below in AVS, screening labs if needed. Health maintenance items as above in HPI discussed/recommended as applicable.  - no concerning responses on depression, fall, or functional status screening. Any positive responses noted as above. Advanced directives discussed as in [facility].   -He will check with pharmacy regarding pneumonia vaccines, shingles vaccine.  Status post first  Covid vaccine.  Prediabetes - Plan: Hemoglobin A1c  -commended on exercise, continue walking as tolerated with previous neuropathy.  Continue to avoid sugar-containing beverages as much as possible, repeat labs.  Essential hypertension - Plan: Comprehensive metabolic panel Hypertension, well controlled - Plan: losartan-hydrochlorothiazide (HYZAAR) 50-12.5 MG tablet   -Stable, continue same regimen, meds refilled.  Hyperlipidemia, unspecified hyperlipidemia type - Plan: Comprehensive metabolic panel, Lipid panel Dyslipidemia - Plan: simvastatin (ZOCOR) 20 MG tablet  -Check labs, tolerating statin.  Continue same  Melanocytic nevi of face - Plan: Ambulatory referral to Dermatology  -Previous sun exposure with driving.  Refer to dermatology for evaluation.  Suspect seborrheic keratosis but may need biopsied.    Meds ordered this encounter  Medications  . losartan-hydrochlorothiazide (HYZAAR) 50-12.5 MG tablet    Sig: Take 1 tablet by mouth daily.    Dispense:  90 tablet    Refill:  1  . simvastatin (ZOCOR) 20 MG tablet    Sig: Take 1 tablet (20 mg total) by mouth at bedtime.    Dispense:  90 tablet    Refill:  3   Patient Instructions   Please check with your pharmacy to see if you have been Shingrix or shingles vaccine as well as pneumonia vaccines.  Pneumonia vaccine is usually recommended at age 70, shingles vaccine after age 96.  Send me a  my chart email regarding the condition you have researched, and schedule visit to discuss muscle symptoms/neuropathy symptoms further if possible.   No change in medicines for now, I will check some lab work today.  I will refer you to dermatology.  Thanks for coming in today.  Let me know if there are questions   Preventive Care 31 Years and Older, Male Preventive care refers to lifestyle choices and visits with your health care provider that can promote health and wellness. This includes:  A yearly physical exam. This is also called  an annual well check.  Regular dental and eye exams.  Immunizations.  Screening for certain conditions.  Healthy lifestyle choices, such as diet and exercise. What can I expect for my preventive care visit? Physical exam Your health care provider will check:  Height and weight. These may be used to calculate body mass index (BMI), which is a measurement that tells if you are at a healthy weight.  Heart rate and blood pressure.  Your skin for abnormal spots. Counseling Your health care provider may ask you questions about:  Alcohol, tobacco, and drug use.  Emotional well-being.  Home and relationship well-being.  Sexual activity.  Eating habits.  History of falls.  Memory and ability to understand (cognition).  Work and work Statistician. What immunizations do I need?  Influenza (flu) vaccine  This is recommended every year. Tetanus, diphtheria, and pertussis (Tdap) vaccine  You may need a Td booster every 10 years. Varicella (chickenpox) vaccine  You may need this vaccine if you have not already been vaccinated. Zoster (shingles) vaccine  You may need this after age 40. Pneumococcal conjugate (PCV13) vaccine  One dose is recommended after age 74. Pneumococcal polysaccharide (PPSV23) vaccine  One dose is recommended after age  106. Measles, mumps, and rubella (MMR) vaccine  You may need at least one dose of MMR if you were born in 1957 or later. You may also need a second dose. Meningococcal conjugate (MenACWY) vaccine  You may need this if you have certain conditions. Hepatitis A vaccine  You may need this if you have certain conditions or if you travel or work in places where you may be exposed to hepatitis A. Hepatitis B vaccine  You may need this if you have certain conditions or if you travel or work in places where you may be exposed to hepatitis B. Haemophilus influenzae type b (Hib) vaccine  You may need this if you have certain  conditions. You may receive vaccines as individual doses or as more than one vaccine together in one shot (combination vaccines). Talk with your health care provider about the risks and benefits of combination vaccines. What tests do I need? Blood tests  Lipid and cholesterol levels. These may be checked every 5 years, or more frequently depending on your overall health.  Hepatitis C test.  Hepatitis B test. Screening  Lung cancer screening. You may have this screening every year starting at age 67 if you have a 30-pack-year history of smoking and currently smoke or have quit within the past 15 years.  Colorectal cancer screening. All adults should have this screening starting at age 61 and continuing until age 24. Your health care provider may recommend screening at age 57 if you are at increased risk. You will have tests every 1-10 years, depending on your results and the type of screening test.  Prostate cancer screening. Recommendations will vary depending on your family history and other risks.  Diabetes screening. This is done by checking your blood sugar (glucose) after you have not eaten for a while (fasting). You may have this done every 1-3 years.  Abdominal aortic aneurysm (AAA) screening. You may need this if you are a current or former smoker.  Sexually transmitted disease (STD) testing. Follow these instructions at home: Eating and drinking  Eat a diet that includes fresh fruits and vegetables, whole grains, lean protein, and low-fat dairy products. Limit your intake of foods with high amounts of sugar, saturated fats, and salt.  Take vitamin and mineral supplements as recommended by your health care provider.  Do not drink alcohol if your health care provider tells you not to drink.  If you drink alcohol: ? Limit how much you have to 0-2 drinks a day. ? Be aware of how much alcohol is in your drink. In the [place] one drink equals one 12 oz bottle of beer (355 mL), one  5 oz glass of wine (148 mL), or one 1 oz glass of hard liquor (44 mL). Lifestyle  Take daily care of your teeth and gums.  Stay active. Exercise for at least 30 minutes on 5 or more days each week.  Do not use any products that contain nicotine or tobacco, such as cigarettes, e-cigarettes, and chewing tobacco. If you need help quitting, ask your health care provider.  If you are sexually active, practice safe sex. Use a condom or other form of protection to prevent STIs (sexually transmitted infections).  Talk with your health care provider about taking a low-dose aspirin or statin. What's next?  Visit your health care provider once a year for a well check visit.  Ask your health care provider how often you should have your eyes and teeth checked.  Stay up to date  on all vaccines. This information is not intended to replace advice given to you by your health care provider. Make sure you discuss any questions you have with your health care provider. Document Revised: 08/13/2018 Document Reviewed: 08/13/2018 Elsevier Patient Education  [employer].     If you have lab work done today you will be contacted with your lab results within the next 2 weeks.  If you have not heard from [place] then please contact us. The fastest way to get your results is to register for My Chart.   IF you received an x-ray today, you will receive an invoice from [facility]. Please contact [facility] at [phone] with questions or concerns regarding your invoice.   IF you received labwork today, you will receive an invoice from [place]. Please contact [facility] at [phone] with questions or concerns regarding your invoice.   Our billing staff will not be able to assist you with questions regarding bills from these companies.  You will be contacted with the lab results as soon as they are available. The fastest way to get your results is to activate your My Chart account.  Instructions are located on the last page of this paperwork. If you have not heard from [place] regarding the results in 2 weeks, please contact this office.         Signed, Merri Ray, MD [facility]

## 2019-10-21 LAB — COMPREHENSIVE METABOLIC PANEL
ALT: 45 IU/L — ABNORMAL HIGH (ref 0–44)
AST: 34 IU/L (ref 0–40)
Albumin/Globulin Ratio: 1.7 (ref 1.2–2.2)
Albumin: 4.3 g/dL (ref 3.8–4.8)
Alkaline Phosphatase: 54 IU/L (ref 39–117)
BUN/Creatinine Ratio: 30 — ABNORMAL HIGH (ref 10–24)
BUN: 19 mg/dL (ref 8–27)
Bilirubin Total: 0.9 mg/dL (ref 0.0–1.2)
CO2: 23 mmol/L (ref 20–29)
Calcium: 9.8 mg/dL (ref 8.6–10.2)
Chloride: 101 mmol/L (ref 96–106)
Creatinine, Ser: 0.63 mg/dL — ABNORMAL LOW (ref 0.76–1.27)
GFR calc Af Amer: 120 mL/min/{1.73_m2} (ref >59)
GFR calc non Af Amer: 103 mL/min/{1.73_m2} (ref >59)
Globulin, Total: 2.5 g/dL (ref 1.5–4.5)
Glucose: 109 mg/dL — ABNORMAL HIGH (ref 65–99)
Potassium: 3.9 mmol/L (ref 3.5–5.2)
Sodium: 140 mmol/L (ref 134–144)
Total Protein: 6.8 g/dL (ref 6.0–8.5)

## 2019-10-21 LAB — LIPID PANEL
Chol/HDL Ratio: 3.3 ratio (ref 0.0–5.0)
Cholesterol, Total: 136 mg/dL (ref 100–199)
HDL: 41 mg/dL (ref >39)
LDL Chol Calc (NIH): 76 mg/dL (ref 0–99)
Triglycerides: 102 mg/dL (ref 0–149)
VLDL Cholesterol Cal: 19 mg/dL (ref 5–40)

## 2019-10-21 LAB — HEMOGLOBIN A1C
Est. average glucose Bld gHb Est-mCnc: 126 mg/dL
Hgb A1c MFr Bld: 6 % — ABNORMAL HIGH (ref 4.8–5.6)

## 2019-10-28 ENCOUNTER — Encounter: Payer: Medicare HMO | Admitting: Family Medicine

## 2019-11-29 DIAGNOSIS — R69 Illness, unspecified: Secondary | ICD-10-CM | POA: Diagnosis not present

## 2019-12-07 DIAGNOSIS — D225 Melanocytic nevi of trunk: Secondary | ICD-10-CM | POA: Diagnosis not present

## 2019-12-07 DIAGNOSIS — D224 Melanocytic nevi of scalp and neck: Secondary | ICD-10-CM | POA: Diagnosis not present

## 2019-12-07 DIAGNOSIS — D1801 Hemangioma of skin and subcutaneous tissue: Secondary | ICD-10-CM | POA: Diagnosis not present

## 2019-12-07 DIAGNOSIS — L821 Other seborrheic keratosis: Secondary | ICD-10-CM | POA: Diagnosis not present

## 2019-12-07 DIAGNOSIS — D2262 Melanocytic nevi of left upper limb, including shoulder: Secondary | ICD-10-CM | POA: Diagnosis not present

## 2019-12-07 DIAGNOSIS — L570 Actinic keratosis: Secondary | ICD-10-CM | POA: Diagnosis not present

## 2020-03-08 DIAGNOSIS — R972 Elevated prostate specific antigen [PSA]: Secondary | ICD-10-CM | POA: Diagnosis not present

## 2020-03-15 DIAGNOSIS — N390 Urinary tract infection, site not specified: Secondary | ICD-10-CM | POA: Diagnosis not present

## 2020-03-15 DIAGNOSIS — C61 Malignant neoplasm of prostate: Secondary | ICD-10-CM | POA: Diagnosis not present

## 2020-03-15 DIAGNOSIS — N393 Stress incontinence (female) (male): Secondary | ICD-10-CM | POA: Diagnosis not present

## 2020-03-28 DIAGNOSIS — N3946 Mixed incontinence: Secondary | ICD-10-CM | POA: Diagnosis not present

## 2020-03-28 DIAGNOSIS — M6281 Muscle weakness (generalized): Secondary | ICD-10-CM | POA: Diagnosis not present

## 2020-03-28 DIAGNOSIS — N393 Stress incontinence (female) (male): Secondary | ICD-10-CM | POA: Diagnosis not present

## 2020-04-06 DIAGNOSIS — R3915 Urgency of urination: Secondary | ICD-10-CM | POA: Diagnosis not present

## 2020-04-06 DIAGNOSIS — M6281 Muscle weakness (generalized): Secondary | ICD-10-CM | POA: Diagnosis not present

## 2020-04-06 DIAGNOSIS — N3946 Mixed incontinence: Secondary | ICD-10-CM | POA: Diagnosis not present

## 2020-04-14 ENCOUNTER — Encounter: Payer: Self-pay | Admitting: Family Medicine

## 2020-04-24 ENCOUNTER — Ambulatory Visit (INDEPENDENT_AMBULATORY_CARE_PROVIDER_SITE_OTHER): Payer: Medicare HMO | Admitting: Family Medicine

## 2020-04-24 ENCOUNTER — Other Ambulatory Visit: Payer: Self-pay

## 2020-04-24 ENCOUNTER — Encounter: Payer: Self-pay | Admitting: Family Medicine

## 2020-04-24 VITALS — BP 119/67 | HR 99 | Temp 98.1°F | Ht 71.5 in | Wt 215.0 lb

## 2020-04-24 DIAGNOSIS — E785 Hyperlipidemia, unspecified: Secondary | ICD-10-CM | POA: Diagnosis not present

## 2020-04-24 DIAGNOSIS — R7303 Prediabetes: Secondary | ICD-10-CM | POA: Diagnosis not present

## 2020-04-24 DIAGNOSIS — G600 Hereditary motor and sensory neuropathy: Secondary | ICD-10-CM

## 2020-04-24 DIAGNOSIS — I10 Essential (primary) hypertension: Secondary | ICD-10-CM | POA: Diagnosis not present

## 2020-04-24 MED ORDER — LOSARTAN POTASSIUM 50 MG PO TABS: 50.0000 mg | ORAL_TABLET | Freq: Every day | ORAL | 3 refills | Status: DC

## 2020-04-24 MED ORDER — SIMVASTATIN 20 MG PO TABS: 20.0000 mg | ORAL_TABLET | Freq: Every day | ORAL | 3 refills | Status: DC

## 2020-04-24 NOTE — Progress Notes (Signed)
Subjective:  Patient ID: Travis Ellis, male    DOB: 09/05/1955  Age: 64 y.o. MRN: [id number]  CC:  Chief Complaint  Patient presents with  . Follow-up    on hypertension, hyperlipidemia, and prediabetes. Pt reports he has done his best to change his diet for the better. pt dose his best to stay away from sweets. Pt reports no issues with hypertension since last OV. Pt rarerly checks his BP.Marland Kitchen Pt reports he sees lester borden at [facility] urology to make sure his prostate cancer dosn't come back. pt state when he saw them in july he was told nothing was found. pt states this is just an FYI for the provider.     HPI Travis Ellis presents for   Muscle loss/weakness, leg neuropathy: Distal hereditary motor neuropathy. Neuro - Dr. [name] at [facility], but weakness in both lower legs.  Would like to meet with Dr. [name].    Prediabetes: Has tried to adjust diet. Cutting back on some bread. Rare dessert.  Rare soda - 1 per week, 1/2 sweet tea at times.  rare alcohol. Most meals at restaurants. Not much fast food.  Exercise: trying to walk few 1000 steps per day. Some stationary bike.  Wt Readings from Last 3 Encounters:  04/24/20 215 lb (97.5 kg)  10/20/19 208 lb (94.3 kg)  04/26/19 207 lb 12.8 oz (94.3 kg)    Lab Results  Component Value Date   HGBA1C 6.0 (H) 10/20/2019   HGBA1C 5.8 (H) 04/26/2019   HGBA1C 6.2 (H) 09/18/2018   Lab Results  Component Value Date   LDLCALC 76 10/20/2019   CREATININE 0.63 (L) 10/20/2019    Hypertension: Losartan hct 50/12.5mg  qd. Would like to try lower dose of meds as BP controlled.Home readings: rare.   BP Readings from Last 3 Encounters:  04/24/20 119/67  10/20/19 119/77  04/26/19 119/76   Lab Results  Component Value Date   CREATININE 0.63 (L) 10/20/2019    Hyperlipidemia: Simvastatin 20mg  QD. No new myalgias. Some various arthralgias no new symptoms. Not fasting today.  Lab Results  Component Value Date   CHOL 136  10/20/2019   HDL 41 10/20/2019   LDLCALC 76 10/20/2019   TRIG 102 10/20/2019   CHOLHDL 3.3 10/20/2019   Lab Results  Component Value Date   ALT 45 (H) 10/20/2019   AST 34 10/20/2019   ALKPHOS 54 10/20/2019   BILITOT 0.9 10/20/2019    Prostate Cancer: followed by Dr. Alinda Money, at [facility].  On surveillance from radical prostatectomy, then salvage radiation therapy.  Last visit in July with urology.  History of stress incontinence status post prostatectomy and radiation. Has been working with physical therapist for stress incontinence.   History Patient Active Problem List   Diagnosis Date Noted  . Left rotator cuff tear 10/22/2018  . Risk for coronary artery disease between 10% and 20% in next 10 years 11/27/2016  . Prostate cancer ([place]) 09/14/2012  . Abnormal gait 05/21/2012  . HTN (hypertension) 02/03/2012  . Neuropathy, leg 02/03/2012   Past Medical History:  Diagnosis Date  . Allergy   . Arthritis   . Cancer [facility]) 2008   prostate  . GERD (gastroesophageal reflux disease)   . Hyperlipidemia   . Hypertension   . Neuromuscular disorder ([place])   . Prostate cancer ([place])    [id number] adenocarcinoma of the prostate   Past Surgical History:  Procedure Laterality Date  . CHOLECYSTECTOMY N/A 03/16/2015   Procedure: LAPAROSCOPIC  CHOLECYSTECTOMY WITH INTRAOPERATIVE CHOLANGIOGRAM;  Surgeon: [name], MD;  Location: [facility] ORS;  Service: General;  Laterality: N/A;  . JOINT REPLACEMENT Right 08/2009   knee  . REPLACEMENT TOTAL KNEE  2010   right knee  . ROTATOR CUFF REPAIR Left 06/21/2015   No Known Allergies Prior to Admission medications   Medication Sig Start Date End Date Taking? Authorizing Provider  aspirin EC 81 MG tablet Take 1 tablet (81 mg total) by mouth daily. 02/23/18  Yes Tereasa Coop, PA-C  losartan-hydrochlorothiazide (HYZAAR) 50-12.5 MG tablet Take 1 tablet by mouth daily. 10/20/19  Yes Wendie Agreste, MD  Multiple Vitamin (MULTIVITAMIN) tablet  Take 1 tablet by mouth daily.   Yes [provider]  simvastatin (ZOCOR) 20 MG tablet Take 1 tablet (20 mg total) by mouth at bedtime. 10/20/19  Yes Wendie Agreste, MD   Social History   Socioeconomic History  . Marital status: Married    Spouse name: Not on file  . Number of children: Not on file  . Years of education: Not on file  . Highest education level: Not on file  Occupational History  . Not on file  Tobacco Use  . Smoking status: Never Smoker  . Smokeless tobacco: Never Used  Substance and Sexual Activity  . Alcohol use: Yes    Comment: rarely  . Drug use: No  . Sexual activity: Yes  Other Topics Concern  . Not on file  Social History Narrative   Exercise walking daily for 1 mile   Social Determinants of Health   Financial Resource Strain:   . Difficulty of Paying Living Expenses: Not on file  Food Insecurity:   . Worried About Charity fundraiser in the Last Year: Not on file  . Ran Out of Food in the Last Year: Not on file  Transportation Needs:   . Lack of Transportation (Medical): Not on file  . Lack of Transportation (Non-Medical): Not on file  Physical Activity:   . Days of Exercise per Week: Not on file  . Minutes of Exercise per Session: Not on file  Stress:   . Feeling of Stress : Not on file  Social Connections:   . Frequency of Communication with Friends and Family: Not on file  . Frequency of Social Gatherings with Friends and Family: Not on file  . Attends Religious Services: Not on file  . Active Member of Clubs or Organizations: Not on file  . Attends Archivist Meetings: Not on file  . Marital Status: Not on file  Intimate Partner Violence:   . Fear of Current or Ex-Partner: Not on file  . Emotionally Abused: Not on file  . Physically Abused: Not on file  . Sexually Abused: Not on file    Review of Systems  Constitutional: Negative for fatigue and unexpected weight change.  Eyes: Negative for visual disturbance.    Respiratory: Negative for cough, chest tightness and shortness of breath.   Cardiovascular: Negative for chest pain, palpitations and leg swelling.  Gastrointestinal: Negative for abdominal pain and blood in stool.  Neurological: Negative for dizziness, light-headedness and headaches.     Objective:   Vitals:   04/24/20 1505  BP: 119/67  Pulse: 99  Temp: 98.1 F (36.7 C)  TempSrc: Temporal  SpO2: 95%  Weight: 215 lb (97.5 kg)  Height: 5' 11.5" (1.816 m)     Physical Exam Vitals reviewed.  Constitutional:      Appearance: He is well-developed.  HENT:     Head: Normocephalic and atraumatic.  Eyes:     Pupils: Pupils are equal, round, and reactive to light.  Neck:     Vascular: No carotid bruit or JVD.  Cardiovascular:     Rate and Rhythm: Normal rate and regular rhythm.     Heart sounds: Normal heart sounds. No murmur heard.   Pulmonary:     Effort: Pulmonary effort is normal.     Breath sounds: Normal breath sounds. No rales.  Musculoskeletal:        General: Deformity (atrophy of lower leg muscles. ) present.  Skin:    General: Skin is warm and dry.  Neurological:     Mental Status: He is alert and oriented to person, place, and time.  Psychiatric:        Mood and Affect: Mood normal.        Behavior: Behavior normal.        Assessment & Plan:  Travis Ellis is a 64 y.o. male . Prediabetes - Plan: Hemoglobin A1c  -Some weight gain noted.  Discussed prediabetes diet, handout given, caution with restaurant food.  Continue activity, low impact exercise as able with his distal motor neuropathy.  Check A1c.  Essential hypertension - Plan: Lipid panel, Comprehensive metabolic panel Hypertension, well controlled - Plan: losartan-hydrochlorothiazide (HYZAAR) 50-12.5 MG tablet  -Stable, and has been stable for some time.  With his history of incontinence, will try to come off of diuretic.  Start losartan 50 mg by itself.  Could increase to 100 mg if elevated  readings, update by MyChart next few weeks.  Dyslipidemia - Plan: simvastatin (ZOCOR) 20 MG tablet  -Option of trial off simvastatin for a week or so to see if that may be related to various arthralgias.  If there is improved, he will call and we can discuss intermittent dosing strategy.  If no change then restart it daily  Distal hereditary motor neuropathy   -Atrophy of calf muscles bilaterally.  Has been followed previously by neurology.  Does request second opinion with local neurologist, referral placed.  Meds ordered this encounter  Medications  . simvastatin (ZOCOR) 20 MG tablet    Sig: Take 1 tablet (20 mg total) by mouth at bedtime.    Dispense:  90 tablet    Refill:  3  . losartan (COZAAR) 50 MG tablet    Sig: Take 1 tablet (50 mg total) by mouth daily.    Dispense:  90 tablet    Refill:  3   Patient Instructions   We can try lower dose of blood pressure meds - just losartan for now.  May help with some of the urinary symptoms. Keep a record of your blood pressures outside of the office and bring them to the next office visit. If over 140/90 - return sooner. Send me an update by Mychart in next few weeks.   Can try off simvastatin for a week to see if that has any impact on aches and pains. If no change, return to daily dosing. If it does improve, let me know and we can discuss a plan on med options and dosing options.   See info on diet with prediabetes. Thanks for coming in today.   Prediabetes Eating Plan Prediabetes is a condition that causes blood sugar (glucose) levels to be higher than normal. This increases the risk for developing diabetes. In order to prevent diabetes from developing, your health care provider may recommend a diet and other  lifestyle changes to help you:  Control your blood glucose levels.  Improve your cholesterol levels.  Manage your blood pressure. Your health care provider may recommend working with a diet and nutrition specialist  (dietitian) to make a meal plan that is best for you. What are tips for following this plan? Lifestyle  Set weight loss goals with the help of your health care team. It is recommended that most people with prediabetes lose 7% of their current body weight.  Exercise for at least 30 minutes at least 5 days a week.  Attend a support group or seek ongoing support from a mental health counselor.  Take over-the-counter and prescription medicines only as told by your health care provider. Reading food labels  Read food labels to check the amount of fat, salt (sodium), and sugar in prepackaged foods. Avoid foods that have: ? Saturated fats. ? Trans fats. ? Added sugars.  Avoid foods that have more than 300 milligrams (mg) of sodium per serving. Limit your daily sodium intake to less than 2,300 mg each day. Shopping  Avoid buying pre-made and processed foods. Cooking  Cook with olive oil. Do not use butter, lard, or ghee.  Bake, broil, grill, or boil foods. Avoid frying. Meal planning   Work with your dietitian to develop an eating plan that is right for you. This may include: ? Tracking how many calories you take in. Use a food diary, notebook, or mobile application to track what you eat at each meal. ? Using the glycemic index (GI) to plan your meals. The index tells you how quickly a food will raise your blood glucose. Choose low-GI foods. These foods take a longer time to raise blood glucose.  Consider following a Mediterranean diet. This diet includes: ? Several servings each day of fresh fruits and vegetables. ? Eating fish at least twice a week. ? Several servings each day of whole grains, beans, nuts, and seeds. ? Using olive oil instead of other fats. ? Moderate alcohol consumption. ? Eating small amounts of red meat and whole-fat dairy.  If you have high blood pressure, you may need to limit your sodium intake or follow a diet such as the DASH eating plan. DASH is an eating  plan that aims to lower high blood pressure. What foods are recommended? The items listed below may not be a complete list. Talk with your dietitian about what dietary choices are best for you. Grains Whole grains, such as whole-wheat or whole-grain breads, crackers, cereals, and pasta. Unsweetened oatmeal. Bulgur. Barley. Quinoa. Brown rice. Corn or whole-wheat flour tortillas or taco shells. Vegetables Lettuce. Spinach. Peas. Beets. Cauliflower. Cabbage. Broccoli. Carrots. Tomatoes. Squash. Eggplant. Herbs. Peppers. Onions. Cucumbers. Brussels sprouts. Fruits Berries. Bananas. Apples. Oranges. Grapes. Papaya. Mango. Pomegranate. Kiwi. Grapefruit. Cherries. Meats and other protein foods Seafood. Poultry without skin. Lean cuts of pork and beef. Tofu. Eggs. Nuts. Beans. Dairy Low-fat or fat-free dairy products, such as yogurt, cottage cheese, and cheese. Beverages Water. Tea. Coffee. Sugar-free or diet soda. Seltzer water. Lowfat or no-fat milk. Milk alternatives, such as soy or almond milk. Fats and oils Olive oil. Canola oil. Sunflower oil. Grapeseed oil. Avocado. Walnuts. Sweets and desserts Sugar-free or low-fat pudding. Sugar-free or low-fat ice cream and other frozen treats. Seasoning and other foods Herbs. Sodium-free spices. Mustard. Relish. Low-fat, low-sugar ketchup. Low-fat, low-sugar barbecue sauce. Low-fat or fat-free mayonnaise. What foods are not recommended? The items listed below may not be a complete list. Talk with your dietitian about what  dietary choices are best for you. Grains Refined white flour and flour products, such as bread, pasta, snack foods, and cereals. Vegetables Canned vegetables. Frozen vegetables with butter or cream sauce. Fruits Fruits canned with syrup. Meats and other protein foods Fatty cuts of meat. Poultry with skin. Breaded or fried meat. Processed meats. Dairy Full-fat yogurt, cheese, or milk. Beverages Sweetened drinks, such as sweet  iced tea and soda. Fats and oils Butter. Lard. Ghee. Sweets and desserts Baked goods, such as cake, cupcakes, pastries, cookies, and cheesecake. Seasoning and other foods Spice mixes with added salt. Ketchup. Barbecue sauce. Mayonnaise. Summary  To prevent diabetes from developing, you may need to make diet and other lifestyle changes to help control blood sugar, improve cholesterol levels, and manage your blood pressure.  Set weight loss goals with the help of your health care team. It is recommended that most people with prediabetes lose 7 percent of their current body weight.  Consider following a Mediterranean diet that includes plenty of fresh fruits and vegetables, whole grains, beans, nuts, seeds, fish, lean meat, low-fat dairy, and healthy oils. This information is not intended to replace advice given to you by your health care provider. Make sure you discuss any questions you have with your health care provider. Document Revised: 12/11/2018 Document Reviewed: 10/23/2016 Elsevier Patient Education  2020 [place] Your Hypertension Hypertension is commonly called high blood pressure. This is when the force of your blood pressing against the walls of your arteries is too strong. Arteries are blood vessels that carry blood from your heart throughout your body. Hypertension forces the heart to work harder to pump blood, and may cause the arteries to become narrow or stiff. Having untreated or uncontrolled hypertension can cause heart attack, stroke, kidney disease, and other problems. What are blood pressure readings? A blood pressure reading consists of a higher number over a lower number. Ideally, your blood pressure should be below 120/80. The first ("top") number is called the systolic pressure. It is a measure of the pressure in your arteries as your heart beats. The second ("bottom") number is called the diastolic pressure. It is a measure of the pressure in your  arteries as the heart relaxes. What does my blood pressure reading mean? Blood pressure is classified into four stages. Based on your blood pressure reading, your health care provider may use the following stages to determine what type of treatment you need, if any. Systolic pressure and diastolic pressure are measured in a unit called mm Hg. Normal  Systolic pressure: below 510.  Diastolic pressure: below 80. Elevated  Systolic pressure: 258-527.  Diastolic pressure: below 80. Hypertension stage 1  Systolic pressure: 782-423.  Diastolic pressure: 53-61. Hypertension stage 2  Systolic pressure: 443 or above.  Diastolic pressure: 90 or above. What health risks are associated with hypertension? Managing your hypertension is an important responsibility. Uncontrolled hypertension can lead to:  A heart attack.  A stroke.  A weakened blood vessel (aneurysm).  Heart failure.  Kidney damage.  Eye damage.  Metabolic syndrome.  Memory and concentration problems. What changes can I make to manage my hypertension? Hypertension can be managed by making lifestyle changes and possibly by taking medicines. Your health care provider will help you make a plan to bring your blood pressure within a normal range. Eating and drinking   Eat a diet that is high in fiber and potassium, and low in salt (sodium), added sugar, and fat. An example eating plan is  called the DASH (Dietary Approaches to Stop Hypertension) diet. To eat this way: ? Eat plenty of fresh fruits and vegetables. Try to fill half of your plate at each meal with fruits and vegetables. ? Eat whole grains, such as whole wheat pasta, brown rice, or whole grain bread. Fill about one quarter of your plate with whole grains. ? Eat low-fat diary products. ? Avoid fatty cuts of meat, processed or cured meats, and poultry with skin. Fill about one quarter of your plate with lean proteins such as fish, chicken without skin, beans,  eggs, and tofu. ? Avoid premade and processed foods. These tend to be higher in sodium, added sugar, and fat.  Reduce your daily sodium intake. Most people with hypertension should eat less than 1,500 mg of sodium a day.  Limit alcohol intake to no more than 1 drink a day for nonpregnant women and 2 drinks a day for men. One drink equals 12 oz of beer, 5 oz of wine, or 1 oz of hard liquor. Lifestyle  Work with your health care provider to maintain a healthy body weight, or to lose weight. Ask what an ideal weight is for you.  Get at least 30 minutes of exercise that causes your heart to beat faster (aerobic exercise) most days of the week. Activities may include walking, swimming, or biking.  Include exercise to strengthen your muscles (resistance exercise), such as weight lifting, as part of your weekly exercise routine. Try to do these types of exercises for 30 minutes at least 3 days a week.  Do not use any products that contain nicotine or tobacco, such as cigarettes and e-cigarettes. If you need help quitting, ask your health care provider.  Control any long-term (chronic) conditions you have, such as high cholesterol or diabetes. Monitoring  Monitor your blood pressure at home as told by your health care provider. Your personal target blood pressure may vary depending on your medical conditions, your age, and other factors.  Have your blood pressure checked regularly, as often as told by your health care provider. Working with your health care provider  Review all the medicines you take with your health care provider because there may be side effects or interactions.  Talk with your health care provider about your diet, exercise habits, and other lifestyle factors that may be contributing to hypertension.  Visit your health care provider regularly. Your health care provider can help you create and adjust your plan for managing hypertension. Will I need medicine to control my blood  pressure? Your health care provider may prescribe medicine if lifestyle changes are not enough to get your blood pressure under control, and if:  Your systolic blood pressure is 130 or higher.  Your diastolic blood pressure is 80 or higher. Take medicines only as told by your health care provider. Follow the directions carefully. Blood pressure medicines must be taken as prescribed. The medicine does not work as well when you skip doses. Skipping doses also puts you at risk for problems. Contact a health care provider if:  You think you are having a reaction to medicines you have taken.  You have repeated (recurrent) headaches.  You feel dizzy.  You have swelling in your ankles.  You have trouble with your vision. Get help right away if:  You develop a severe headache or confusion.  You have unusual weakness or numbness, or you feel faint.  You have severe pain in your chest or abdomen.  You vomit repeatedly.  You  have trouble breathing. Summary  Hypertension is when the force of blood pumping through your arteries is too strong. If this condition is not controlled, it may put you at risk for serious complications.  Your personal target blood pressure may vary depending on your medical conditions, your age, and other factors. For most people, a normal blood pressure is less than 120/80.  Hypertension is managed by lifestyle changes, medicines, or both. Lifestyle changes include weight loss, eating a healthy, low-sodium diet, exercising more, and limiting alcohol. This information is not intended to replace advice given to you by your health care provider. Make sure you discuss any questions you have with your health care provider. Document Revised: 12/11/2018 Document Reviewed: 07/17/2016 Elsevier Patient Education  [employer].     If you have lab work done today you will be contacted with your lab results within the next 2 weeks.  If you have not heard from [place] then  please contact us. The fastest way to get your results is to register for My Chart.   IF you received an x-ray today, you will receive an invoice from [facility]. Please contact [facility] at [phone] with questions or concerns regarding your invoice.   IF you received labwork today, you will receive an invoice from [facility]. Please contact [facility] at [phone] with questions or concerns regarding your invoice.   Our billing staff will not be able to assist you with questions regarding bills from these companies.  You will be contacted with the lab results as soon as they are available. The fastest way to get your results is to activate your My Chart account. Instructions are located on the last page of this paperwork. If you have not heard from [place] regarding the results in 2 weeks, please contact this office.         Signed, Merri Ray, MD [facility]

## 2020-04-24 NOTE — Patient Instructions (Addendum)
We can try lower dose of blood pressure meds - just losartan for now.  May help with some of the urinary symptoms. Keep a record of your blood pressures outside of the office and bring them to the next office visit. If over 140/90 - return sooner. Send me an update by Mychart in next few weeks.   Can try off simvastatin for a week to see if that has any impact on aches and pains. If no change, return to daily dosing. If it does improve, let me know and we can discuss a plan on med options and dosing options.   See info on diet with prediabetes. Thanks for coming in today.   Prediabetes Eating Plan Prediabetes is a condition that causes blood sugar (glucose) levels to be higher than normal. This increases the risk for developing diabetes. In order to prevent diabetes from developing, your health care provider may recommend a diet and other lifestyle changes to help you:  Control your blood glucose levels.  Improve your cholesterol levels.  Manage your blood pressure. Your health care provider may recommend working with a diet and nutrition specialist (dietitian) to make a meal plan that is best for you. What are tips for following this plan? Lifestyle  Set weight loss goals with the help of your health care team. It is recommended that most people with prediabetes lose 7% of their current body weight.  Exercise for at least 30 minutes at least 5 days a week.  Attend a support group or seek ongoing support from a mental health counselor.  Take over-the-counter and prescription medicines only as told by your health care provider. Reading food labels  Read food labels to check the amount of fat, salt (sodium), and sugar in prepackaged foods. Avoid foods that have: ? Saturated fats. ? Trans fats. ? Added sugars.  Avoid foods that have more than 300 milligrams (mg) of sodium per serving. Limit your daily sodium intake to less than 2,300 mg each day. Shopping  Avoid buying pre-made and  processed foods. Cooking  Cook with olive oil. Do not use butter, lard, or ghee.  Bake, broil, grill, or boil foods. Avoid frying. Meal planning   Work with your dietitian to develop an eating plan that is right for you. This may include: ? Tracking how many calories you take in. Use a food diary, notebook, or mobile application to track what you eat at each meal. ? Using the glycemic index (GI) to plan your meals. The index tells you how quickly a food will raise your blood glucose. Choose low-GI foods. These foods take a longer time to raise blood glucose.  Consider following a Mediterranean diet. This diet includes: ? Several servings each day of fresh fruits and vegetables. ? Eating fish at least twice a week. ? Several servings each day of whole grains, beans, nuts, and seeds. ? Using olive oil instead of other fats. ? Moderate alcohol consumption. ? Eating small amounts of red meat and whole-fat dairy.  If you have high blood pressure, you may need to limit your sodium intake or follow a diet such as the DASH eating plan. DASH is an eating plan that aims to lower high blood pressure. What foods are recommended? The items listed below may not be a complete list. Talk with your dietitian about what dietary choices are best for you. Grains Whole grains, such as whole-wheat or whole-grain breads, crackers, cereals, and pasta. Unsweetened oatmeal. Bulgur. Barley. Quinoa. Brown rice. Corn or  whole-wheat flour tortillas or taco shells. Vegetables Lettuce. Spinach. Peas. Beets. Cauliflower. Cabbage. Broccoli. Carrots. Tomatoes. Squash. Eggplant. Herbs. Peppers. Onions. Cucumbers. Brussels sprouts. Fruits Berries. Bananas. Apples. Oranges. Grapes. Papaya. Mango. Pomegranate. Kiwi. Grapefruit. Cherries. Meats and other protein foods Seafood. Poultry without skin. Lean cuts of pork and beef. Tofu. Eggs. Nuts. Beans. Dairy Low-fat or fat-free dairy products, such as yogurt, cottage cheese,  and cheese. Beverages Water. Tea. Coffee. Sugar-free or diet soda. Seltzer water. Lowfat or no-fat milk. Milk alternatives, such as soy or almond milk. Fats and oils Olive oil. Canola oil. Sunflower oil. Grapeseed oil. Avocado. Walnuts. Sweets and desserts Sugar-free or low-fat pudding. Sugar-free or low-fat ice cream and other frozen treats. Seasoning and other foods Herbs. Sodium-free spices. Mustard. Relish. Low-fat, low-sugar ketchup. Low-fat, low-sugar barbecue sauce. Low-fat or fat-free mayonnaise. What foods are not recommended? The items listed below may not be a complete list. Talk with your dietitian about what dietary choices are best for you. Grains Refined white flour and flour products, such as bread, pasta, snack foods, and cereals. Vegetables Canned vegetables. Frozen vegetables with butter or cream sauce. Fruits Fruits canned with syrup. Meats and other protein foods Fatty cuts of meat. Poultry with skin. Breaded or fried meat. Processed meats. Dairy Full-fat yogurt, cheese, or milk. Beverages Sweetened drinks, such as sweet iced tea and soda. Fats and oils Butter. Lard. Ghee. Sweets and desserts Baked goods, such as cake, cupcakes, pastries, cookies, and cheesecake. Seasoning and other foods Spice mixes with added salt. Ketchup. Barbecue sauce. Mayonnaise. Summary  To prevent diabetes from developing, you may need to make diet and other lifestyle changes to help control blood sugar, improve cholesterol levels, and manage your blood pressure.  Set weight loss goals with the help of your health care team. It is recommended that most people with prediabetes lose 7 percent of their current body weight.  Consider following a Mediterranean diet that includes plenty of fresh fruits and vegetables, whole grains, beans, nuts, seeds, fish, lean meat, low-fat dairy, and healthy oils. This information is not intended to replace advice given to you by your health care  provider. Make sure you discuss any questions you have with your health care provider. Document Revised: 12/11/2018 Document Reviewed: 10/23/2016 Elsevier Patient Education  2020 [place] Your Hypertension Hypertension is commonly called high blood pressure. This is when the force of your blood pressing against the walls of your arteries is too strong. Arteries are blood vessels that carry blood from your heart throughout your body. Hypertension forces the heart to work harder to pump blood, and may cause the arteries to become narrow or stiff. Having untreated or uncontrolled hypertension can cause heart attack, stroke, kidney disease, and other problems. What are blood pressure readings? A blood pressure reading consists of a higher number over a lower number. Ideally, your blood pressure should be below 120/80. The first ("top") number is called the systolic pressure. It is a measure of the pressure in your arteries as your heart beats. The second ("bottom") number is called the diastolic pressure. It is a measure of the pressure in your arteries as the heart relaxes. What does my blood pressure reading mean? Blood pressure is classified into four stages. Based on your blood pressure reading, your health care provider may use the following stages to determine what type of treatment you need, if any. Systolic pressure and diastolic pressure are measured in a unit called mm Hg. Normal  Systolic pressure: below 010.  Diastolic pressure: below 80. Elevated  Systolic pressure: 400-867.  Diastolic pressure: below 80. Hypertension stage 1  Systolic pressure: 619-509.  Diastolic pressure: 32-67. Hypertension stage 2  Systolic pressure: 124 or above.  Diastolic pressure: 90 or above. What health risks are associated with hypertension? Managing your hypertension is an important responsibility. Uncontrolled hypertension can lead to:  A heart attack.  A stroke.  A  weakened blood vessel (aneurysm).  Heart failure.  Kidney damage.  Eye damage.  Metabolic syndrome.  Memory and concentration problems. What changes can I make to manage my hypertension? Hypertension can be managed by making lifestyle changes and possibly by taking medicines. Your health care provider will help you make a plan to bring your blood pressure within a normal range. Eating and drinking   Eat a diet that is high in fiber and potassium, and low in salt (sodium), added sugar, and fat. An example eating plan is called the DASH (Dietary Approaches to Stop Hypertension) diet. To eat this way: ? Eat plenty of fresh fruits and vegetables. Try to fill half of your plate at each meal with fruits and vegetables. ? Eat whole grains, such as whole wheat pasta, brown rice, or whole grain bread. Fill about one quarter of your plate with whole grains. ? Eat low-fat diary products. ? Avoid fatty cuts of meat, processed or cured meats, and poultry with skin. Fill about one quarter of your plate with lean proteins such as fish, chicken without skin, beans, eggs, and tofu. ? Avoid premade and processed foods. These tend to be higher in sodium, added sugar, and fat.  Reduce your daily sodium intake. Most people with hypertension should eat less than 1,500 mg of sodium a day.  Limit alcohol intake to no more than 1 drink a day for nonpregnant women and 2 drinks a day for men. One drink equals 12 oz of beer, 5 oz of wine, or 1 oz of hard liquor. Lifestyle  Work with your health care provider to maintain a healthy body weight, or to lose weight. Ask what an ideal weight is for you.  Get at least 30 minutes of exercise that causes your heart to beat faster (aerobic exercise) most days of the week. Activities may include walking, swimming, or biking.  Include exercise to strengthen your muscles (resistance exercise), such as weight lifting, as part of your weekly exercise routine. Try to do these  types of exercises for 30 minutes at least 3 days a week.  Do not use any products that contain nicotine or tobacco, such as cigarettes and e-cigarettes. If you need help quitting, ask your health care provider.  Control any long-term (chronic) conditions you have, such as high cholesterol or diabetes. Monitoring  Monitor your blood pressure at home as told by your health care provider. Your personal target blood pressure may vary depending on your medical conditions, your age, and other factors.  Have your blood pressure checked regularly, as often as told by your health care provider. Working with your health care provider  Review all the medicines you take with your health care provider because there may be side effects or interactions.  Talk with your health care provider about your diet, exercise habits, and other lifestyle factors that may be contributing to hypertension.  Visit your health care provider regularly. Your health care provider can help you create and adjust your plan for managing hypertension. Will I need medicine to control my blood pressure? Your health care provider may prescribe  medicine if lifestyle changes are not enough to get your blood pressure under control, and if:  Your systolic blood pressure is 130 or higher.  Your diastolic blood pressure is 80 or higher. Take medicines only as told by your health care provider. Follow the directions carefully. Blood pressure medicines must be taken as prescribed. The medicine does not work as well when you skip doses. Skipping doses also puts you at risk for problems. Contact a health care provider if:  You think you are having a reaction to medicines you have taken.  You have repeated (recurrent) headaches.  You feel dizzy.  You have swelling in your ankles.  You have trouble with your vision. Get help right away if:  You develop a severe headache or confusion.  You have unusual weakness or numbness, or you  feel faint.  You have severe pain in your chest or abdomen.  You vomit repeatedly.  You have trouble breathing. Summary  Hypertension is when the force of blood pumping through your arteries is too strong. If this condition is not controlled, it may put you at risk for serious complications.  Your personal target blood pressure may vary depending on your medical conditions, your age, and other factors. For most people, a normal blood pressure is less than 120/80.  Hypertension is managed by lifestyle changes, medicines, or both. Lifestyle changes include weight loss, eating a healthy, low-sodium diet, exercising more, and limiting alcohol. This information is not intended to replace advice given to you by your health care provider. Make sure you discuss any questions you have with your health care provider. Document Revised: 12/11/2018 Document Reviewed: 07/17/2016 Elsevier Patient Education  [employer].     If you have lab work done today you will be contacted with your lab results within the next 2 weeks.  If you have not heard from [place] then please contact us. The fastest way to get your results is to register for My Chart.   IF you received an x-ray today, you will receive an invoice from [facility]. Please contact [facility] at [phone] with questions or concerns regarding your invoice.   IF you received labwork today, you will receive an invoice from [place]. Please contact [facility] at [phone] with questions or concerns regarding your invoice.   Our billing staff will not be able to assist you with questions regarding bills from these companies.  You will be contacted with the lab results as soon as they are available. The fastest way to get your results is to activate your My Chart account. Instructions are located on the last page of this paperwork. If you have not heard from [place] regarding the results in 2 weeks, please contact this office.

## 2020-04-25 LAB — COMPREHENSIVE METABOLIC PANEL
ALT: 43 IU/L (ref 0–44)
AST: 27 IU/L (ref 0–40)
Albumin/Globulin Ratio: 1.7 (ref 1.2–2.2)
Albumin: 4 g/dL (ref 3.8–4.8)
Alkaline Phosphatase: 49 IU/L (ref 48–121)
BUN/Creatinine Ratio: 22 (ref 10–24)
BUN: 21 mg/dL (ref 8–27)
Bilirubin Total: 0.5 mg/dL (ref 0.0–1.2)
CO2: 27 mmol/L (ref 20–29)
Calcium: 9.3 mg/dL (ref 8.6–10.2)
Chloride: 102 mmol/L (ref 96–106)
Creatinine, Ser: 0.96 mg/dL (ref 0.76–1.27)
GFR calc Af Amer: 95 mL/min/{1.73_m2} (ref >59)
GFR calc non Af Amer: 83 mL/min/{1.73_m2} (ref >59)
Globulin, Total: 2.3 g/dL (ref 1.5–4.5)
Glucose: 125 mg/dL — ABNORMAL HIGH (ref 65–99)
Potassium: 3.5 mmol/L (ref 3.5–5.2)
Sodium: 141 mmol/L (ref 134–144)
Total Protein: 6.3 g/dL (ref 6.0–8.5)

## 2020-04-25 LAB — LIPID PANEL
Chol/HDL Ratio: 3.7 ratio (ref 0.0–5.0)
Cholesterol, Total: 127 mg/dL (ref 100–199)
HDL: 34 mg/dL — ABNORMAL LOW (ref >39)
LDL Chol Calc (NIH): 63 mg/dL (ref 0–99)
Triglycerides: 178 mg/dL — ABNORMAL HIGH (ref 0–149)
VLDL Cholesterol Cal: 30 mg/dL (ref 5–40)

## 2020-04-25 LAB — HEMOGLOBIN A1C
Est. average glucose Bld gHb Est-mCnc: 126 mg/dL
Hgb A1c MFr Bld: 6 % — ABNORMAL HIGH (ref 4.8–5.6)

## 2020-04-27 ENCOUNTER — Other Ambulatory Visit: Payer: Self-pay | Admitting: Family Medicine

## 2020-04-27 DIAGNOSIS — I10 Essential (primary) hypertension: Secondary | ICD-10-CM

## 2020-05-02 DIAGNOSIS — N3946 Mixed incontinence: Secondary | ICD-10-CM | POA: Diagnosis not present

## 2020-05-02 DIAGNOSIS — R32 Unspecified urinary incontinence: Secondary | ICD-10-CM | POA: Diagnosis not present

## 2020-05-02 DIAGNOSIS — M6281 Muscle weakness (generalized): Secondary | ICD-10-CM | POA: Diagnosis not present

## 2020-05-03 ENCOUNTER — Encounter: Payer: Self-pay | Admitting: Family Medicine

## 2020-05-04 NOTE — Telephone Encounter (Signed)
Pt updating you on losartan and simvastatin.

## 2020-05-16 DIAGNOSIS — N393 Stress incontinence (female) (male): Secondary | ICD-10-CM | POA: Diagnosis not present

## 2020-05-16 DIAGNOSIS — M6281 Muscle weakness (generalized): Secondary | ICD-10-CM | POA: Diagnosis not present

## 2020-05-16 DIAGNOSIS — M62838 Other muscle spasm: Secondary | ICD-10-CM | POA: Diagnosis not present

## 2020-05-24 DIAGNOSIS — M6281 Muscle weakness (generalized): Secondary | ICD-10-CM | POA: Diagnosis not present

## 2020-05-24 DIAGNOSIS — N393 Stress incontinence (female) (male): Secondary | ICD-10-CM | POA: Diagnosis not present

## 2020-05-24 DIAGNOSIS — M62838 Other muscle spasm: Secondary | ICD-10-CM | POA: Diagnosis not present

## 2020-05-30 DIAGNOSIS — M6281 Muscle weakness (generalized): Secondary | ICD-10-CM | POA: Diagnosis not present

## 2020-05-30 DIAGNOSIS — N393 Stress incontinence (female) (male): Secondary | ICD-10-CM | POA: Diagnosis not present

## 2020-05-30 DIAGNOSIS — M62838 Other muscle spasm: Secondary | ICD-10-CM | POA: Diagnosis not present

## 2020-06-01 DIAGNOSIS — R69 Illness, unspecified: Secondary | ICD-10-CM | POA: Diagnosis not present

## 2020-06-13 DIAGNOSIS — R69 Illness, unspecified: Secondary | ICD-10-CM | POA: Diagnosis not present

## 2020-06-14 ENCOUNTER — Encounter: Payer: Self-pay | Admitting: Family Medicine

## 2020-06-16 NOTE — Telephone Encounter (Signed)
FYI: From patient on simvastatin  Also asking recommendation on continued aspirin

## 2020-06-20 DIAGNOSIS — M6281 Muscle weakness (generalized): Secondary | ICD-10-CM | POA: Diagnosis not present

## 2020-06-20 DIAGNOSIS — M62838 Other muscle spasm: Secondary | ICD-10-CM | POA: Diagnosis not present

## 2020-06-20 DIAGNOSIS — N393 Stress incontinence (female) (male): Secondary | ICD-10-CM | POA: Diagnosis not present

## 2020-07-06 DIAGNOSIS — R69 Illness, unspecified: Secondary | ICD-10-CM | POA: Diagnosis not present

## 2020-07-12 ENCOUNTER — Ambulatory Visit (INDEPENDENT_AMBULATORY_CARE_PROVIDER_SITE_OTHER): Payer: Medicare HMO | Admitting: Neurology

## 2020-07-12 ENCOUNTER — Encounter: Payer: Self-pay | Admitting: Neurology

## 2020-07-12 VITALS — BP 144/83 | HR 92 | Ht 72.0 in | Wt 210.5 lb

## 2020-07-12 DIAGNOSIS — R269 Unspecified abnormalities of gait and mobility: Secondary | ICD-10-CM

## 2020-07-12 DIAGNOSIS — G600 Hereditary motor and sensory neuropathy: Secondary | ICD-10-CM

## 2020-07-12 NOTE — Progress Notes (Signed)
Reason for visit: Progressive leg weakness  Referring physician: Dr. Lin [name] is a 64 y.o. male  History of present illness:  [name] is a 64 year old right-handed white male with a history of gradual weakness of the lower extremities that began to become symptomatic around 2007. The patient initially was seen through this office by Dr. [name], he was referred to [facility] to see Dr. [name]. The patient was felt to have a distal hereditary motor neuropathy. The patient did have EMG and nerve conduction study evaluation done in 2017, he has not been seen by Dr. [name] since that time. The patient has had MRI of the lumbar spine that did not show an etiology for his leg weakness. The patient claims that he has never had any weakness of the upper extremities although this was documented by Dr. [name]. The patient has prominent bilateral foot drop. The patient will fall on occasion, he believes that the weakness has worsened more over the last 6 months or so. He believes that he has more problems with the right leg than the left. He has had an AFO brace in the past but has not been using AFO braces. At times he may use an ankle support brace. The patient has urinary incontinence but this was associated with prostate cancer, the patient had a radical prostatectomy and had radiation therapy. He is in physical therapy to strengthen the pelvic floor muscles. He has had bilateral rotator cuff issues, he had surgery on the left and was told he needed surgery on the right. He has had a right total knee replacement and has arthritis involving the left knee as well. He is followed through [employer]. The patient has noted over the last several years that he has some numbness in the feet. He has noted symptoms consistent with a sciatic compression syndrome with pain in the left leg when he sits, he feels better when he stands up. He denies any significant problems with neck pain, he does have  occasional low back pain. He denies any severe shortness of breath, he might have some slight issues with this. He denies any problems with speech or swallowing. He has good use of the hands. He denies any family history of problems with leg weakness. He returns to this office for further evaluation.  Past Medical History:  Diagnosis Date  . Allergy   . Arthritis   . Cancer [facility]) 2008   prostate  . GERD (gastroesophageal reflux disease)   . Hyperlipidemia   . Hypertension   . Neuromuscular disorder ([place])   . Prostate cancer ([place])    [id number] adenocarcinoma of the prostate    Past Surgical History:  Procedure Laterality Date  . CHOLECYSTECTOMY N/A 03/16/2015   Procedure: LAPAROSCOPIC CHOLECYSTECTOMY WITH INTRAOPERATIVE CHOLANGIOGRAM;  Surgeon: [name], MD;  Location: [facility] ORS;  Service: General;  Laterality: N/A;  . JOINT REPLACEMENT Right 08/2009   knee  . REPLACEMENT TOTAL KNEE  2010   right knee  . ROTATOR CUFF REPAIR Left 06/21/2015    Family History  Problem Relation Age of Onset  . Breast cancer Mother   . Cancer Mother        skin  . Heart disease Father        heart attack  . COPD Father        smoker  . Hypertension Father   . Heart disease Paternal Grandfather   . Hyperlipidemia Paternal Grandfather     Social history:  reports that he has never smoked. He has never used smokeless tobacco. He reports current alcohol use. He reports that he does not use drugs.  Medications:  Prior to Admission medications   Medication Sig Start Date End Date Taking? Authorizing Provider  aspirin EC 81 MG tablet Take 1 tablet (81 mg total) by mouth daily. 02/23/18  Yes Tereasa Coop, PA-C  cetirizine (ZYRTEC) 10 MG chewable tablet Chew 10 mg by mouth daily.   Yes [provider]  losartan (COZAAR) 50 MG tablet Take 1 tablet (50 mg total) by mouth daily. 04/24/20  Yes Wendie Agreste, MD  Multiple Vitamin (MULTIVITAMIN) tablet Take 1 tablet by mouth daily.   Yes  [provider]  simvastatin (ZOCOR) 20 MG tablet Take 1 tablet (20 mg total) by mouth at bedtime. 04/24/20  Yes Wendie Agreste, MD  losartan-hydrochlorothiazide (HYZAAR) 50-12.5 MG tablet Take 1 tablet by mouth daily. 10/20/19   Wendie Agreste, MD     No Known Allergies  ROS:  Out of a complete 14 system review of symptoms, the patient complains only of the following symptoms, and all other reviewed systems are negative.  Leg weakness and numbness Walking difficulty Urinary incontinence  Blood pressure (!) 144/83, pulse 92, height 6' (1.829 m), weight 210 lb 8 oz (95.5 kg).  Physical Exam  General: The patient is alert and cooperative at the time of the examination.  Eyes: Pupils are equal, round, and reactive to light. Discs are flat bilaterally.  Neck: The neck is supple, no carotid bruits are noted.  Respiratory: The respiratory examination is clear.  Cardiovascular: The cardiovascular examination reveals a regular rate and rhythm, no obvious murmurs or rubs are noted.  Skin: Extremities are without significant edema. The feet have high arches bilaterally.  Neurologic Exam  Mental status: The patient is alert and oriented x 3 at the time of the examination. The patient has apparent normal recent and remote memory, with an apparently normal attention span and concentration ability.  Cranial nerves: Facial symmetry is present. There is good sensation of the face to pinprick and soft touch bilaterally. The strength of the facial muscles and the muscles to head turning and shoulder shrug are normal bilaterally. Speech is well enunciated, no aphasia or dysarthria is noted. Extraocular movements are full. Visual fields are full. The tongue is midline, and the patient has symmetric elevation of the soft palate. No obvious hearing deficits are noted.  Motor: The motor testing reveals 5 over 5 strength of the upper extremities with exception that there is slight weakness  with external rotation of the left arm. With the lower extremities, there is good strength with hip flexion, and knee extension and with adduction and abduction of the thighs. The patient has prominent weakness below the knees, particular in the peroneal muscles, the patient has better inversion of the foot than eversion strength, but does have a plantar flexion weakness. Good symmetric motor tone is noted throughout.  Sensory: Sensory testing is intact to pinprick, soft touch, vibration sensation, and position sense on the upper extremities. The patient has a pinprick sensory deficit up to the knees bilaterally and has impairment of vibration sensation in the feet, better at the ankles. Mild impairment of position sense is seen in both feet. No evidence of extinction is noted.  Coordination: Cerebellar testing reveals good finger-nose-finger and heel-to-shin bilaterally.  Gait and station: Gait is associated with a wide-based gait, the patient walks with the hips externally rotated slightly  with a prominent bilateral steppage gait. The patient walks with the knee slightly flexed. Tandem gait was not attempted.  Reflexes: Deep tendon reflexes are symmetric and normal bilaterally, with exception that the ankle jerk reflexes are absent bilaterally. Toes are downgoing bilaterally.   Assessment/Plan:  1. Peripheral neuropathy, probable hereditary  The patient has high arches, he has distal involvement of the lower extremities with sensory and motor involvement, mainly affecting the peroneal innervated muscles. 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 The progression has been slow over time. The patient theoretically should benefit significant from the use of AFO braces. I will send him to physical therapy for evaluation for braces and for balance training and initiation of muscle toning exercises for the proximal muscles of the legs.  The patient will follow up here in 6 months.  Jill Alexanders MD 07/12/2020 8:32 AM  [facility] [address] [place], [place] [address]  Phone [phone] Fax [phone]

## 2020-07-13 DIAGNOSIS — M62838 Other muscle spasm: Secondary | ICD-10-CM | POA: Diagnosis not present

## 2020-07-13 DIAGNOSIS — M6281 Muscle weakness (generalized): Secondary | ICD-10-CM | POA: Diagnosis not present

## 2020-07-13 DIAGNOSIS — N393 Stress incontinence (female) (male): Secondary | ICD-10-CM | POA: Diagnosis not present

## 2020-08-07 ENCOUNTER — Telehealth: Payer: Self-pay | Admitting: Physical Therapy

## 2020-08-07 ENCOUNTER — Other Ambulatory Visit: Payer: Self-pay

## 2020-08-07 ENCOUNTER — Ambulatory Visit: Payer: Medicare HMO | Attending: Neurology | Admitting: Physical Therapy

## 2020-08-07 DIAGNOSIS — M6281 Muscle weakness (generalized): Secondary | ICD-10-CM | POA: Insufficient documentation

## 2020-08-07 DIAGNOSIS — M21372 Foot drop, left foot: Secondary | ICD-10-CM | POA: Insufficient documentation

## 2020-08-07 DIAGNOSIS — R2689 Other abnormalities of gait and mobility: Secondary | ICD-10-CM | POA: Diagnosis not present

## 2020-08-07 DIAGNOSIS — R2681 Unsteadiness on feet: Secondary | ICD-10-CM | POA: Insufficient documentation

## 2020-08-07 DIAGNOSIS — M21371 Foot drop, right foot: Secondary | ICD-10-CM

## 2020-08-07 DIAGNOSIS — R262 Difficulty in walking, not elsewhere classified: Secondary | ICD-10-CM | POA: Insufficient documentation

## 2020-08-07 NOTE — Telephone Encounter (Signed)
Dr. [name], Venia Carbon was evaluated by Physical Therapy on 08/07/20.  The patient would benefit from B AFOs in order to improve gait mechanics due to B foot drop and to decr pt's fall risk.   If you agree, please place an order in [facility] workque for B AFOs in EPIC or fax the order to [phone]. Thank you, Janann August, PT, DPT 08/07/20 3:51 PM    [place] [address] [place], [place]  [address] Phone:  [phone] Fax:  [phone]

## 2020-08-07 NOTE — Telephone Encounter (Signed)
I will write an order for AFO braces.

## 2020-08-07 NOTE — Therapy (Signed)
[facility] [address] [place], [place], [address] Phone: [phone]   Fax:  [phone]  Physical Therapy Evaluation  Patient Details  Name: Travis Ellis MRN: [id number] Date of Birth: Jan 10, 1956 Referring Provider (PT): Margette Fast, MD   Encounter Date: 08/07/2020   PT End of Session - 08/07/20 1556    Visit Number 1    Number of Visits 17    Date for PT Re-Evaluation 11/05/20   written for 60 day POC   Authorization Type [employer]na Medicare    PT Start Time 1446    PT Stop Time 1535    PT Time Calculation (min) 49 min    Equipment Utilized During Treatment Gait belt    Activity Tolerance Patient tolerated treatment well    Behavior During Therapy [facility] for tasks assessed/performed           Past Medical History:  Diagnosis Date  . Allergy   . Arthritis   . Cancer [facility]) 2008   prostate  . GERD (gastroesophageal reflux disease)   . Hyperlipidemia   . Hypertension   . Neuromuscular disorder ([place])   . Prostate cancer ([place])    pT2aN0Mx adenocarcinoma of the prostate    Past Surgical History:  Procedure Laterality Date  . CHOLECYSTECTOMY N/A 03/16/2015   Procedure: LAPAROSCOPIC CHOLECYSTECTOMY WITH INTRAOPERATIVE CHOLANGIOGRAM;  Surgeon: [name], MD;  Location: [facility];  Service: General;  Laterality: N/A;  . JOINT REPLACEMENT Right 08/2009   knee  . REPLACEMENT TOTAL KNEE  2010   right knee  . ROTATOR CUFF REPAIR Left 06/21/2015    There were no vitals filed for this visit.    Subjective Assessment - 08/07/20 1456    Subjective The patient has prominent bilateral foot drop (reports it started in about 2007). The patient will fall on occasion, he believes that the weakness has worsened more over the last 6 months or so. Believes that he has more problems with the right leg than the left. Has had an MRI in the past of the lumbar spine which did not show an etiology for the leg weakness. He has  had an AFO brace in the past but has not been using AFO braces (reports that they are stiff). Instead would a R foot up brace, not wearing it now or on a regular basis. Per Dr. 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 Reports that he seldom falls.  Walks everyday/frequently (averages approx. 2,000 steps per day). Going up and down stairs have gotten much more challenging.    Pertinent History HLD, HTN, prostate cancer, R total knee replacement, B rotator cuff issues    Limitations Walking    How long can you walk comfortably? approx. 15 minutes    Patient Stated Goals wants to stabilize where he is, find some improvement, doesn't want to get any worse.    Currently in Pain? No/denies              [facility] PT Assessment - 08/07/20 1502      Assessment   Medical Diagnosis gait abnormality/B foot drop    Referring Provider (PT) Margette Fast, MD    Onset Date/Surgical Date 07/12/20   date of referral   Hand Dominance Right    Prior Therapy previous PT here in 2017 and 12 years ago or so, previous PT this year for pelvic floor      Precautions   Precautions Fall  Balance Screen   Has the patient fallen in the past 6 months Yes    How many times? 1 or 2    cant remember for sure, likely tripping outside   Has the patient had a decrease in activity level because of a fear of falling?  No    Is the patient reluctant to leave their home because of a fear of falling?  No      Home Environment   Living Environment Private residence    Living Arrangements Spouse/significant other    Type of [place] Two level;Other (Comment);Able to live on main level with bedroom/bathroom   with a basement    Alternate Level Stairs-Number of Steps 12    Alternate Level Stairs-Rails Right    Home Equipment Other (comment);Grab bars - tub/shower;Grab bars - toilet   walking stick    Additional Comments helps out with his wife at home. has a  sitting up exercise bike upstairs       Prior Function   Level of Independence Independent    Vocation Retired    [employer] worked for [employer] for 45 years    Leisure read and watch TV      Sensation   Light Touch Impaired by gross assessment   able to detect, but reports decr sensation below B knees   Proprioception Appears Intact    Additional Comments reports decr vibration sense/pin prick R>LLE down below knee (from visit with neurologist)      Coordination   Gross Motor Movements are Fluid and Coordinated Yes      Posture/Postural Control   Posture/Postural Control Postural limitations    Posture Comments incr trunk extension in standing, with B knee flexion       ROM / Strength   AROM / PROM / Strength Strength;AROM      AROM   Overall AROM Comments not formally assessed - decr B ankle DF/PF ROM due to strength deficits      Strength   Strength Assessment Site Hip;Knee;Ankle    Right/Left Hip Right;Left    Right Hip Flexion 4+/5    Right Hip Extension 2+/5    Right Hip ABduction 4+/5    Left Hip Flexion 5/5    Left Hip Extension 2+/5    Left Hip ABduction 4-/5    Right/Left Knee Right;Left    Right Knee Flexion 5/5    Right Knee Extension 5/5    Left Knee Flexion 4+/5    Left Knee Extension 4+/5    Right/Left Ankle Right;Left    Right Ankle Dorsiflexion 1/5    Right Ankle Plantar Flexion 2-/5    Left Ankle Dorsiflexion 1/5    Left Ankle Plantar Flexion 2/5      Transfers   Five time sit to stand comments  21.32 seconds with BUE support from chair, wide BOS, incr knee flexion when standing       Ambulation/Gait   Ambulation/Gait Yes    Ambulation/Gait Assistance 4: Min guard;5: Supervision    Ambulation Distance (Feet) --   clinic distances   Assistive device None    Gait Pattern Step-through pattern;Decreased arm swing - right;Decreased arm swing - left;Decreased dorsiflexion - right;Decreased dorsiflexion - left;Right steppage;Left steppage;Wide  base of support;Poor foot clearance - left;Poor foot clearance - right;Decreased trunk rotation;Decreased stride length;Left flexed knee in stance;Right flexed knee in stance   B foot drop, trunk extension during gait due to hip extensor   Ambulation  Surface Level;Indoor    Gait velocity 17.53 seconds = 1.87 ft/sec      Standardized Balance Assessment   Standardized Balance Assessment Timed Up and Go Test      Timed Up and Go Test   Normal TUG (seconds) 16.75    TUG Comments no AD                      Objective measurements completed on examination: See above findings.               PT Education - 08/07/20 1553    Education Details clinical findings, POC, PT getting pt scheduled for AFO consult.    Person(s) Educated Patient    Methods Explanation    Comprehension Verbalized understanding            PT Short Term Goals - 08/07/20 1558      PT SHORT TERM GOAL #1   Title Pt will be independent with initial HEP in order to build upon functional gains made in therapy. ALL STGS DUE 09/04/20    Time 4    Period Weeks    Status New    Target Date 09/04/20      PT SHORT TERM GOAL #2   Title Pt will undergo further assessment of B AFOs in order to improve gait and to decr fall risk.    Time 4    Period Weeks    Status New      PT SHORT TERM GOAL #3   Title Will assess stairs with LTG to be written as appropriate to improve safety with stair negotiation in pt's home.    Time 4    Period Weeks    Status New      PT SHORT TERM GOAL #4   Title Pt will improve 5x sit <> stand to 18 seconds or less with BUE support in order to demo improved balance, decr fall risk.    Baseline 21.32 seconds with BUE support from chair, wide BOS, incr knee flexion when standing    Time 4    Period Weeks    Status New      PT SHORT TERM GOAL #5   Title Pt will perform TUG in 15 seconds or less in order to demo decr fall risk.    Baseline 16.75    Time 4    Period Weeks     Status New             PT Long Term Goals - 08/07/20 1601      PT LONG TERM GOAL #1   Title Pt will be independent with final HEP in order to build upon functional gains made in therapy.    Time 8    Period Weeks    Status New      PT LONG TERM GOAL #2   Title Stair goal to be written as appropriate to demonstrate improved safety for stair negotiation in pt's home.    Time 8    Period Weeks    Status New      PT LONG TERM GOAL #3   Title Pt will ambulate outdoors over unlevel surfaces at least 500' with B AFOs and supervision in order to demo improved community mobility.    Time 8    Period Weeks    Status New      PT LONG TERM GOAL #4   Title Pt will improve gait speed to at least 2.3  ft/sec in order to demo improved community mobility.    Baseline 1.87 ft/sec with no AFOs.    Time 8    Period Weeks    Status New      PT LONG TERM GOAL #5   Title Pt will perform TUG in 13.5 seconds or less in order to demo decr fall risk.    Baseline 16.75 no AD    Time 8    Period Weeks    Status New                  Plan - 08/07/20 1603    Clinical Impression Statement Patient is a 64 year old male referred to Neuro OPPT for gait abnormality/B foot drop (since approx.. 2007). Pt reports he has gotten an AFO in the past but did not wear it because it is too stiff. Occasionally wears a R foot up brace. Will request an orthotic consult due to B foot drop.  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 [name]).  The following deficits were present during the exam:  decr BLE strength (notably B DF, PF and hip extensors), gait abnormalities, impaired balance), decr ROM, decr strength for functional transfers, postural abnormalities. Based on TUG and 5x sit <> stand pt is at a high risk for falls. Pt's gait speed indicates that pt is a limited community ambulator. 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    Examination-Activity Limitations Locomotion Level;Stand;Transfers;Stairs    Examination-Participation Restrictions Yard Work;Community Activity    Stability/Clinical Decision Making Evolving/Moderate complexity    Clinical Decision Making Moderate    Rehab Potential Good    PT Frequency 2x / week    PT Duration 8 weeks    PT Treatment/Interventions ADLs/Self Care Home Management;Gait training;Stair training;DME Instruction;Functional mobility training;Therapeutic activities;Therapeutic exercise;Orthotic Fit/Training;Patient/family education;Neuromuscular re-education;Balance training;Passive range of motion;Vestibular    PT Next Visit Plan Gerald Stabs to be here on monday the 8th at 2:45, assess stairs and write goal as appropriate. initial HEP for balance/BLE strength    Consulted and Agree with Plan of Care Patient           Patient will benefit from skilled therapeutic intervention in order to improve the following deficits and impairments:  Abnormal gait, Decreased activity tolerance, Decreased balance, Decreased range of motion, Difficulty walking, Decreased strength, Impaired flexibility, Impaired sensation, Postural dysfunction  Visit Diagnosis: Unsteadiness on feet  Muscle weakness (generalized)  Other abnormalities of gait and mobility  Foot drop, left  Foot drop, right  Difficulty in walking, not elsewhere classified     Problem List Patient Active Problem List   Diagnosis Date Noted  . Left rotator cuff tear 10/22/2018  . Risk for coronary artery disease between 10% and 20% in next 10 years  11/27/2016  . Prostate cancer ([place]) 09/14/2012  . Abnormal gait 05/21/2012  . HTN (hypertension) 02/03/2012  . Peripheral neuropathy 02/03/2012    Arliss Journey, PT, DPT  08/07/2020, 4:16 PM  [facility] [address] [place], [place], [address] Phone: [phone]   Fax:  [phone]  Name: Travis Ellis MRN: [id number] Date of  Birth: Mar 21, 1956

## 2020-08-14 ENCOUNTER — Encounter: Payer: Self-pay | Admitting: Physical Therapy

## 2020-08-14 ENCOUNTER — Ambulatory Visit: Payer: Medicare HMO | Admitting: Physical Therapy

## 2020-08-14 ENCOUNTER — Other Ambulatory Visit: Payer: Self-pay

## 2020-08-14 DIAGNOSIS — M21371 Foot drop, right foot: Secondary | ICD-10-CM

## 2020-08-14 DIAGNOSIS — R2689 Other abnormalities of gait and mobility: Secondary | ICD-10-CM | POA: Diagnosis not present

## 2020-08-14 DIAGNOSIS — R2681 Unsteadiness on feet: Secondary | ICD-10-CM | POA: Diagnosis not present

## 2020-08-14 DIAGNOSIS — M6281 Muscle weakness (generalized): Secondary | ICD-10-CM | POA: Diagnosis not present

## 2020-08-14 DIAGNOSIS — M21372 Foot drop, left foot: Secondary | ICD-10-CM | POA: Diagnosis not present

## 2020-08-14 DIAGNOSIS — R262 Difficulty in walking, not elsewhere classified: Secondary | ICD-10-CM | POA: Diagnosis not present

## 2020-08-14 NOTE — Therapy (Signed)
[facility] [address] [place], [place], [address] Phone: [phone]   Fax:  [phone]  Physical Therapy Treatment  Patient Details  Name: Travis Ellis MRN: [id number] Date of Birth: 1955/11/07 Referring Provider (PT): Margette Fast, MD   Encounter Date: 08/14/2020   PT End of Session - 08/14/20 1627    Visit Number 2    Number of Visits 17    Date for PT Re-Evaluation 11/05/20   written for 60 day POC   Authorization Type Aetna Medicare    PT Start Time 1450    PT Stop Time 1531    PT Time Calculation (min) 41 min    Equipment Utilized During Treatment Gait belt    Activity Tolerance Patient tolerated treatment well    Behavior During Therapy [facility] for tasks assessed/performed           Past Medical History:  Diagnosis Date  . Allergy   . Arthritis   . Cancer [facility]) 2008   prostate  . GERD (gastroesophageal reflux disease)   . Hyperlipidemia   . Hypertension   . Neuromuscular disorder ([place])   . Prostate cancer ([place])    pT2aN0Mx adenocarcinoma of the prostate    Past Surgical History:  Procedure Laterality Date  . CHOLECYSTECTOMY N/A 03/16/2015   Procedure: LAPAROSCOPIC CHOLECYSTECTOMY WITH INTRAOPERATIVE CHOLANGIOGRAM;  Surgeon: [name], MD;  Location: [facility];  Service: General;  Laterality: N/A;  . JOINT REPLACEMENT Right 08/2009   knee  . REPLACEMENT TOTAL KNEE  2010   right knee  . ROTATOR CUFF REPAIR Left 06/21/2015    There were no vitals filed for this visit.                      [facility] Adult PT Treatment/Exercise - 08/14/20 1631      Ambulation/Gait   Ambulation/Gait Yes    Ambulation/Gait Assistance 5: Supervision    Ambulation/Gait Assistance Details Gerald Stabs, orthotist present during session for orthotic consult. Decided that what would be best for pt would be to help control pt's supination esp LLE since it is worse than RLE vs. Trying to control B foot  slap. Gerald Stabs deciding that a fully articulating custom AFO for ankle stability to decr supination for LLE. Pt wanting to start off with one for his LLE first and see how that feels before getting casted for his RLE at the same time. For the mean time will continue to use R foot up brace on RLE. Discussed with pt after pt gets casted will likely go on hold until he receives his AFO, pt in agreement.    Ambulation Distance (Feet) --   clinic distances for orthotic consult   Assistive device None    Gait Pattern Step-through pattern;Decreased arm swing - right;Decreased arm swing - left;Decreased dorsiflexion - right;Decreased dorsiflexion - left;Right steppage;Left steppage;Wide base of support;Poor foot clearance - left;Poor foot clearance - right;Decreased trunk rotation;Decreased stride length;Left flexed knee in stance;Right flexed knee in stance   B foot drop, trunk extension during gait due to hip extensor weakness, LLE>RLE supination with genu varum   Ambulation Surface Level;Indoor           Access Code: [email or web address] URL: [email or web address] Date: 08/14/2020 Prepared by: Janann August  Initiated pt's HEP. Will continue at next session and provide pt with handout.   Exercises Sit to Stand - 2 x daily - 5 x weekly - 2 sets - 5 reps Standing Marching - 2  x daily - 5 x weekly - 2 sets - 10 reps Side Stepping with Counter Support - 2 x daily - 5 x weekly - 3 sets     PT Education - 08/14/20 1627    Education Details orthotic consult, initial HEP (began to review previous from pelvic health and adding additional balance/hip strengthening)    Person(s) Educated Patient    Methods Explanation;Demonstration    Comprehension Verbalized understanding;Returned demonstration            PT Short Term Goals - 08/07/20 1558      PT SHORT TERM GOAL #1   Title Pt will be independent with initial HEP in order to build upon functional gains made in therapy. ALL STGS DUE 09/04/20     Time 4    Period Weeks    Status New    Target Date 09/04/20      PT SHORT TERM GOAL #2   Title Pt will undergo further assessment of B AFOs in order to improve gait and to decr fall risk.    Time 4    Period Weeks    Status New      PT SHORT TERM GOAL #3   Title Will assess stairs with LTG to be written as appropriate to improve safety with stair negotiation in pt's home.    Time 4    Period Weeks    Status New      PT SHORT TERM GOAL #4   Title Pt will improve 5x sit <> stand to 18 seconds or less with BUE support in order to demo improved balance, decr fall risk.    Baseline 21.32 seconds with BUE support from chair, wide BOS, incr knee flexion when standing    Time 4    Period Weeks    Status New      PT SHORT TERM GOAL #5   Title Pt will perform TUG in 15 seconds or less in order to demo decr fall risk.    Baseline 16.75    Time 4    Period Weeks    Status New             PT Long Term Goals - 08/07/20 1601      PT LONG TERM GOAL #1   Title Pt will be independent with final HEP in order to build upon functional gains made in therapy.    Time 8    Period Weeks    Status New      PT LONG TERM GOAL #2   Title Stair goal to be written as appropriate to demonstrate improved safety for stair negotiation in pt's home.    Time 8    Period Weeks    Status New      PT LONG TERM GOAL #3   Title Pt will ambulate outdoors over unlevel surfaces at least 500' with B AFOs and supervision in order to demo improved community mobility.    Time 8    Period Weeks    Status New      PT LONG TERM GOAL #4   Title Pt will improve gait speed to at least 2.3 ft/sec in order to demo improved community mobility.    Baseline 1.87 ft/sec with no AFOs.    Time 8    Period Weeks    Status New      PT LONG TERM GOAL #5   Title Pt will perform TUG in 13.5 seconds or less in order to  demo decr fall risk.    Baseline 16.75 no AD    Time 8    Period Weeks    Status New                  Plan - 08/14/20 1628    Clinical Impression Statement Orthotist Gerald Stabs present today for orthotic consult. Orthotist's recommendation is a fully articulating custom AFO for ankle stability to decr supination with LLE. Decided to do L AFO first and will decide about going forward with R AFO (vs. just using R foot up brace). See gait section for more details. Remainder of session focused on initiating HEP for strengthening/balance. Will continue to progress towards LTGs.    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    Examination-Activity Limitations Locomotion Level;Stand;Transfers;Stairs    Examination-Participation Restrictions Yard Work;Community Activity    Stability/Clinical Decision Making Evolving/Moderate complexity    Rehab Potential Good    PT Frequency 2x / week    PT Duration 8 weeks    PT Treatment/Interventions ADLs/Self Care Home Management;Gait training;Stair training;DME Instruction;Functional mobility training;Therapeutic activities;Therapeutic exercise;Orthotic Fit/Training;Patient/family education;Neuromuscular re-education;Balance training;Passive range of motion;Vestibular    PT Next Visit Plan initial HEP for balance/BLE strength (exp hip extensor and hip ABD). pt has previous HEP from pelvic health so some of those hip strengthening would be good to continue as well (review as pt will bring them in his folder). assess stairs and write goal as appropriate.    PT Home Exercise Plan [email or web address]    Consulted and Agree with Plan of Care Patient           Patient will benefit from skilled therapeutic intervention in order to improve the following deficits and impairments:  Abnormal gait,Decreased activity tolerance,Decreased balance,Decreased range of  motion,Difficulty walking,Decreased strength,Impaired flexibility,Impaired sensation,Postural dysfunction  Visit Diagnosis: Unsteadiness on feet  Muscle weakness (generalized)  Other abnormalities of gait and mobility  Foot drop, left  Foot drop, right  Difficulty in walking, not elsewhere classified     Problem List Patient Active Problem List   Diagnosis Date Noted  . Left rotator cuff tear 10/22/2018  . Risk for coronary artery disease between 10% and 20% in next 10 years 11/27/2016  . Prostate cancer ([place]) 09/14/2012  . Abnormal gait 05/21/2012  . HTN (hypertension) 02/03/2012  . Peripheral neuropathy 02/03/2012    Arliss Journey, PT, DPT  08/14/2020, 4:32 PM  [facility] [address] [place], [place], [address] Phone: [phone]   Fax:  [phone]  Name: Travis Ellis MRN: [id number] Date of Birth: 1956/01/01

## 2020-08-14 NOTE — Patient Instructions (Signed)
Access Code: [email or web address] URL: [email or web address] Date: 08/14/2020 Prepared by: Janann August  Exercises Sit to Stand - 2 x daily - 5 x weekly - 2 sets - 5 reps Standing Marching - 2 x daily - 5 x weekly - 2 sets - 10 reps Side Stepping with Counter Support - 2 x daily - 5 x weekly - 3 sets

## 2020-08-16 ENCOUNTER — Encounter: Payer: Self-pay | Admitting: Physical Therapy

## 2020-08-16 ENCOUNTER — Ambulatory Visit: Payer: Medicare HMO | Admitting: Physical Therapy

## 2020-08-16 ENCOUNTER — Other Ambulatory Visit: Payer: Self-pay

## 2020-08-16 DIAGNOSIS — M21372 Foot drop, left foot: Secondary | ICD-10-CM | POA: Diagnosis not present

## 2020-08-16 DIAGNOSIS — R2689 Other abnormalities of gait and mobility: Secondary | ICD-10-CM

## 2020-08-16 DIAGNOSIS — M6281 Muscle weakness (generalized): Secondary | ICD-10-CM

## 2020-08-16 DIAGNOSIS — M21371 Foot drop, right foot: Secondary | ICD-10-CM | POA: Diagnosis not present

## 2020-08-16 DIAGNOSIS — R2681 Unsteadiness on feet: Secondary | ICD-10-CM

## 2020-08-16 DIAGNOSIS — R262 Difficulty in walking, not elsewhere classified: Secondary | ICD-10-CM | POA: Diagnosis not present

## 2020-08-16 NOTE — Patient Instructions (Signed)
Access Code: [email or web address] URL: [email or web address] Date: 08/16/2020 Prepared by: Janann August  Exercises Sit to Stand - 1-2 x daily - 5 x weekly - 2 sets - 5 reps Standing Marching - 1-2 x daily - 5 x weekly - 2 sets - 10 reps Side Stepping with Counter Support - 1-2 x daily - 5 x weekly - 3 sets Clamshell with Resistance - 1-2 x daily - 5 x weekly - 2 sets - 10 reps Supine Bridge - 1-2 x daily - 5 x weekly - 2-3 sets - 5 reps Seated Hip Adduction Isometrics with Ball - 1-2 x daily - 5 x weekly - 2 sets - 10 reps Romberg Stance Eyes Closed on Foam Pad - 1 x daily - 5 x weekly - 3 sets - 30 hold

## 2020-08-16 NOTE — Therapy (Signed)
[facility] [address] [place], [place], [address] Phone: [phone]   Fax:  [phone]  Physical Therapy Treatment  Patient Details  Name: Travis Ellis MRN: [id number] Date of Birth: 06-14-1956 Referring Provider (PT): Margette Fast, MD   Encounter Date: 08/16/2020   PT End of Session - 08/16/20 1536    Visit Number 3    Number of Visits 17    Date for PT Re-Evaluation 11/05/20   written for 60 day POC   Authorization Type [employer] Medicare    PT Start Time 1445    PT Stop Time 1530    PT Time Calculation (min) 45 min    Equipment Utilized During Treatment Gait belt    Activity Tolerance Patient tolerated treatment well    Behavior During Therapy [facility] for tasks assessed/performed           Past Medical History:  Diagnosis Date  . Allergy   . Arthritis   . Cancer [facility]) 2008   prostate  . GERD (gastroesophageal reflux disease)   . Hyperlipidemia   . Hypertension   . Neuromuscular disorder ([place])   . Prostate cancer ([place])    pT2aN0Mx adenocarcinoma of the prostate    Past Surgical History:  Procedure Laterality Date  . CHOLECYSTECTOMY N/A 03/16/2015   Procedure: LAPAROSCOPIC CHOLECYSTECTOMY WITH INTRAOPERATIVE CHOLANGIOGRAM;  Surgeon: [name], MD;  Location: [facility];  Service: General;  Laterality: N/A;  . JOINT REPLACEMENT Right 08/2009   knee  . REPLACEMENT TOTAL KNEE  2010   right knee  . ROTATOR CUFF REPAIR Left 06/21/2015    There were no vitals filed for this visit.   Subjective Assessment - 08/16/20 1450    Subjective Being casted for his L AFO tomorrow at [facility].    Pertinent History HLD, HTN, prostate cancer, R total knee replacement, B rotator cuff issues    Limitations Walking    How long can you walk comfortably? approx. 15 minutes    Patient Stated Goals wants to stabilize where he is, find some improvement, doesn't want to get any worse.    Currently in Pain? No/denies                              [facility] Adult PT Treatment/Exercise - 08/16/20 0001      Ambulation/Gait   Ambulation/Gait Yes    Ambulation/Gait Assistance 5: Supervision    Ambulation/Gait Assistance Details throughout session    Assistive device None    Gait Pattern Step-through pattern;Decreased arm swing - right;Decreased arm swing - left;Decreased dorsiflexion - right;Decreased dorsiflexion - left;Right steppage;Left steppage;Wide base of support;Poor foot clearance - left;Poor foot clearance - right;Decreased trunk rotation;Decreased stride length;Left flexed knee in stance;Right flexed knee in stance   B foot drop, trunk extension during gait due to hip extensor   Ambulation Surface Level;Indoor    Stairs Yes    Stairs Assistance 5: Supervision    Stairs Assistance Details (indicate cue type and reason) pt reporting incr difficulty descending stairs at times at home when going down into the basement, at times needs to go down sideways    Stair Management Technique One rail Right;Alternating pattern;Step to pattern;Forwards    Number of Stairs 4    Height of Stairs 6              Access Code: [email or web address] URL: [email or web address] Date: 08/16/2020 Prepared by: Janann August  Initial HEP. See MedBridge  for more details   Exercises Sit to Stand - 1-2 x daily - 5 x weekly - 2 sets - 5 reps Standing Marching - 1-2 x daily - 5 x weekly - 2 sets - 10 reps Side Stepping with Counter Support - 1-2 x daily - 5 x weekly - 3 sets Clamshell with Resistance - 1-2 x daily - 5 x weekly - 2 sets - 10 reps - with yellow tband, 2 x 10 reps B, initial verbal and manual cues for proper technique to prevent hips from rolling backwards  Supine Bridge - 1-2 x daily - 5 x weekly - 2-3 sets - 5 reps - cues for proper breathing  Seated Hip Adduction Isometrics with Ball - 1-2 x daily - 5 x weekly - 2 sets - 10 reps Romberg Stance Eyes Closed on Foam Pad - 1 x daily - 5 x  weekly - 3 sets - 30 hold - with feet slightly apart on single pillow    NMR:  -on single pillow wider BOS eyes closed head turns 2 x 10 reps        PT Education - 08/16/20 1535    Education Details initial HEP for strengthening/balance, going on hold until pt receives his L AFO (is going to be casted tomorrow at [facility])    Person(s) Educated Patient    Methods Explanation;Demonstration;Handout    Comprehension Verbalized understanding;Returned demonstration            PT Short Term Goals - 08/07/20 1558      PT SHORT TERM GOAL #1   Title Pt will be independent with initial HEP in order to build upon functional gains made in therapy. ALL STGS DUE 09/04/20    Time 4    Period Weeks    Status New    Target Date 09/04/20      PT SHORT TERM GOAL #2   Title Pt will undergo further assessment of B AFOs in order to improve gait and to decr fall risk.    Time 4    Period Weeks    Status New      PT SHORT TERM GOAL #3   Title Will assess stairs with LTG to be written as appropriate to improve safety with stair negotiation in pt's home.    Time 4    Period Weeks    Status New      PT SHORT TERM GOAL #4   Title Pt will improve 5x sit <> stand to 18 seconds or less with BUE support in order to demo improved balance, decr fall risk.    Baseline 21.32 seconds with BUE support from chair, wide BOS, incr knee flexion when standing    Time 4    Period Weeks    Status New      PT SHORT TERM GOAL #5   Title Pt will perform TUG in 15 seconds or less in order to demo decr fall risk.    Baseline 16.75    Time 4    Period Weeks    Status New             PT Long Term Goals - 08/07/20 1601      PT LONG TERM GOAL #1   Title Pt will be independent with final HEP in order to build upon functional gains made in therapy.    Time 8    Period Weeks    Status New      PT LONG TERM GOAL #  2   Title Stair goal to be written as appropriate to demonstrate improved safety for stair  negotiation in pt's home.    Time 8    Period Weeks    Status New      PT LONG TERM GOAL #3   Title Pt will ambulate outdoors over unlevel surfaces at least 500' with B AFOs and supervision in order to demo improved community mobility.    Time 8    Period Weeks    Status New      PT LONG TERM GOAL #4   Title Pt will improve gait speed to at least 2.3 ft/sec in order to demo improved community mobility.    Baseline 1.87 ft/sec with no AFOs.    Time 8    Period Weeks    Status New      PT LONG TERM GOAL #5   Title Pt will perform TUG in 13.5 seconds or less in order to demo decr fall risk.    Baseline 16.75 no AD    Time 8    Period Weeks    Status New                 Plan - 08/16/20 1541    Clinical Impression Statement Today's skilled session focused on finishing initiating pt's HEP for strength and balance. No issues throughout session. Pt needing initial cues for proper technique on clamshells and bridging. Discussed with pt going on hold until he receives his L AFO (is being casted tomorrow). Pt in agreement. Will continue to progress towards LTGs.    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    Examination-Activity Limitations Locomotion Level;Stand;Transfers;Stairs    Examination-Participation Restrictions Yard Work;Community Activity    Stability/Clinical Decision Making Evolving/Moderate complexity    Rehab Potential Good    PT Frequency 2x / week    PT Duration 8 weeks    PT Treatment/Interventions ADLs/Self Care Home Management;Gait training;Stair training;DME Instruction;Functional mobility training;Therapeutic activities;Therapeutic exercise;Orthotic Fit/Training;Patient/family education;Neuromuscular re-education;Balance training;Passive range of  motion;Vestibular    PT Next Visit Plan how is HEP? pt to return after having L AFO, might need re-cert. then continue with balance/strengthening and stair training    PT Home Exercise Plan [email or web address]    Consulted and Agree with Plan of Care Patient           Patient will benefit from skilled therapeutic intervention in order to improve the following deficits and impairments:  Abnormal gait,Decreased activity tolerance,Decreased balance,Decreased range of motion,Difficulty walking,Decreased strength,Impaired flexibility,Impaired sensation,Postural dysfunction  Visit Diagnosis: Unsteadiness on feet  Muscle weakness (generalized)  Other abnormalities of gait and mobility     Problem List Patient Active Problem List   Diagnosis Date Noted  . Left rotator cuff tear 10/22/2018  . Risk for coronary artery disease between 10% and 20% in next 10 years 11/27/2016  . Prostate cancer ([place]) 09/14/2012  . Abnormal gait 05/21/2012  . HTN (hypertension) 02/03/2012  . Peripheral neuropathy 02/03/2012    Arliss Journey, PT ,DPT  08/16/2020, 3:45 PM  [facility] [address] [place], [place], [address] Phone: [phone]   Fax:  [phone]  Name: DAIRON PROCTER MRN: [id number] Date of Birth: 1956-01-13

## 2020-08-21 ENCOUNTER — Ambulatory Visit: Payer: Medicare HMO | Admitting: Physical Therapy

## 2020-08-22 ENCOUNTER — Telehealth: Payer: Self-pay | Admitting: Neurology

## 2020-08-22 NOTE — Telephone Encounter (Signed)
Shamika from [facility] called needing to speak to the RN regarding a fax she received on the pt. Please advise.

## 2020-08-22 NOTE — Telephone Encounter (Signed)
I spoke to [facility] and she advised that Dr. [name] needs to sign one more spot on the Rx for the AFO. I have placed paperwork back in his office and will fax this back as soon as it is signed. [facility] voiced appreciation.

## 2020-08-23 ENCOUNTER — Ambulatory Visit: Payer: Medicare HMO | Admitting: Physical Therapy

## 2020-08-23 DIAGNOSIS — N3946 Mixed incontinence: Secondary | ICD-10-CM | POA: Diagnosis not present

## 2020-08-23 DIAGNOSIS — R3915 Urgency of urination: Secondary | ICD-10-CM | POA: Diagnosis not present

## 2020-08-30 ENCOUNTER — Ambulatory Visit: Payer: Medicare HMO | Admitting: Physical Therapy

## 2020-09-04 ENCOUNTER — Ambulatory Visit: Payer: Medicare HMO | Admitting: Physical Therapy

## 2020-09-06 ENCOUNTER — Ambulatory Visit: Payer: Medicare HMO | Admitting: Physical Therapy

## 2020-09-08 DIAGNOSIS — M21372 Foot drop, left foot: Secondary | ICD-10-CM | POA: Diagnosis not present

## 2020-09-11 ENCOUNTER — Other Ambulatory Visit: Payer: Self-pay

## 2020-09-11 ENCOUNTER — Encounter: Payer: Self-pay | Admitting: Physical Therapy

## 2020-09-11 ENCOUNTER — Ambulatory Visit: Payer: Medicare HMO | Attending: Neurology | Admitting: Physical Therapy

## 2020-09-11 DIAGNOSIS — R262 Difficulty in walking, not elsewhere classified: Secondary | ICD-10-CM | POA: Insufficient documentation

## 2020-09-11 DIAGNOSIS — R2681 Unsteadiness on feet: Secondary | ICD-10-CM | POA: Insufficient documentation

## 2020-09-11 DIAGNOSIS — M21371 Foot drop, right foot: Secondary | ICD-10-CM | POA: Diagnosis not present

## 2020-09-11 DIAGNOSIS — M21372 Foot drop, left foot: Secondary | ICD-10-CM | POA: Diagnosis not present

## 2020-09-11 DIAGNOSIS — M6281 Muscle weakness (generalized): Secondary | ICD-10-CM | POA: Diagnosis not present

## 2020-09-11 DIAGNOSIS — R2689 Other abnormalities of gait and mobility: Secondary | ICD-10-CM | POA: Diagnosis not present

## 2020-09-11 NOTE — Therapy (Signed)
[facility] [address] [place], [place], [address] Phone: [phone]   Fax:  [phone]  Physical Therapy Treatment  Patient Details  Name: JAMARE VANATTA MRN: [id number] Date of Birth: 10-12-1955 Referring Provider (PT): Margette Fast, MD   Encounter Date: 09/11/2020   PT End of Session - 09/11/20 1537    Visit Number 4    Number of Visits 17    Date for PT Re-Evaluation 11/05/20   written for 60 day POC   Authorization Type [employer]na Medicare    PT Start Time 1448    PT Stop Time 1530    PT Time Calculation (min) 42 min    Equipment Utilized During Treatment Gait belt    Activity Tolerance Patient tolerated treatment well    Behavior During Therapy [facility] for tasks assessed/performed           Past Medical History:  Diagnosis Date  . Allergy   . Arthritis   . Cancer [facility]) 2008   prostate  . GERD (gastroesophageal reflux disease)   . Hyperlipidemia   . Hypertension   . Neuromuscular disorder ([place])   . Prostate cancer ([place])    pT2aN0Mx adenocarcinoma of the prostate    Past Surgical History:  Procedure Laterality Date  . CHOLECYSTECTOMY N/A 03/16/2015   Procedure: LAPAROSCOPIC CHOLECYSTECTOMY WITH INTRAOPERATIVE CHOLANGIOGRAM;  Surgeon: [name], MD;  Location: [facility];  Service: General;  Laterality: N/A;  . JOINT REPLACEMENT Right 08/2009   knee  . REPLACEMENT TOTAL KNEE  2010   right knee  . ROTATOR CUFF REPAIR Left 06/21/2015    There were no vitals filed for this visit.   Subjective Assessment - 09/11/20 1453    Subjective Got his L AFO last friday. Has been building up the wear time. States that there is some rubbing on the mdidle side of the ankle. Gerald Stabs Community education officer) is aware and said to call him back in a couple days to see how it is doing. Has been doing some of the exercises at home. Is a bit tired from all of the walking he has been doing.    Pertinent History HLD, HTN, prostate  cancer, R total knee replacement, B rotator cuff issues    Limitations Walking    How long can you walk comfortably? approx. 15 minutes    Patient Stated Goals wants to stabilize where he is, find some improvement, doesn't want to get any worse.    Currently in Pain? No/denies              [facility] PT Assessment - 09/11/20 1458      Standardized Balance Assessment   Standardized Balance Assessment Timed Up and Go Test;Dynamic Gait Index      Dynamic Gait Index   Level Surface Mild Impairment    Change in Gait Speed Mild Impairment    Gait with Horizontal Head Turns Normal    Gait with Vertical Head Turns Normal    Gait and Pivot Turn Mild Impairment    Step Over Obstacle Mild Impairment    Step Around Obstacles Normal    Steps Mild Impairment    Total Score 19    DGI comment: 19/24      Timed Up and Go Test   Normal TUG (seconds) 13.82    TUG Comments no AD                        [facility] Adult PT Treatment/Exercise -  09/11/20 1458      Transfers   Five time sit to stand comments  14.79 without UE support from chair min guard at times for balance due to pt losing balance posteriorly             Therapeutic Exercise:     Access Code: [email or web address] URL: [email or web address] Date: 08/16/2020 Prepared by: Janann August  Reviewed pt's HEP for balance and hip strengthening:   Exercises Sit to Stand - 1-2 x daily - 5 x weekly - 2 sets - 5 reps Standing Marching - 1-2 x daily - 5 x weekly - 2 sets - 10 reps Side Stepping with Counter Support - 1-2 x daily - 5 x weekly - 3 sets  Clamshell with Resistance - 1-2 x daily - 5 x weekly - 2 sets - 10 reps - with red tband, 2 x 10 reps B, cues to hold for a couple seconds at the top of end range  Supine Bridge - 1-2 x daily - 5 x weekly - 2-3 sets - 5 reps - cues for proper breathing while performing  Seated Hip Adduction Isometrics with Ball - 1-2 x daily - 5 x weekly - 2 sets - 10 reps Romberg Stance  Eyes Closed on Foam Pad - 1 x daily - 5 x weekly - 3 sets - 30 hold - with feet slightly apart on single pillow   Pt also demonstrating seated hip ABD with red tband that he has been performing from previous HEP from pelvic health PT x10 reps.   2 x 5 reps mini squats - at the countertop with chair posteriorly, pt needing verbal, demo and manual cues to shift hips back for proper technique as pt with tendency to perform initially with incr knee flexion over ankles.   PT Education - 09/11/20 1534    Education Details progress towards goals, reviewed HEP, continuing for an additional 1x week for 4 weeks for balance/BLE strengthening    Person(s) Educated Patient    Methods Explanation;Demonstration;Tactile cues;Verbal cues    Comprehension Verbalized understanding;Returned demonstration            PT Short Term Goals - 09/11/20 1458      PT SHORT TERM GOAL #1   Title Pt will be independent with initial HEP in order to build upon functional gains made in therapy. ALL STGS DUE 09/04/20    Baseline reviewed on 09/11/20    Time 4    Period Weeks    Status Achieved    Target Date 09/04/20      PT SHORT TERM GOAL #2   Title Pt will undergo further assessment of B AFOs in order to improve gait and to decr fall risk.    Baseline pt has gotten L AFO from [facility]    Time 4    Period Weeks    Status Achieved      PT SHORT TERM GOAL #3   Title Will assess stairs with LTG to be written as appropriate to improve safety with stair negotiation in pt's home.    Time 4    Period Weeks    Status New      PT SHORT TERM GOAL #4   Title Pt will improve 5x sit <> stand to 18 seconds or less with BUE support in order to demo improved balance, decr fall risk.    Baseline 21.32 seconds with BUE support from chair, wide BOS, incr knee flexion when standing; 14.79 without UE support from  chair min guard at times for balance    Time 4    Period Weeks    Status Achieved      PT SHORT TERM GOAL #5   Title  Pt will perform TUG in 15 seconds or less in order to demo decr fall risk.    Baseline 16.75, 13.82 seconds    Time 4    Period Weeks    Status Achieved             PT Long Term Goals - 09/11/20 1539      PT LONG TERM GOAL #1   Title Pt will be independent with final HEP in order to build upon functional gains made in therapy.    Time 8    Period Weeks    Status New      PT LONG TERM GOAL #2   Title Pt will improve 5x sit <> stand to 14 seconds or less without BUE support in order to demo improved balance with no posterior LOB or bracing and to decr fall risk.    Baseline 14.79 without UE support from chair min guard at times for balance    Time 8    Period Weeks    Status Revised      PT LONG TERM GOAL #3   Title Pt will ambulate outdoors over unlevel surfaces at least 500' with B AFOs and supervision in order to demo improved community mobility.    Time 8    Period Weeks    Status New      PT LONG TERM GOAL #4   Title Pt will improve gait speed to at least 2.3 ft/sec in order to demo improved community mobility.    Baseline 1.87 ft/sec with no AFOs.    Time 8    Period Weeks    Status New      PT LONG TERM GOAL #5   Title Pt will perform TUG in 13.0 seconds or less in order to demo decr fall risk.    Baseline 16.75 no AD, 13.82 seconds no AD    Time 8    Period Weeks    Status Revised                 Plan - 09/11/20 1545    Clinical Impression Statement Pt returns to PT today after receiving his L custom AFO from [facility]. Assessed pt's STGs with pt achieving 4 out of 5. Pt improved TUG score to 13.82 seconds with no AD (previously 16.75 seconds) decr pt's risk of falls. Pt improved sit <> stand time to 14.79 seconds without UE support and pt needing min guard at times due to almost losing balance posteriorly. Remainder of session focused on reviewing pt's HEP for BLE strength and balance. Discussed with pt adding additional visits 1x week for 4 weeks (due to  co-pay) to continue to work on strengthening and balance. LTGs revised as appropriate.    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    Examination-Activity Limitations Locomotion Level;Stand;Transfers;Stairs    Examination-Participation Restrictions Yard Work;Community Activity    Stability/Clinical Decision Making Evolving/Moderate complexity    Rehab Potential Good    PT Frequency 2x / week    PT Duration 8 weeks    PT Treatment/Interventions ADLs/Self Care Home Management;Gait training;Stair training;DME Instruction;Functional mobility training;Therapeutic activities;Therapeutic exercise;Orthotic Fit/Training;Patient/family  education;Neuromuscular re-education;Balance training;Passive range of motion;Vestibular    PT Next Visit Plan BLE strength (esp hip), retro gait, balance on different surfaces, scifit    PT Home Exercise Plan [email or web address]    Consulted and Agree with Plan of Care Patient           Patient will benefit from skilled therapeutic intervention in order to improve the following deficits and impairments:  Abnormal gait,Decreased activity tolerance,Decreased balance,Decreased range of motion,Difficulty walking,Decreased strength,Impaired flexibility,Impaired sensation,Postural dysfunction  Visit Diagnosis: Unsteadiness on feet  Muscle weakness (generalized)  Other abnormalities of gait and mobility  Foot drop, left  Difficulty in walking, not elsewhere classified  Foot drop, right     Problem List Patient Active Problem List   Diagnosis Date Noted  . Left rotator cuff tear 10/22/2018  . Risk for coronary artery disease between 10% and 20% in next 10 years 11/27/2016  . Prostate cancer ([place]) 09/14/2012  . Abnormal gait 05/21/2012  . HTN (hypertension)  02/03/2012  . Peripheral neuropathy 02/03/2012    Arliss Journey, PT, DPT  09/11/2020, 3:46 PM  [facility] [address] [place], [place], [address] Phone: [phone]   Fax:  [phone]  Name: GABIEL YANT MRN: [id number] Date of Birth: 1955/12/14

## 2020-09-20 ENCOUNTER — Ambulatory Visit: Payer: Medicare HMO | Admitting: Physical Therapy

## 2020-09-20 ENCOUNTER — Encounter: Payer: Self-pay | Admitting: Physical Therapy

## 2020-09-20 ENCOUNTER — Other Ambulatory Visit: Payer: Self-pay

## 2020-09-20 DIAGNOSIS — M6281 Muscle weakness (generalized): Secondary | ICD-10-CM | POA: Diagnosis not present

## 2020-09-20 DIAGNOSIS — R2689 Other abnormalities of gait and mobility: Secondary | ICD-10-CM | POA: Diagnosis not present

## 2020-09-20 DIAGNOSIS — M21371 Foot drop, right foot: Secondary | ICD-10-CM | POA: Diagnosis not present

## 2020-09-20 DIAGNOSIS — M21372 Foot drop, left foot: Secondary | ICD-10-CM | POA: Diagnosis not present

## 2020-09-20 DIAGNOSIS — R2681 Unsteadiness on feet: Secondary | ICD-10-CM

## 2020-09-20 DIAGNOSIS — R262 Difficulty in walking, not elsewhere classified: Secondary | ICD-10-CM | POA: Diagnosis not present

## 2020-09-20 NOTE — Therapy (Signed)
[facility] [address] [place], [place], [address] Phone: [phone]   Fax:  [phone]  Physical Therapy Treatment  Patient Details  Name: Travis Ellis MRN: [id number] Date of Birth: [birth year] Referring Provider (PT): Margette Fast, MD   Encounter Date: 09/20/2020   PT End of Session - 09/20/20 1124    Visit Number 5    Number of Visits 17    Date for PT Re-Evaluation 11/05/20   written for 60 day POC   Authorization Type [employer]    PT Start Time 1017    PT Stop Time 1100    PT Time Calculation (min) 43 min    Equipment Utilized During Treatment Gait belt    Activity Tolerance Patient tolerated treatment well    Behavior During Therapy [facility] for tasks assessed/performed           Past Medical History:  Diagnosis Date  . Allergy   . Arthritis   . Cancer [facility]) 2008   prostate  . GERD (gastroesophageal reflux disease)   . Hyperlipidemia   . Hypertension   . Neuromuscular disorder ([place])   . Prostate cancer ([place])    pT2aN0Mx adenocarcinoma of the prostate    Past Surgical History:  Procedure Laterality Date  . CHOLECYSTECTOMY N/A 03/16/2015   Procedure: LAPAROSCOPIC CHOLECYSTECTOMY WITH INTRAOPERATIVE CHOLANGIOGRAM;  Surgeon: [name], MD;  Location: [facility];  Service: General;  Laterality: N/A;  . JOINT REPLACEMENT Right 08/2009   knee  . REPLACEMENT TOTAL KNEE  2010   right knee  . ROTATOR CUFF REPAIR Left 06/21/2015    There were no vitals filed for this visit.   Subjective Assessment - 09/20/20 1021    Subjective Haven't felt like doing anything. Has not been wearing his AFO at home.    Pertinent History HLD, HTN, prostate cancer, R total knee replacement, B rotator cuff issues    Limitations Walking    How long can you walk comfortably? approx. 15 minutes    Patient Stated Goals wants to stabilize where he is, find some improvement, doesn't want to get any worse.    Currently  in Pain? No/denies                             [facility] Adult PT Treatment/Exercise - 09/20/20 1029      Exercises   Exercises Knee/Hip;Other Exercises      Knee/Hip Exercises: Stretches   Passive Hamstring Stretch Both;2 reps;30 seconds    Passive Hamstring Stretch Limitations at end of session, cues for proper technique      Knee/Hip Exercises: Aerobic   Stepper SciFit at gear 5.0 with BLE and BUE for strengthening, ROM, and activity tolerance, cues for rpm at 60      Knee/Hip Exercises: Standing   Knee Flexion Strengthening;Both;2 sets;5 reps    Knee Flexion Limitations with 2# ankle weight    Hip Abduction Stengthening;Both;1 set;10 reps    Abduction Limitations cues for slightly bent knee in stance leg for closed chain hip ABD activation    Hip Extension Stengthening;Both;1 set;10 reps    Extension Limitations wiht 2# ankle weight, verbal and demo cues for proper technique    Other Standing Knee Exercises x10 reps mini squat with chair posteriorly to pt for cue for proper technique, additional manual cues to help shift hips back    Other Standing Knee Exercises holding in mini squat position, side stepping down  and back on outside of // bars, x2 reps down and back, then performing 2 reps down and back with use of red tband      Knee/Hip Exercises: Supine   Knee Flexion Strengthening;AROM;1 set;10 reps;Both    Knee Flexion Limitations with use of red physioball, cue for initial heel dig and bending knee and slowly kicking leg out                    PT Short Term Goals - 09/11/20 1458      PT SHORT TERM GOAL #1   Title Pt will be independent with initial HEP in order to build upon functional gains made in therapy. ALL STGS DUE 09/04/20    Baseline reviewed on 09/11/20    Time 4    Period Weeks    Status Achieved    Target Date 09/04/20      PT SHORT TERM GOAL #2   Title Pt will undergo further assessment of B AFOs in order to improve gait and to  decr fall risk.    Baseline pt has gotten L AFO from [employer]    Time 4    Period Weeks    Status Achieved      PT SHORT TERM GOAL #3   Title Will assess stairs with LTG to be written as appropriate to improve safety with stair negotiation in pt's home.    Time 4    Period Weeks    Status New      PT SHORT TERM GOAL #4   Title Pt will improve 5x sit <> stand to 18 seconds or less with BUE support in order to demo improved balance, decr fall risk.    Baseline 21.32 seconds with BUE support from chair, wide BOS, incr knee flexion when standing; 14.79 without UE support from chair min guard at times for balance    Time 4    Period Weeks    Status Achieved      PT SHORT TERM GOAL #5   Title Pt will perform TUG in 15 seconds or less in order to demo decr fall risk.    Baseline 16.75, 13.82 seconds    Time 4    Period Weeks    Status Achieved             PT Long Term Goals - 09/11/20 1539      PT LONG TERM GOAL #1   Title Pt will be independent with final HEP in order to build upon functional gains made in therapy.    Time 8    Period Weeks    Status New      PT LONG TERM GOAL #2   Title Pt will improve 5x sit <> stand to 14 seconds or less without BUE support in order to demo improved balance with no posterior LOB or bracing and to decr fall risk.    Baseline 14.79 without UE support from chair min guard at times for balance    Time 8    Period Weeks    Status Revised      PT LONG TERM GOAL #3   Title Pt will ambulate outdoors over unlevel surfaces at least 500' with B AFOs and supervision in order to demo improved community mobility.    Time 8    Period Weeks    Status New      PT LONG TERM GOAL #4   Title Pt will improve gait speed to at least  2.3 ft/sec in order to demo improved community mobility.    Baseline 1.87 ft/sec with no AFOs.    Time 8    Period Weeks    Status New      PT LONG TERM GOAL #5   Title Pt will perform TUG in 13.0 seconds or less in order  to demo decr fall risk.    Baseline 16.75 no AD, 13.82 seconds no AD    Time 8    Period Weeks    Status Revised                 Plan - 09/20/20 1135    Clinical Impression Statement Today's skilled session focused on BLE strengthening with rest breaks taken as needed. Pt more challenged by knee flexion and hamstring strengthening exercises (will need to add to HEP at a future visit). Pt needing manual and demo/verbal cues for proper technique on mini squats. Will continue to progress towards LTGs.    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    Examination-Activity Limitations Locomotion Level;Stand;Transfers;Stairs    Examination-Participation Restrictions Yard Work;Community Activity    Stability/Clinical Decision Making Evolving/Moderate complexity    Rehab Potential Good    PT Frequency 2x / week    PT Duration 8 weeks    PT Treatment/Interventions ADLs/Self Care Home Management;Gait training;Stair training;DME Instruction;Functional mobility training;Therapeutic activities;Therapeutic exercise;Orthotic Fit/Training;Patient/family education;Neuromuscular re-education;Balance training;Passive range of motion;Vestibular    PT Next Visit Plan BLE strength, add knee flexion/hamstring stretching to HEP, balance on different surfaces    PT Home Exercise Plan [email or web address]    Consulted and Agree with Plan of Care Patient           Patient will benefit from skilled therapeutic intervention in order to improve the following deficits and impairments:  Abnormal gait,Decreased activity tolerance,Decreased balance,Decreased range of motion,Difficulty walking,Decreased strength,Impaired flexibility,Impaired sensation,Postural dysfunction  Visit Diagnosis: Unsteadiness on  feet  Muscle weakness (generalized)  Other abnormalities of gait and mobility  Difficulty in walking, not elsewhere classified     Problem List Patient Active Problem List   Diagnosis Date Noted  . Left rotator cuff tear 10/22/2018  . Risk for coronary artery disease between 10% and 20% in next 10 years 11/27/2016  . Prostate cancer ([place]) 09/14/2012  . Abnormal gait 05/21/2012  . HTN (hypertension) 02/03/2012  . Peripheral neuropathy 02/03/2012    Arliss Journey, PT, DPT 09/20/2020, 11:36 AM  [facility] [address] [place], [place], [address] Phone: [phone]   Fax:  [phone]  Name: ORVILE CORONA MRN: [id number] Date of Birth: 03/09/1956

## 2020-09-27 ENCOUNTER — Encounter: Payer: Self-pay | Admitting: Physical Therapy

## 2020-09-27 ENCOUNTER — Ambulatory Visit: Payer: Medicare HMO | Admitting: Physical Therapy

## 2020-09-27 ENCOUNTER — Other Ambulatory Visit: Payer: Self-pay

## 2020-09-27 DIAGNOSIS — R2681 Unsteadiness on feet: Secondary | ICD-10-CM

## 2020-09-27 DIAGNOSIS — M21372 Foot drop, left foot: Secondary | ICD-10-CM | POA: Diagnosis not present

## 2020-09-27 DIAGNOSIS — M21371 Foot drop, right foot: Secondary | ICD-10-CM | POA: Diagnosis not present

## 2020-09-27 DIAGNOSIS — M6281 Muscle weakness (generalized): Secondary | ICD-10-CM | POA: Diagnosis not present

## 2020-09-27 DIAGNOSIS — R2689 Other abnormalities of gait and mobility: Secondary | ICD-10-CM | POA: Diagnosis not present

## 2020-09-27 DIAGNOSIS — R262 Difficulty in walking, not elsewhere classified: Secondary | ICD-10-CM | POA: Diagnosis not present

## 2020-09-27 NOTE — Therapy (Signed)
[facility] [address] [place], [place], [address] Phone: [phone]   Fax:  [phone]  Physical Therapy Treatment  Patient Details  Name: Travis Ellis MRN: [id number] Date of Birth: 30-Dec-1955 Referring Provider (PT): Margette Fast, MD   Encounter Date: 09/27/2020   PT End of Session - 09/27/20 1704    Visit Number 6    Number of Visits 17    Date for PT Re-Evaluation 11/05/20   written for 60 day POC   Authorization Type [employer] Medicare    PT Start Time 1447    PT Stop Time 1527    PT Time Calculation (min) 40 min    Equipment Utilized During Treatment Gait belt    Activity Tolerance Patient tolerated treatment well    Behavior During Therapy [facility] for tasks assessed/performed           Past Medical History:  Diagnosis Date  . Allergy   . Arthritis   . Cancer [facility]) 2008   prostate  . GERD (gastroesophageal reflux disease)   . Hyperlipidemia   . Hypertension   . Neuromuscular disorder ([place])   . Prostate cancer ([place])    pT2aN0Mx adenocarcinoma of the prostate    Past Surgical History:  Procedure Laterality Date  . CHOLECYSTECTOMY N/A 03/16/2015   Procedure: LAPAROSCOPIC CHOLECYSTECTOMY WITH INTRAOPERATIVE CHOLANGIOGRAM;  Surgeon: [name], MD;  Location: [facility];  Service: General;  Laterality: N/A;  . JOINT REPLACEMENT Right 08/2009   knee  . REPLACEMENT TOTAL KNEE  2010   right knee  . ROTATOR CUFF REPAIR Left 06/21/2015    There were no vitals filed for this visit.   Subjective Assessment - 09/27/20 1451    Subjective No changes since he was last here. When he wears his AFO he notices it is making a big difference.    Pertinent History HLD, HTN, prostate cancer, R total knee replacement, B rotator cuff issues    Limitations Walking    How long can you walk comfortably? approx. 15 minutes    Patient Stated Goals wants to stabilize where he is, find some improvement, doesn't want to  get any worse.    Currently in Pain? No/denies                             [facility] Adult PT Treatment/Exercise - 09/27/20 0001      Knee/Hip Exercises: Standing   Knee Flexion Strengthening;Both;2 sets;10 reps    Knee Flexion Limitations with 2# ankle weight      Knee/Hip Exercises: Supine   Bridges Strengthening;Both;2 sets;5 reps    Bridges Limitations cues for breathing    Knee Flexion Strengthening;AROM;1 set;10 reps;Both    Knee Flexion Limitations with use of red physioball, cue for initial heel dig and bending knee and slowly kicking leg out    Other Supine Knee/Hip Exercises bridging with red physioball underneath BLE x10 reps with legs extended    Other Supine Knee/Hip Exercises hip ADD squeezes with ball x10 reps      Knee/Hip Exercises: Sidelying   Hip ABduction Strengthening;Both;1 set;10 reps    Hip ABduction Limitations with therapist assisting pt holding it in proper position, plus additional x5 reps B with circling leg forwards and backwards               Balance Exercises - 09/27/20 0001      Balance Exercises: Standing   Stepping Strategy Anterior;Foam/compliant surface;UE  support;10 reps;Limitations    Stepping Strategy Limitations standing on blue foam beam alternating forward stepping with UE support    Rockerboard Anterior/posterior;EO;Limitations    Rockerboard Limitations weight shifting back onto heels and then back to midline x10 reps with UE support (verbal and demo cues with how to perform properly), keeping board still head turns x10 reps with UE support               PT Short Term Goals - 09/11/20 1458      PT SHORT TERM GOAL #1   Title Pt will be independent with initial HEP in order to build upon functional gains made in therapy. ALL STGS DUE 09/04/20    Baseline reviewed on 09/11/20    Time 4    Period Weeks    Status Achieved    Target Date 09/04/20      PT SHORT TERM GOAL #2   Title Pt will undergo further  assessment of B AFOs in order to improve gait and to decr fall risk.    Baseline pt has gotten L AFO from [employer]    Time 4    Period Weeks    Status Achieved      PT SHORT TERM GOAL #3   Title Will assess stairs with LTG to be written as appropriate to improve safety with stair negotiation in pt's home.    Time 4    Period Weeks    Status New      PT SHORT TERM GOAL #4   Title Pt will improve 5x sit <> stand to 18 seconds or less with BUE support in order to demo improved balance, decr fall risk.    Baseline 21.32 seconds with BUE support from chair, wide BOS, incr knee flexion when standing; 14.79 without UE support from chair min guard at times for balance    Time 4    Period Weeks    Status Achieved      PT SHORT TERM GOAL #5   Title Pt will perform TUG in 15 seconds or less in order to demo decr fall risk.    Baseline 16.75, 13.82 seconds    Time 4    Period Weeks    Status Achieved             PT Long Term Goals - 09/11/20 1539      PT LONG TERM GOAL #1   Title Pt will be independent with final HEP in order to build upon functional gains made in therapy.    Time 8    Period Weeks    Status New      PT LONG TERM GOAL #2   Title Pt will improve 5x sit <> stand to 14 seconds or less without BUE support in order to demo improved balance with no posterior LOB or bracing and to decr fall risk.    Baseline 14.79 without UE support from chair min guard at times for balance    Time 8    Period Weeks    Status Revised      PT LONG TERM GOAL #3   Title Pt will ambulate outdoors over unlevel surfaces at least 500' with B AFOs and supervision in order to demo improved community mobility.    Time 8    Period Weeks    Status New      PT LONG TERM GOAL #4   Title Pt will improve gait speed to at least 2.3 ft/sec in order to  demo improved community mobility.    Baseline 1.87 ft/sec with no AFOs.    Time 8    Period Weeks    Status New      PT LONG TERM GOAL #5   Title  Pt will perform TUG in 13.0 seconds or less in order to demo decr fall risk.    Baseline 16.75 no AD, 13.82 seconds no AD    Time 8    Period Weeks    Status Revised                 Plan - 09/27/20 1705    Clinical Impression Statement Today's skilled session focused on BLE strengthening and standing balance on compliant surfaces. Pt with improvements in B knee flexion strengthening today. Pt challenged by the rockerboard today for balance needing UE support. Will continue to progress towards LTGs.    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    Examination-Activity Limitations Locomotion Level;Stand;Transfers;Stairs    Examination-Participation Restrictions Yard Work;Community Activity    Stability/Clinical Decision Making Evolving/Moderate complexity    Rehab Potential Good    PT Frequency 2x / week    PT Duration 8 weeks    PT Treatment/Interventions ADLs/Self Care Home Management;Gait training;Stair training;DME Instruction;Functional mobility training;Therapeutic activities;Therapeutic exercise;Orthotic Fit/Training;Patient/family education;Neuromuscular re-education;Balance training;Passive range of motion;Vestibular    PT Next Visit Plan stairs for strength. BLE strength, add knee flexion/hamstring stretching to HEP, balance on different surfaces    PT Home Exercise Plan [email or web address]    Consulted and Agree with Plan of Care Patient           Patient will benefit from skilled therapeutic intervention in order to improve the following deficits and impairments:  Abnormal gait,Decreased activity tolerance,Decreased balance,Decreased range of motion,Difficulty walking,Decreased strength,Impaired flexibility,Impaired sensation,Postural dysfunction  Visit  Diagnosis: Unsteadiness on feet  Muscle weakness (generalized)  Other abnormalities of gait and mobility  Difficulty in walking, not elsewhere classified     Problem List Patient Active Problem List   Diagnosis Date Noted  . Left rotator cuff tear 10/22/2018  . Risk for coronary artery disease between 10% and 20% in next 10 years 11/27/2016  . Prostate cancer ([place]) 09/14/2012  . Abnormal gait 05/21/2012  . HTN (hypertension) 02/03/2012  . Peripheral neuropathy 02/03/2012    Arliss Journey, PT, DPT  09/27/2020, 5:08 PM  [facility] [address] [place], [place], [address] Phone: [phone]   Fax:  [phone]  Name: Travis Ellis MRN: [id number] Date of Birth: 1956/01/18

## 2020-10-04 ENCOUNTER — Other Ambulatory Visit: Payer: Self-pay

## 2020-10-04 ENCOUNTER — Encounter: Payer: Self-pay | Admitting: Physical Therapy

## 2020-10-04 ENCOUNTER — Ambulatory Visit: Payer: Medicare HMO | Attending: Neurology | Admitting: Physical Therapy

## 2020-10-04 DIAGNOSIS — R2681 Unsteadiness on feet: Secondary | ICD-10-CM

## 2020-10-04 DIAGNOSIS — R2689 Other abnormalities of gait and mobility: Secondary | ICD-10-CM | POA: Diagnosis not present

## 2020-10-04 DIAGNOSIS — M6281 Muscle weakness (generalized): Secondary | ICD-10-CM | POA: Diagnosis not present

## 2020-10-04 DIAGNOSIS — R262 Difficulty in walking, not elsewhere classified: Secondary | ICD-10-CM

## 2020-10-04 DIAGNOSIS — M21372 Foot drop, left foot: Secondary | ICD-10-CM | POA: Diagnosis not present

## 2020-10-04 DIAGNOSIS — M21371 Foot drop, right foot: Secondary | ICD-10-CM

## 2020-10-06 NOTE — Therapy (Signed)
[facility] [address] [place], [place], [address] Phone: [phone]   Fax:  [phone]  Physical Therapy Treatment  Patient Details  Name: Travis Ellis MRN: [id number] Date of Birth: December 09, 1955 Referring Provider (PT): Margette Fast, MD   Encounter Date: 10/04/2020     10/04/20 1451  PT Visits / Re-Eval  Visit Number 7  Number of Visits 17  Date for PT Re-Evaluation 11/05/20 (written for 60 day POC)  Authorization  Authorization Type [employer]  PT Time Calculation  PT Start Time 1448  PT Stop Time 1528  PT Time Calculation (min) 40 min  PT - End of Session  Equipment Utilized During Treatment Gait belt  Activity Tolerance Patient tolerated treatment well  Behavior During Therapy [facility] for tasks assessed/performed    Past Medical History:  Diagnosis Date  . Allergy   . Arthritis   . Cancer [facility]) 2008   prostate  . GERD (gastroesophageal reflux disease)   . Hyperlipidemia   . Hypertension   . Neuromuscular disorder ([place])   . Prostate cancer ([place])    pT2aN0Mx adenocarcinoma of the prostate    Past Surgical History:  Procedure Laterality Date  . CHOLECYSTECTOMY N/A 03/16/2015   Procedure: LAPAROSCOPIC CHOLECYSTECTOMY WITH INTRAOPERATIVE CHOLANGIOGRAM;  Surgeon: [name], MD;  Location: [facility];  Service: General;  Laterality: N/A;  . JOINT REPLACEMENT Right 08/2009   knee  . REPLACEMENT TOTAL KNEE  2010   right knee  . ROTATOR CUFF REPAIR Left 06/21/2015    There were no vitals filed for this visit.      10/04/20 1451  Symptoms/Limitations  Subjective No new complaints. No falls to report, states multiple near falls. No pain to report.  Pertinent History HLD, HTN, prostate cancer, R total knee replacement, B rotator cuff issues  Limitations Walking  How long can you walk comfortably? approx. 15 minutes  Patient Stated Goals wants to stabilize where he is, find some improvement,  doesn't want to get any worse.  Pain Assessment  Currently in Pain? No/denies     10/04/20 1453  Transfers  Transfers Sit to Stand;Stand to Sit  Sit to Stand 6: Modified independent (Device/Increase time)  Stand to Sit 6: Modified independent (Device/Increase time)  Ambulation/Gait  Ambulation/Gait Yes  Ambulation/Gait Assistance 5: Supervision;4: Min guard  Ambulation/Gait Assistance Details around gym with session  Assistive device None;Other (Comment) (left AFO)  Gait Pattern Step-through pattern;Decreased arm swing - right;Decreased arm swing - left;Decreased dorsiflexion - right;Decreased dorsiflexion - left;Right steppage;Left steppage;Wide base of support;Poor foot clearance - left;Poor foot clearance - right;Decreased trunk rotation;Decreased stride length;Left flexed knee in stance;Right flexed knee in stance  Ambulation Surface Level;Indoor  High Level Balance  High Level Balance Activities Side stepping;Marching forwards;Marching backwards;Tandem walking (tandem fwd/bwd)  High Level Balance Comments on blue mat in parallel bars: light UE support for 4 laps each/each way  Neuro Re-ed   Neuro Re-ed Details  for balance/muscle re-ed: gait around track working on speed changes, scanning all directions, sudden stops/starts, min guard assist for safety.      10/04/20 1507  Balance Exercises: Standing  Rockerboard Anterior/posterior;EC;30 seconds;Other reps (comment);Limitations;Intermittent UE support;Lateral;Head turns (light touch with head movements at times)  Rockerboard Limitations performed both ways on balance board while holding it steady: EC 30 sec's x 3 reps, progressing to EC head movements left<>right, up<>down for ~10 reps. min guard to min assist with occasional touch to bars for balance. cues needed on posture and weight  shifting to assist with balance.  Balance Beam standing across blue foam beam: with light to no UE support for alternating forward stepping to  floor/back onto beam, then alternating backward stepping to floor/back onto beam for ~10 reps each. cues for increased step length/height to clear beam surface. Min guard to min assist for balance.         PT Short Term Goals - 09/11/20 1458      PT SHORT TERM GOAL #1   Title Pt will be independent with initial HEP in order to build upon functional gains made in therapy. ALL STGS DUE 09/04/20    Baseline reviewed on 09/11/20    Time 4    Period Weeks    Status Achieved    Target Date 09/04/20      PT SHORT TERM GOAL #2   Title Pt will undergo further assessment of B AFOs in order to improve gait and to decr fall risk.    Baseline pt has gotten L AFO from [employer]    Time 4    Period Weeks    Status Achieved      PT SHORT TERM GOAL #3   Title Will assess stairs with LTG to be written as appropriate to improve safety with stair negotiation in pt's home.    Time 4    Period Weeks    Status New      PT SHORT TERM GOAL #4   Title Pt will improve 5x sit <> stand to 18 seconds or less with BUE support in order to demo improved balance, decr fall risk.    Baseline 21.32 seconds with BUE support from chair, wide BOS, incr knee flexion when standing; 14.79 without UE support from chair min guard at times for balance    Time 4    Period Weeks    Status Achieved      PT SHORT TERM GOAL #5   Title Pt will perform TUG in 15 seconds or less in order to demo decr fall risk.    Baseline 16.75, 13.82 seconds    Time 4    Period Weeks    Status Achieved             PT Long Term Goals - 09/11/20 1539      PT LONG TERM GOAL #1   Title Pt will be independent with final HEP in order to build upon functional gains made in therapy.    Time 8    Period Weeks    Status New      PT LONG TERM GOAL #2   Title Pt will improve 5x sit <> stand to 14 seconds or less without BUE support in order to demo improved balance with no posterior LOB or bracing and to decr fall risk.    Baseline 14.79  without UE support from chair min guard at times for balance    Time 8    Period Weeks    Status Revised      PT LONG TERM GOAL #3   Title Pt will ambulate outdoors over unlevel surfaces at least 500' with B AFOs and supervision in order to demo improved community mobility.    Time 8    Period Weeks    Status New      PT LONG TERM GOAL #4   Title Pt will improve gait speed to at least 2.3 ft/sec in order to demo improved community mobility.    Baseline 1.87 ft/sec with no AFOs.  Time 8    Period Weeks    Status New      PT LONG TERM GOAL #5   Title Pt will perform TUG in 13.0 seconds or less in order to demo decr fall risk.    Baseline 16.75 no AD, 13.82 seconds no AD    Time 8    Period Weeks    Status Revised              10/04/20 1452  Plan  Clinical Impression Statement Today's skilled session focused on balance training with no issues noted or reported in session. The pt is making steady progress toward goals.  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  Examination-Activity Limitations Locomotion Level;Stand;Transfers;Stairs  Examination-Participation Restrictions Yard Work;Community Activity  Pt will benefit from skilled therapeutic intervention in order to improve on the following deficits Abnormal gait;Decreased activity tolerance;Decreased balance;Decreased range of motion;Difficulty walking;Decreased strength;Impaired flexibility;Impaired sensation;Postural dysfunction  Stability/Clinical Decision Making Evolving/Moderate complexity  Rehab Potential Good  PT Frequency 2x / week  PT Duration 8 weeks  PT Treatment/Interventions ADLs/Self Care Home Management;Gait training;Stair training;DME Instruction;Functional mobility training;Therapeutic  activities;Therapeutic exercise;Orthotic Fit/Training;Patient/family education;Neuromuscular re-education;Balance training;Passive range of motion;Vestibular  PT Next Visit Plan has one visit left, assess LTGs.  PT Home Exercise Plan [email or web address]  Consulted and Agree with Plan of Care Patient         Patient will benefit from skilled therapeutic intervention in order to improve the following deficits and impairments:  Abnormal gait,Decreased activity tolerance,Decreased balance,Decreased range of motion,Difficulty walking,Decreased strength,Impaired flexibility,Impaired sensation,Postural dysfunction  Visit Diagnosis: Unsteadiness on feet  Muscle weakness (generalized)  Other abnormalities of gait and mobility  Difficulty in walking, not elsewhere classified  Foot drop, left  Foot drop, right     Problem List Patient Active Problem List   Diagnosis Date Noted  . Left rotator cuff tear 10/22/2018  . Risk for coronary artery disease between 10% and 20% in next 10 years 11/27/2016  . Prostate cancer ([place]) 09/14/2012  . Abnormal gait 05/21/2012  . HTN (hypertension) 02/03/2012  . Peripheral neuropathy 02/03/2012    Willow Ora, PTA, [facility] [address], [place], [place] [address] [phone] 10/06/20, 10:35 AM   Name: Travis Ellis MRN: [id number] Date of Birth: June 13, 1956

## 2020-10-11 ENCOUNTER — Ambulatory Visit: Payer: Medicare HMO | Admitting: Physical Therapy

## 2020-10-11 ENCOUNTER — Other Ambulatory Visit: Payer: Self-pay

## 2020-10-11 ENCOUNTER — Encounter: Payer: Self-pay | Admitting: Physical Therapy

## 2020-10-11 DIAGNOSIS — R262 Difficulty in walking, not elsewhere classified: Secondary | ICD-10-CM

## 2020-10-11 DIAGNOSIS — M6281 Muscle weakness (generalized): Secondary | ICD-10-CM

## 2020-10-11 DIAGNOSIS — R2681 Unsteadiness on feet: Secondary | ICD-10-CM | POA: Diagnosis not present

## 2020-10-11 DIAGNOSIS — R2689 Other abnormalities of gait and mobility: Secondary | ICD-10-CM

## 2020-10-11 DIAGNOSIS — M21371 Foot drop, right foot: Secondary | ICD-10-CM | POA: Diagnosis not present

## 2020-10-11 DIAGNOSIS — M21372 Foot drop, left foot: Secondary | ICD-10-CM | POA: Diagnosis not present

## 2020-10-11 NOTE — Patient Instructions (Signed)
Access Code: [email or web address] URL: [email or web address] Date: 10/11/2020 Prepared by: Janann August  Exercises Sit to Stand - 1-2 x daily - 5 x weekly - 2 sets - 5 reps Side Stepping with Counter Support - 1-2 x daily - 5 x weekly - 3 sets Clamshell with Resistance - 1-2 x daily - 5 x weekly - 2 sets - 10 reps Supine Bridge - 1-2 x daily - 5 x weekly - 2-3 sets - 5 reps Seated Hip Adduction Isometrics with Ball - 1-2 x daily - 5 x weekly - 2 sets - 10 reps Standing Knee Flexion AROM with Chair Support - 1 x daily - 5 x weekly - 1-2 sets - 10 reps Mini Squat with Counter Support - 1 x daily - 5 x weekly - 1-2 sets - 10 reps

## 2020-10-11 NOTE — Therapy (Signed)
[facility] [address] [place], [place], [address] Phone: [phone]   Fax:  [phone]  Physical Therapy Treatment/Discharge Summary  Patient Details  Name: Travis Ellis MRN: [id number] Date of Birth: 1955/12/20 Referring Provider (PT): Margette Fast, MD   Encounter Date: 10/11/2020   PT End of Session - 10/12/20 0859    Visit Number 8    Number of Visits 17    Date for PT Re-Evaluation 11/05/20   written for 60 day POC   Authorization Type A[employer]na Medicare    PT Start Time 1447   full time not used due to D/C visit   PT Stop Time 1525    PT Time Calculation (min) 38 min    Activity Tolerance Patient tolerated treatment well    Behavior During Therapy [facility] for tasks assessed/performed           Past Medical History:  Diagnosis Date  . Allergy   . Arthritis   . Cancer [facility]) 2008   prostate  . GERD (gastroesophageal reflux disease)   . Hyperlipidemia   . Hypertension   . Neuromuscular disorder ([place])   . Prostate cancer ([place])    pT2aN0Mx adenocarcinoma of the prostate    Past Surgical History:  Procedure Laterality Date  . CHOLECYSTECTOMY N/A 03/16/2015   Procedure: LAPAROSCOPIC CHOLECYSTECTOMY WITH INTRAOPERATIVE CHOLANGIOGRAM;  Surgeon: [name], MD;  Location: [facility];  Service: General;  Laterality: N/A;  . JOINT REPLACEMENT Right 08/2009   knee  . REPLACEMENT TOTAL KNEE  2010   right knee  . ROTATOR CUFF REPAIR Left 06/21/2015    There were no vitals filed for this visit.   Subjective Assessment - 10/11/20 1451    Subjective No falls to report.    Pertinent History HLD, HTN, prostate cancer, R total knee replacement, B rotator cuff issues    Limitations Walking    How long can you walk comfortably? approx. 15 minutes    Patient Stated Goals wants to stabilize where he is, find some improvement, doesn't want to get any worse.    Currently in Pain? No/denies              [facility] PT  Assessment - 10/11/20 1456      Transfers   Transfers Sit to Stand;Stand to Sit    Sit to Stand 5: Supervision    Five time sit to stand comments  9.93 seconds   no UE support, decr eccentric control at times   Stand to Sit 5: Supervision      Timed Up and Go Test   Normal TUG (seconds) 9.87    TUG Comments no AD, L AFO              Access Code: [email or web address] URL: [email or web address] Date: 10/11/2020 Prepared by: Janann August  Reviewed and finalized pt's HEP - see [place] for more details.   Exercises Sit to Stand - 1-2 x daily - 5 x weekly - 2 sets - 5 reps Side Stepping with Counter Support - 1-2 x daily - 5 x weekly - 3 sets Clamshell with Resistance - 1-2 x daily - 5 x weekly - 2 sets - 10 reps - red tband  Supine Bridge - 1-2 x daily - 5 x weekly - 2-3 sets - 5 reps Seated Hip Adduction Isometrics with Ball - 1-2 x daily - 5 x weekly - 2 sets - 10 reps Standing Knee Flexion AROM with Chair Support - 1 x daily -  5 x weekly - 1-2 sets - 10 reps Mini Squat with Counter Support - 1 x daily - 5 x weekly - 1-2 sets - 10 reps            [facility] Adult PT Treatment/Exercise - 10/11/20 1456      Ambulation/Gait   Ambulation/Gait Yes    Ambulation/Gait Assistance 5: Supervision;4: Min guard    Ambulation/Gait Assistance Details with L AFO, leaning posterior during gait    Assistive device None    Gait Pattern Step-through pattern;Decreased arm swing - right;Decreased arm swing - left;Decreased dorsiflexion - right;Decreased dorsiflexion - left;Right steppage;Left steppage;Wide base of support;Decreased trunk rotation;Decreased stride length;Left flexed knee in stance;Right flexed knee in stance    Ambulation Surface Level;Indoor    Gait velocity 16.28 seconds = 2.0 ft/sec                  PT Education - 10/11/20 2102    Education Details progress/achievement towards goals, reviewed/finalized HEP, discussed if pt notices a worsening in  balance/gait in the future after working on exercises/walking at home - will need new referral from MD to return to therapy.    Person(s) Educated Patient    Methods Explanation;Demonstration;Handout    Comprehension Verbalized understanding;Returned demonstration            PT Short Term Goals - 09/11/20 1458      PT SHORT TERM GOAL #1   Title Pt will be independent with initial HEP in order to build upon functional gains made in therapy. ALL STGS DUE 09/04/20    Baseline reviewed on 09/11/20    Time 4    Period Weeks    Status Achieved    Target Date 09/04/20      PT SHORT TERM GOAL #2   Title Pt will undergo further assessment of B AFOs in order to improve gait and to decr fall risk.    Baseline pt has gotten L AFO from [employer]    Time 4    Period Weeks    Status Achieved      PT SHORT TERM GOAL #3   Title Will assess stairs with LTG to be written as appropriate to improve safety with stair negotiation in pt's home.    Time 4    Period Weeks    Status New      PT SHORT TERM GOAL #4   Title Pt will improve 5x sit <> stand to 18 seconds or less with BUE support in order to demo improved balance, decr fall risk.    Baseline 21.32 seconds with BUE support from chair, wide BOS, incr knee flexion when standing; 14.79 without UE support from chair min guard at times for balance    Time 4    Period Weeks    Status Achieved      PT SHORT TERM GOAL #5   Title Pt will perform TUG in 15 seconds or less in order to demo decr fall risk.    Baseline 16.75, 13.82 seconds    Time 4    Period Weeks    Status Achieved             PT Long Term Goals - 10/11/20 1457      PT LONG TERM GOAL #1   Title Pt will be independent with final HEP in order to build upon functional gains made in therapy.    Baseline reviewed on 10/11/20, pt had not been performing all of them consistently.  Time 8    Period Weeks    Status Partially Met      PT LONG TERM GOAL #2   Title Pt will improve 5x  sit <> stand to 14 seconds or less without BUE support in order to demo improved balance with no posterior LOB or bracing and to decr fall risk.    Baseline 14.79 without UE support from chair min guard at times for balance, 9.93 seconds on 10/11/20 - only one instance of BLE bracing against mat    Time 8    Period Weeks    Status Achieved      PT LONG TERM GOAL #3   Title Pt will ambulate outdoors over unlevel surfaces at least 500' with B AFOs and supervision in order to demo improved community mobility.    Baseline unable to assess today due to time constraints    Time 8    Period Weeks    Status Deferred      PT LONG TERM GOAL #4   Title Pt will improve gait speed to at least 2.3 ft/sec in order to demo improved community mobility.    Baseline 1.87 ft/sec with no AFOs, 16.28 seconds = 2.0 ft/sec with L AFO    Time 8    Period Weeks    Status Not Met      PT LONG TERM GOAL #5   Title Pt will perform TUG in 13.0 seconds or less in order to demo decr fall risk.    Baseline 16.75 no AD, 13.82 seconds no AD, 9.87 seconds with no AD and L AFO on 10/11/20    Time 8    Period Weeks    Status Achieved             PHYSICAL THERAPY DISCHARGE SUMMARY  Visits from Start of Care: 8  Current functional level related to goals / functional outcomes: See LTGs.    Remaining deficits: Gait abnormalities, high level balance impairments, decr BLE strength, postural abnormalities.   Education / Equipment: HEP  Plan: Patient agrees to discharge.  Patient goals were partially met. Patient is being discharged due to meeting the stated rehab goals.  ?????          Plan - 10/12/20 0905    Clinical Impression Statement Focus of today's skilled session was assessing pt's LTGs for anticipated D/C. Pt met LTGs in regards to 5x sit <> stand and TUG, decreasing pt's risk of falls. Pt did not meet LTG in regards to gait speed - improved to 2.0 ft/sec (prior 1.87 ft/sec), but not to goal level.  Remainder of session focused on reviewing and finalizing/condensing pt's HEP for strength/balance. Pt in agreement to D/C at this time.    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    Examination-Activity Limitations Locomotion Level;Stand;Transfers;Stairs    Examination-Participation Restrictions Yard Work;Community Activity    Stability/Clinical Decision Making Evolving/Moderate complexity    Rehab Potential Good    PT Frequency 2x / week    PT Duration 8 weeks    PT Treatment/Interventions ADLs/Self Care Home Management;Gait training;Stair training;DME Instruction;Functional mobility training;Therapeutic activities;Therapeutic exercise;Orthotic Fit/Training;Patient/family education;Neuromuscular re-education;Balance training;Passive range of motion;Vestibular    PT Next Visit Plan D/C    PT Home Exercise Plan [email or web address]    Consulted and Agree with Plan of Care Patient  Patient will benefit from skilled therapeutic intervention in order to improve the following deficits and impairments:  Abnormal gait,Decreased activity tolerance,Decreased balance,Decreased range of motion,Difficulty walking,Decreased strength,Impaired flexibility,Impaired sensation,Postural dysfunction  Visit Diagnosis: Unsteadiness on feet  Muscle weakness (generalized)  Other abnormalities of gait and mobility  Difficulty in walking, not elsewhere classified     Problem List Patient Active Problem List   Diagnosis Date Noted  . Left rotator cuff tear 10/22/2018  . Risk for coronary artery disease between 10% and 20% in next 10 years 11/27/2016  . Prostate cancer ([place]) 09/14/2012  . Abnormal gait 05/21/2012  . HTN (hypertension) 02/03/2012  . Peripheral neuropathy 02/03/2012    Arliss Journey, PT, DPT  10/12/2020, 9:07 AM  [facility] [address] [place], [place], [address] Phone: [phone]   Fax:  [phone]  Name: Travis Ellis MRN: [id number] Date of Birth: [birth year]

## 2020-10-23 ENCOUNTER — Encounter: Payer: PRIVATE HEALTH INSURANCE | Admitting: Family Medicine

## 2020-11-03 ENCOUNTER — Telehealth: Payer: Self-pay | Admitting: Family Medicine

## 2020-11-03 DIAGNOSIS — R7303 Prediabetes: Secondary | ICD-10-CM

## 2020-11-03 DIAGNOSIS — Z0000 Encounter for general adult medical examination without abnormal findings: Secondary | ICD-10-CM

## 2020-11-03 DIAGNOSIS — C61 Malignant neoplasm of prostate: Secondary | ICD-10-CM

## 2020-11-03 DIAGNOSIS — E785 Hyperlipidemia, unspecified: Secondary | ICD-10-CM

## 2020-11-03 DIAGNOSIS — I10 Essential (primary) hypertension: Secondary | ICD-10-CM

## 2020-11-03 NOTE — Telephone Encounter (Signed)
Please order fasting labs for awv scheduled for 11/08/2020

## 2020-11-07 NOTE — Telephone Encounter (Signed)
ordered

## 2020-11-08 ENCOUNTER — Ambulatory Visit (INDEPENDENT_AMBULATORY_CARE_PROVIDER_SITE_OTHER): Payer: Medicare HMO | Admitting: Family Medicine

## 2020-11-08 ENCOUNTER — Encounter: Payer: Self-pay | Admitting: Family Medicine

## 2020-11-08 ENCOUNTER — Other Ambulatory Visit: Payer: Self-pay

## 2020-11-08 VITALS — BP 121/74 | HR 88 | Temp 98.0°F | Ht 72.0 in | Wt 206.0 lb

## 2020-11-08 DIAGNOSIS — C61 Malignant neoplasm of prostate: Secondary | ICD-10-CM | POA: Diagnosis not present

## 2020-11-08 DIAGNOSIS — Z0000 Encounter for general adult medical examination without abnormal findings: Secondary | ICD-10-CM | POA: Diagnosis not present

## 2020-11-08 DIAGNOSIS — I10 Essential (primary) hypertension: Secondary | ICD-10-CM

## 2020-11-08 DIAGNOSIS — R7303 Prediabetes: Secondary | ICD-10-CM

## 2020-11-08 DIAGNOSIS — E785 Hyperlipidemia, unspecified: Secondary | ICD-10-CM

## 2020-11-08 NOTE — Patient Instructions (Addendum)
  Health Maintenance After Age 67 After age 67, you are at a higher risk for certain long-term diseases and infections as well as injuries from falls. Falls are a major cause of broken bones and head injuries in people who are older than age 67. Getting regular preventive care can help to keep you healthy and well. Preventive care includes getting regular testing and making lifestyle changes as recommended by your health care provider. Talk with your health care provider about:  Which screenings and tests you should have. A screening is a test that checks for a disease when you have no symptoms.  A diet and exercise plan that is right for you. What should I know about screenings and tests to prevent falls? Screening and testing are the best ways to find a health problem early. Early diagnosis and treatment give you the best chance of managing medical conditions that are common after age 67. Certain conditions and lifestyle choices may make you more likely to have a fall. Your health care provider may recommend:  Regular vision checks. Poor vision and conditions such as cataracts can make you more likely to have a fall. If you wear glasses, make sure to get your prescription updated if your vision changes.  Medicine review. Work with your health care provider to regularly review all of the medicines you are taking, including over-the-counter medicines. Ask your health care provider about any side effects that may make you more likely to have a fall. Tell your health care provider if any medicines that you take make you feel dizzy or sleepy.  Osteoporosis screening. Osteoporosis is a condition that causes the bones to get weaker. This can make the bones weak and cause them to break more easily.  Blood pressure screening. Blood pressure changes and medicines to control blood pressure can make you feel dizzy.  Strength and balance checks. Your health care provider may recommend certain tests to  check your strength and balance while standing, walking, or changing positions.  Foot health exam. Foot pain and numbness, as well as not wearing proper footwear, can make you more likely to have a fall.  Depression screening. You may be more likely to have a fall if you have a fear of falling, feel emotionally low, or feel unable to do activities that you used to do.  Alcohol use screening. Using too much alcohol can affect your balance and may make you more likely to have a fall. What actions can I take to lower my risk of falls? General instructions  Talk with your health care provider about your risks for falling. Tell your health care provider if: ? You fall. Be sure to tell your health care provider about all falls, even ones that seem minor. ? You feel dizzy, sleepy, or off-balance.  Take over-the-counter and prescription medicines only as told by your health care provider. These include any supplements.  Eat a healthy diet and maintain a healthy weight. A healthy diet includes low-fat dairy products, low-fat (lean) meats, and fiber from whole grains, beans, and lots of fruits and vegetables. Home safety  Remove any tripping hazards, such as rugs, cords, and clutter.  Install safety equipment such as grab bars in bathrooms and safety rails on stairs.  Keep rooms and walkways well-lit. Activity  Follow a regular exercise program to stay fit. This will help you maintain your balance. Ask your health care provider what types of exercise are appropriate for you.  If you need a cane   or walker, use it as recommended by your health care provider.  Wear supportive shoes that have nonskid soles.   Lifestyle  Do not drink alcohol if your health care provider tells you not to drink.  If you drink alcohol, limit how much you have: ? 0-1 drink a day for women. ? 0-2 drinks a day for men.  Be aware of how much alcohol is in your drink. In the [place] one drink equals one typical bottle  of beer (12 oz), one-half glass of wine (5 oz), or one shot of hard liquor (1 oz).  Do not use any products that contain nicotine or tobacco, such as cigarettes and e-cigarettes. If you need help quitting, ask your health care provider. Summary  Having a healthy lifestyle and getting preventive care can help to protect your health and wellness after age 67.  Screening and testing are the best way to find a health problem early and help you avoid having a fall. Early diagnosis and treatment give you the best chance for managing medical conditions that are more common for people who are older than age 67.  Falls are a major cause of broken bones and head injuries in people who are older than age 67. Take precautions to prevent a fall at home.  Work with your health care provider to learn what changes you can make to improve your health and wellness and to prevent falls. This information is not intended to replace advice given to you by your health care provider. Make sure you discuss any questions you have with your health care provider. Document Revised: 12/10/2018 Document Reviewed: 07/02/2017 Elsevier Patient Education  2021 Elsevier Inc.   If you have lab work done today you will be contacted with your lab results within the next 2 weeks.  If you have not heard from us then please contact us. The fastest way to get your results is to register for My Chart.   IF you received an x-ray today, you will receive an invoice from [facility]. Please contact [facility] at [phone] with questions or concerns regarding your invoice.   IF you received labwork today, you will receive an invoice from [facility]. Please contact [facility] at [phone] with questions or concerns regarding your invoice.   Our billing staff will not be able to assist you with questions regarding bills from these companies.  You will be contacted with the lab results as soon as they are available.  The fastest way to get your results is to activate your My Chart account. Instructions are located on the last page of this paperwork. If you have not heard from us regarding the results in 2 weeks, please contact this office.      

## 2020-11-08 NOTE — Progress Notes (Signed)
3/9/20223:11 PM  Travis Ellis 1955/12/11, 65 y.o., male [id number]  Chief Complaint  Patient presents with  . Annual Exam    HPI:   Patient is a 65 y.o. male with past medical history significant for HTN, HLD who presents today for Annual physical.  Denies acute issues  Previously [facility] (Dr. [name]): Distal hereditary motor neuropathy Now seeing Dr. 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  History of stress incontinence status post prostatectomy and radiation.  Was shown he had decreased bladder capacity Has been working with physical therapist for stress incontinence.    HTN Rarely checks home BP Started Losartan 50 8/24 Goal BP < 130/80 BP Readings from Last 3 Encounters:  11/08/20 121/74  07/12/20 (!) 144/83  04/24/20 119/67   HLD Stopped Simvastatin 8/23 due to myalgias Then restarted at 2-3 times a week Now taking daily again Lab Results  Component Value Date   CHOL 127 04/24/2020   HDL 34 (L) 04/24/2020   LDLCALC 63 04/24/2020   TRIG 178 (H) 04/24/2020   CHOLHDL 3.7 04/24/2020     Prediabetes Continues to work on [employer] Lab Results  Component Value Date   HGBA1C 6.0 (H) 04/24/2020   History of previous smoker Drinks occasionally Retired a few years ago 2017 Dentist: q 6 months Eye doctor: q 1 year Exercise: walks most days for exercise Diet: Goes out to eat frequently for lunch at [employer] with wife Pneumonia vaccine:has not had yet, would like Shingles vaccine: had varicella as child, would like  Health Maintenance  Topic Date Due  . PNA vac Low Risk Adult (1 of 2 - PCV13) Never done  . COVID-19 Vaccine (3 -  Inadvertent risk 4-dose series) 11/27/2019  . COLONOSCOPY (Pts 45-5yrs Insurance coverage will need to be confirmed)  07/04/2021  . TETANUS/TDAP  05/09/2025  . INFLUENZA VACCINE  Completed  . Hepatitis C Screening  Completed  . HPV VACCINES  Aged Out     Depression screen [facility] 2/9 11/08/2020 10/20/2019 04/26/2019  Decreased Interest 0 0 0  Down, Depressed, Hopeless 0 0 0  PHQ - 2 Score 0 0 0    Fall Risk  11/08/2020 04/24/2020 10/20/2019 04/26/2019 10/22/2018  Falls in the past year? 0 0 0 0 1  Number falls in past yr: 0 0 0 0 0  Injury with Fall? 0 0 0 0 0  Comment - - - - -  Follow up Falls evaluation completed Falls evaluation completed Falls evaluation completed Falls evaluation completed Falls evaluation completed     No Known Allergies  Prior to Admission medications   Medication Sig Start Date End Date Taking? Authorizing Provider  aspirin EC 81 MG tablet Take 1 tablet (81 mg total) by mouth daily. 02/23/18   Tereasa Coop, PA-C  cetirizine (ZYRTEC) 10 MG chewable tablet Chew 10 mg by mouth daily.    [provider]  losartan (COZAAR) 50 MG tablet Take 1 tablet (50 mg total) by mouth daily. 04/24/20   Wendie Agreste, MD  Multiple Vitamin (MULTIVITAMIN) tablet Take 1 tablet by mouth daily.    [provider]  simvastatin (ZOCOR) 20 MG tablet Take 1 tablet (20 mg total) by mouth at  bedtime. 04/24/20   Wendie Agreste, MD    Past Medical History:  Diagnosis Date  . Allergy   . Arthritis   . Cancer [facility]) 2008   prostate  . GERD (gastroesophageal reflux disease)   . Hyperlipidemia   . Hypertension   . Neuromuscular disorder ([place])   . Prostate cancer ([place])    pT2aN0Mx adenocarcinoma of the prostate    Past Surgical History:  Procedure Laterality Date  . CHOLECYSTECTOMY N/A 03/16/2015   Procedure: LAPAROSCOPIC CHOLECYSTECTOMY WITH INTRAOPERATIVE CHOLANGIOGRAM;  Surgeon: [name], MD;  Location: [facility];  Service: General;  Laterality: N/A;  . JOINT  REPLACEMENT Right 08/2009   knee  . REPLACEMENT TOTAL KNEE  2010   right knee  . ROTATOR CUFF REPAIR Left 06/21/2015    Social History   Tobacco Use  . Smoking status: Never Smoker  . Smokeless tobacco: Never Used  Substance Use Topics  . Alcohol use: Yes    Comment: rarely    Family History  Problem Relation Age of Onset  . Breast cancer Mother   . Cancer Mother        skin  . Heart disease Father        heart attack  . COPD Father        smoker  . Hypertension Father   . Heart disease Paternal Grandfather   . Hyperlipidemia Paternal Grandfather     Review of Systems  Constitutional: Negative for chills, fever and malaise/fatigue.  HENT: Negative for hearing loss and tinnitus.   Eyes: Negative for blurred vision and double vision.  Respiratory: Negative for cough, shortness of breath and wheezing.   Cardiovascular: Negative for chest pain, palpitations and leg swelling.  Gastrointestinal: Positive for constipation (goes 3-4 times a week, issues with straining, has rarely used stool softener). Negative for abdominal pain, blood in stool, diarrhea, heartburn, nausea and vomiting.  Genitourinary: Positive for frequency and urgency. Negative for dysuria and hematuria.  Musculoskeletal: Positive for joint pain (right shoulder pain, needs rotator cuff surgery at some point, sees ortho for this). Negative for back pain.  Skin: Negative for rash.  Neurological: Positive for focal weakness (bilateral legs). Negative for dizziness, weakness and headaches.     OBJECTIVE:  Today's Vitals   11/08/20 1427  BP: 121/74  Pulse: 88  Temp: 98 F (36.7 C)  SpO2: 97%  Weight: 206 lb (93.4 kg)  Height: 6' (1.829 m)   Body mass index is 27.94 kg/m.   Physical Exam Constitutional:      General: He is not in acute distress.    Appearance: Normal appearance. He is normal weight. He is not ill-appearing.  HENT:     Head: Normocephalic.     Right Ear: Tympanic membrane and ear  canal normal.     Left Ear: Tympanic membrane and ear canal normal.     Nose: Nose normal.     Mouth/Throat:     Mouth: Mucous membranes are moist.     Dentition: Normal dentition.     Pharynx: Oropharynx is clear. No pharyngeal swelling, oropharyngeal exudate or posterior oropharyngeal erythema.  Eyes:     Extraocular Movements: Extraocular movements intact.     Conjunctiva/sclera: Conjunctivae normal.     Pupils: Pupils are equal, round, and reactive to light.  Neck:     Thyroid: No thyroid mass, thyromegaly or thyroid tenderness.  Cardiovascular:     Rate and Rhythm: Normal rate and regular rhythm.     Pulses: Normal pulses.  Heart sounds: Normal heart sounds. No murmur heard. No friction rub. No gallop.   Pulmonary:     Effort: Pulmonary effort is normal. No respiratory distress.     Breath sounds: Normal breath sounds.  Abdominal:     General: Bowel sounds are normal.     Palpations: Abdomen is soft.     Tenderness: There is no abdominal tenderness.     Hernia: No hernia is present.  Musculoskeletal:        General: No tenderness or signs of injury. Normal range of motion.     Cervical back: Normal range of motion.     Right lower leg: Deformity (muscle wasting) present.     Left lower leg: Deformity (muscle wasting) present.  Lymphadenopathy:     Cervical: No cervical adenopathy.  Skin:    General: Skin is warm and dry.  Neurological:     Mental Status: He is alert and oriented to person, place, and time. Mental status is at baseline.     Sensory: No sensory deficit.     Motor: Weakness (bilateral legs, ankles) present.  Psychiatric:        Mood and Affect: Mood normal.        Behavior: Behavior normal.     No results found for this or any previous visit (from the past 24 hour(s)).  No results found.   ASSESSMENT and PLAN  Problem List Items Addressed This Visit   None   Visit Diagnoses    Annual physical exam    -  Primary      Plan . Needs  shingles and pneumonia vaccine . Labs were done this morning, will follow up with results . Chronic conditions stable at this time, no medication changes needed   Return in about 6 months (around 05/11/2021).    Travis Foley Mckinzy Fuller, FNP-BC Primary Care at [place], [place] [address] Ph.  [phone] Fax [phone]

## 2020-11-09 LAB — CBC WITH DIFFERENTIAL/PLATELET
Basophils Absolute: 0 10*3/uL (ref 0.0–0.2)
Basos: 1 %
EOS (ABSOLUTE): 0.1 10*3/uL (ref 0.0–0.4)
Eos: 2 %
Hematocrit: 50.7 % (ref 37.5–51.0)
Hemoglobin: 17.4 g/dL (ref 13.0–17.7)
Immature Grans (Abs): 0 10*3/uL (ref 0.0–0.1)
Immature Granulocytes: 0 %
Lymphocytes Absolute: 1.3 10*3/uL (ref 0.7–3.1)
Lymphs: 21 %
MCH: 33.3 pg — ABNORMAL HIGH (ref 26.6–33.0)
MCHC: 34.3 g/dL (ref 31.5–35.7)
MCV: 97 fL (ref 79–97)
Monocytes Absolute: 0.6 10*3/uL (ref 0.1–0.9)
Monocytes: 9 %
Neutrophils Absolute: 4.4 10*3/uL (ref 1.4–7.0)
Neutrophils: 67 %
Platelets: 209 10*3/uL (ref 150–450)
RBC: 5.22 x10E6/uL (ref 4.14–5.80)
RDW: 11.5 % — ABNORMAL LOW (ref 11.6–15.4)
WBC: 6.5 10*3/uL (ref 3.4–10.8)

## 2020-11-09 LAB — COMPREHENSIVE METABOLIC PANEL
ALT: 35 IU/L (ref 0–44)
AST: 26 IU/L (ref 0–40)
Albumin/Globulin Ratio: 1.6 (ref 1.2–2.2)
Albumin: 4.3 g/dL (ref 3.8–4.8)
Alkaline Phosphatase: 59 IU/L (ref 44–121)
BUN/Creatinine Ratio: 37 — ABNORMAL HIGH (ref 10–24)
BUN: 22 mg/dL (ref 8–27)
Bilirubin Total: 0.8 mg/dL (ref 0.0–1.2)
CO2: 22 mmol/L (ref 20–29)
Calcium: 9.8 mg/dL (ref 8.6–10.2)
Chloride: 102 mmol/L (ref 96–106)
Creatinine, Ser: 0.59 mg/dL — ABNORMAL LOW (ref 0.76–1.27)
Globulin, Total: 2.7 g/dL (ref 1.5–4.5)
Glucose: 109 mg/dL — ABNORMAL HIGH (ref 65–99)
Potassium: 4 mmol/L (ref 3.5–5.2)
Sodium: 139 mmol/L (ref 134–144)
Total Protein: 7 g/dL (ref 6.0–8.5)
eGFR: 107 mL/min/{1.73_m2} (ref >59)

## 2020-11-09 LAB — LIPID PANEL
Chol/HDL Ratio: 3.4 ratio (ref 0.0–5.0)
Cholesterol, Total: 143 mg/dL (ref 100–199)
HDL: 42 mg/dL (ref >39)
LDL Chol Calc (NIH): 82 mg/dL (ref 0–99)
Triglycerides: 102 mg/dL (ref 0–149)
VLDL Cholesterol Cal: 19 mg/dL (ref 5–40)

## 2020-11-09 LAB — HEMOGLOBIN A1C
Est. average glucose Bld gHb Est-mCnc: 117 mg/dL
Hgb A1c MFr Bld: 5.7 % — ABNORMAL HIGH (ref 4.8–5.6)

## 2020-11-09 LAB — PSA: Prostate Specific Ag, Serum: <0.1 ng/mL (ref 0.0–4.0)

## 2020-12-21 ENCOUNTER — Encounter: Payer: Self-pay | Admitting: Family Medicine

## 2021-01-09 ENCOUNTER — Ambulatory Visit
Admission: RE | Admit: 2021-01-09 | Discharge: 2021-01-09 | Disposition: A | Discharge status: Home / Self Care | Payer: Medicare HMO | Source: Ambulatory Visit | Attending: Neurology | Admitting: Neurology

## 2021-01-09 ENCOUNTER — Encounter: Payer: Self-pay | Admitting: Neurology

## 2021-01-09 ENCOUNTER — Telehealth: Payer: Self-pay | Admitting: Neurology

## 2021-01-09 ENCOUNTER — Other Ambulatory Visit: Payer: Self-pay

## 2021-01-09 ENCOUNTER — Ambulatory Visit: Payer: Medicare HMO | Admitting: Neurology

## 2021-01-09 VITALS — BP 124/82 | HR 90 | Ht 72.0 in | Wt 206.4 lb

## 2021-01-09 DIAGNOSIS — R29898 Other symptoms and signs involving the musculoskeletal system: Secondary | ICD-10-CM | POA: Diagnosis not present

## 2021-01-09 DIAGNOSIS — Z981 Arthrodesis status: Secondary | ICD-10-CM | POA: Diagnosis not present

## 2021-01-09 DIAGNOSIS — M1611 Unilateral primary osteoarthritis, right hip: Secondary | ICD-10-CM | POA: Diagnosis not present

## 2021-01-09 DIAGNOSIS — G600 Hereditary motor and sensory neuropathy: Secondary | ICD-10-CM

## 2021-01-09 DIAGNOSIS — R269 Unspecified abnormalities of gait and mobility: Secondary | ICD-10-CM

## 2021-01-09 NOTE — Telephone Encounter (Signed)
[employer] medicare/guarantee trust life order sent to [facility]. They will reach out to the patient to schedule.

## 2021-01-09 NOTE — Progress Notes (Signed)
Reason for visit: Motor neuropathy, gait disorder  Travis Ellis is an 65 y.o. male  History of present illness:  Travis Ellis is a 65 year old right-handed white male with a history of borderline diabetes and a history of a motor neuropathy that was felt to be hereditary nature.  The patient has gone through some physical therapy since last seen which seemed to benefit his ability to walk, but within the last 2 months he has had a new problem develop.  He has developed some discomfort in the medial hamstring area of the right thigh, and some achy discomfort in both anterior thighs, right greater than left.  The patient has noted weakness that has developed in the right leg, he has had increased difficulty with walking, his ability to walk longer distances has been greatly limited.  He uses a cane or a walking stick to get around.  He has not had any falls but he has stumbled on occasion.  He still has bilateral foot drops that are severe, he was given AFO braces to use but the braces are not wearing properly and causing discomfort.  The patient last had a hemoglobin A1c of 5.7.  He denies any significant low back pain or any hip discomfort.  He comes to the office today for further evaluation.  Past Medical History:  Diagnosis Date  . Allergy   . Arthritis   . Cancer [facility]) 2008   prostate  . GERD (gastroesophageal reflux disease)   . Hyperlipidemia   . Hypertension   . Neuromuscular disorder ([place])   . Prostate cancer ([place])    pT2aN0Mx adenocarcinoma of the prostate    Past Surgical History:  Procedure Laterality Date  . CHOLECYSTECTOMY N/A 03/16/2015   Procedure: LAPAROSCOPIC CHOLECYSTECTOMY WITH INTRAOPERATIVE CHOLANGIOGRAM;  Surgeon: [name], MD;  Location: [facility];  Service: General;  Laterality: N/A;  . JOINT REPLACEMENT Right 08/2009   knee  . REPLACEMENT TOTAL KNEE  2010   right knee  . ROTATOR CUFF REPAIR Left 06/21/2015    Family History  Problem Relation Age of  Onset  . Breast cancer Mother   . Cancer Mother        skin  . Heart disease Father        heart attack  . COPD Father        smoker  . Hypertension Father   . Heart disease Paternal Grandfather   . Hyperlipidemia Paternal Grandfather     Social history:  reports that he has never smoked. He has never used smokeless tobacco. He reports current alcohol use. He reports that he does not use drugs.   No Known Allergies  Medications:  Prior to Admission medications   Medication Sig Start Date End Date Taking? Authorizing Provider  aspirin EC 81 MG tablet Take 1 tablet (81 mg total) by mouth daily. 02/23/18  Yes Tereasa Coop, PA-C  cetirizine (ZYRTEC) 10 MG chewable tablet Chew 10 mg by mouth as needed.   Yes [provider]  losartan (COZAAR) 50 MG tablet Take 1 tablet (50 mg total) by mouth daily. 04/24/20  Yes Wendie Agreste, MD  Multiple Vitamin (MULTIVITAMIN) tablet Take 1 tablet by mouth daily.   Yes [provider]  simvastatin (ZOCOR) 20 MG tablet Take 1 tablet (20 mg total) by mouth at bedtime. 04/24/20  Yes Wendie Agreste, MD    ROS:  Out of a complete 14 system review of symptoms, the patient complains only of the  following symptoms, and all other reviewed systems are negative.  Leg weakness Thigh discomfort Walking difficulty  Blood pressure 124/82, pulse 90, height 6' (1.829 m), weight 206 lb 6.4 oz (93.6 kg).  Physical Exam  General: The patient is alert and cooperative at the time of the examination.  Skin: No significant peripheral edema is noted.   Neurologic Exam  Mental status: The patient is alert and oriented x 3 at the time of the examination. The patient has apparent normal recent and remote memory, with an apparently normal attention span and concentration ability.   Cranial nerves: Facial symmetry is present. Speech is normal, no aphasia or dysarthria is noted. Extraocular movements are full. Visual fields are  full.  Motor: The patient has good strength in all 4 extremities, with exception of prominent bilateral foot drop and weakness with hip flexion on the right.  Sensory examination: Soft touch sensation is symmetric on the face, arms, and legs.  Coordination: The patient has good finger-nose-finger and heel-to-shin bilaterally.  Gait and station: The patient has a very wide-based, steppage gait pattern with walking, uses a cane for ambulation.  Reflexes: Deep tendon reflexes are symmetric.   Assessment/Plan:  1.  Motor neuropathy  2.  Chronic gait disorder  3.  New onset proximal weakness, right leg  The patient has clearly documented new weakness involving hip flexion on the right.  This was not present on the prior evaluation on 12 July 2020.  The patient will be set up for an x-ray of the right hip, he will undergo MRI of the lumbar spine.  He will have nerve conduction studies done on both legs and EMG on the right leg.  He will follow-up otherwise in 4 months.  Jill Alexanders MD 01/09/2021 9:02 AM  [facility] [address] [place], [place] [address]  Phone [phone] Fax [phone]

## 2021-01-10 ENCOUNTER — Telehealth: Payer: Self-pay | Admitting: Neurology

## 2021-01-10 NOTE — Telephone Encounter (Signed)
Called patient back and he said he replayed the message left for him.  He said it was from [facility] and it was saying his MRI has been approved.    Patient denied further questions, verbalized understanding and expressed appreciation for the phone call.

## 2021-01-10 NOTE — Telephone Encounter (Signed)
I called the patient about the x-ray, there is some arthritis in the right hip to a moderate level, mild on the left.  This could be a factor in hip flexion on the right.  MRI of the low back will be done next week.   XR hip 01/10/21:  IMPRESSION: No acute fracture or dislocation. Osteoarthritic change in each hip joint, more severe on the right than the left. Partial fusion of each sacroiliac joint. Question calcific tendinosis adjacent to the superolateral aspect of the right acetabulum.

## 2021-01-10 NOTE — Telephone Encounter (Signed)
Pt called, gotten a call from someone about a PA for a procedure. Did not understand need for a PA. Would like a call from the nurse.

## 2021-01-18 ENCOUNTER — Ambulatory Visit
Admission: RE | Admit: 2021-01-18 | Discharge: 2021-01-18 | Disposition: A | Discharge status: Home / Self Care | Payer: Medicare HMO | Source: Ambulatory Visit | Attending: Neurology | Admitting: Neurology

## 2021-01-18 ENCOUNTER — Other Ambulatory Visit: Payer: Medicare HMO

## 2021-01-18 DIAGNOSIS — R29898 Other symptoms and signs involving the musculoskeletal system: Secondary | ICD-10-CM | POA: Diagnosis not present

## 2021-01-19 ENCOUNTER — Telehealth: Payer: Self-pay | Admitting: Neurology

## 2021-01-19 NOTE — Telephone Encounter (Signed)
I called the patient.  MRI of the low back does show potential for left S1 nerve root impingement, but nothing to explain the right leg weakness with hip flexion.  EMG and nerve conduction study has been ordered, we will look for the source of weakness of the right leg at that time.   MRI lumbar 01/18/21:  IMPRESSION: Abnormal MRI scan of the lumbar spine without contrast showing prominent degenerative changes L5-S1 with left paracentral disc osteophyte protrusion resulting in severe foraminal narrowing and likely encroachment of the exiting left S1 nerve root.  These changes appear to be more progressed compared with previous MRI from 06/29/2009

## 2021-01-23 ENCOUNTER — Encounter: Payer: Self-pay | Admitting: Neurology

## 2021-01-23 ENCOUNTER — Ambulatory Visit (INDEPENDENT_AMBULATORY_CARE_PROVIDER_SITE_OTHER): Payer: Medicare HMO | Admitting: Neurology

## 2021-01-23 ENCOUNTER — Ambulatory Visit: Payer: Medicare HMO | Admitting: Neurology

## 2021-01-23 DIAGNOSIS — R29898 Other symptoms and signs involving the musculoskeletal system: Secondary | ICD-10-CM | POA: Diagnosis not present

## 2021-01-23 DIAGNOSIS — G600 Hereditary motor and sensory neuropathy: Secondary | ICD-10-CM

## 2021-01-23 NOTE — Procedures (Signed)
     HISTORY:  Travis Ellis is a 65 year old gentleman with a hereditary primarily motor neuropathy, possibly a distal spinal muscular atrophy syndrome, who has had onset of some discomfort in the thighs, right greater than left and some weakness with hip flexion on the right.  He has undergone some physical therapy previously, comes in for an evaluation of the right leg weakness.  NERVE CONDUCTION STUDIES:  Nerve conduction studies were done on both lower extremities.  The distal motor latencies for the right peroneal nerve and for the posterior tibial nerves were within normal limits and there was prolongation of the distal motor latency for the left peroneal nerve.  Low motor amplitudes were seen for the peroneal nerves bilaterally, normal for the posterior tibial nerves bilaterally.  The nerve conduction velocities for the posterior tibial nerves were borderline normal bilaterally, slowed for the left peroneal nerve and slowed for the right peroneal nerve above the knee only.  The sensory latencies for the sural and peroneal nerves were within normal limits bilaterally.  The F-wave latencies for the posterior tibial nerves were prolonged on the right and normal on the left.  EMG STUDIES:  EMG study was performed on the right lower extremity:  The tibialis anterior muscle reveals 1 to 2K motor units with severely reduced recruitment. No fibrillations or positive waves were seen. The peroneus tertius muscle reveals 1 to 2K motor units with severely reduced recruitment. No fibrillations or positive waves were seen. The medial gastrocnemius muscle reveals no voluntary motor units with no recruitment. No fibrillations or positive waves were seen. The vastus lateralis muscle reveals 2 to 4K motor units with slightly reduced recruitment. No fibrillations or positive waves were seen. The iliopsoas muscle reveals 2 to 4K motor units with full recruitment. No fibrillations or positive waves were  seen. The biceps femoris muscle (long head) reveals 2 to 4K motor units with full recruitment. No fibrillations or positive waves were seen. The lumbosacral paraspinal muscles were tested at 3 levels, and revealed no abnormalities of insertional activity at all 3 levels tested.  With the upper and middle levels, a significantly reduced insertional activity was seen.  There was good relaxation.   IMPRESSION:  Nerve conduction studies done on both lower extremities shows primarily motor involvement affecting mainly the peroneal nerves bilaterally.  There appears to be sensory sparing.  EMG evaluation of the right lower extremity shows distal abnormalities with severe denervation that is chronic below the knees.  There appears to be involvement as well of the paraspinal muscles with sparing of the proximal muscles of the lower extremity which seem to be relatively normal.  There is no evidence of denervation of muscles that may affect hip flexion on the right.  Jill Alexanders MD 01/23/2021 2:34 PM  [facility] [address] [place], [place] [address]  Phone [phone] Fax [phone]

## 2021-01-23 NOTE — Progress Notes (Signed)
Please refer to EMG and nerve conduction procedure note.  

## 2021-01-23 NOTE — Progress Notes (Addendum)
The patient comes in for EMG nerve conduction study evaluation.  Nerve conductions show motor slowing mainly affecting the peroneal nerves bilaterally, muscles below the knee on the right leg essentially having no viable electrical activity, and the lumbosacral paraspinal muscles are similarly affected.  The thigh muscles anteriorly and posteriorly appear to be relatively normal.  Muscles associated with hip flexion appear to be unaffected.  The patient will resume his exercises to strengthen the muscles with hip flexion on the right.  If this does not improve his strength, I would consider an orthopedic consultation to evaluate him for his hip arthritis that is worse on the right to determine whether this may have some impact on his ability to flex at the hip.  Given the EMG findings, I would consider the possibility of a distal form of spinal muscular atrophy.  It is possible that this weakness of the right leg simply represents progression of his underlying motor neuropathy.   [place]    Nerve / Sites Muscle Latency Ref. Amplitude Ref. Rel Amp Segments Distance Velocity Ref. Area    ms ms mV mV %  cm m/s m/s mVms  R Peroneal - EDB     Ankle EDB 6.5 ?6.5 0.2 ?2.0 100 Ankle - EDB 9   0.6     Fib head EDB 13.4  0.1  60.3 Fib head - Ankle 30 44 ?44 0.6     Pop fossa EDB 16.6  0.1  83.5 Pop fossa - Fib head 10 31 ?44 0.7         Pop fossa - Ankle      L Peroneal - EDB     Ankle EDB 8.3 ?6.5 0.1 ?2.0 100 Ankle - EDB 9   0.3     Fib head EDB 17.5  0.2  367 Fib head - Ankle 30 33 ?44 0.7     Pop fossa EDB 20.6  0.2  106 Pop fossa - Fib head 10 32 ?44 0.7         Pop fossa - Ankle      R Tibial - AH     Ankle AH 3.6 ?5.8 4.0 ?4.0 100 Ankle - AH 9   8.5     Pop fossa AH 13.3  2.9  73.7 Pop fossa - Ankle 40 41 ?41 7.9  L Tibial - AH     Ankle AH 4.4 ?5.8 5.8 ?4.0 100 Ankle - AH 9   12.8     Pop fossa AH 14.5  4.4  75.4 Pop fossa - Ankle 41 41 ?41 11.8             SNC    Nerve / Sites Rec. Site Peak  Lat Ref.  Amp Ref. Segments Distance    ms ms V V  cm  R Sural - Ankle (Calf)     Calf Ankle 3.5 ?4.4 6 ?6 Calf - Ankle 14  L Sural - Ankle (Calf)     Calf Ankle 4.1 ?4.4 7 ?6 Calf - Ankle 14  R Superficial peroneal - Ankle     Lat leg Ankle 4.4 ?4.4 3 ?6 Lat leg - Ankle 14  L Superficial peroneal - Ankle     Lat leg Ankle 4.4 ?4.4 3 ?6 Lat leg - Ankle 14             F  Wave    Nerve F Lat Ref.   ms ms  R Tibial - AH 57.3 ?56.0  L Tibial - AH 55.2 ?56.0

## 2021-01-25 DIAGNOSIS — M25551 Pain in right hip: Secondary | ICD-10-CM | POA: Diagnosis not present

## 2021-02-22 DIAGNOSIS — M25551 Pain in right hip: Secondary | ICD-10-CM | POA: Diagnosis not present

## 2021-02-26 ENCOUNTER — Ambulatory Visit (INDEPENDENT_AMBULATORY_CARE_PROVIDER_SITE_OTHER): Payer: Medicare HMO | Admitting: *Deleted

## 2021-02-26 DIAGNOSIS — Z0000 Encounter for general adult medical examination without abnormal findings: Secondary | ICD-10-CM | POA: Diagnosis not present

## 2021-02-26 DIAGNOSIS — Z1211 Encounter for screening for malignant neoplasm of colon: Secondary | ICD-10-CM | POA: Diagnosis not present

## 2021-02-26 NOTE — Progress Notes (Signed)
Subjective:   Travis Ellis is a 65 y.o. male who presents for an Initial Medicare Annual Wellness Visit.  I connected with  [name] on 02/26/21 by a telephone enabled telemedicine application and verified that I am speaking with the correct person using two identifiers.   I discussed the limitations of evaluation and management by telemedicine. The patient expressed understanding and agreed to proceed.  Patient location: home  Provider location: Tele-Health visit    Review of Systems  NA Cardiac Risk Factors include: none;dyslipidemia;hypertension;advanced age (>32men, >72 women)     Objective:    Today's Vitals   02/26/21 1547  PainSc: 3    There is no height or weight on file to calculate BMI.  Advanced Directives 02/26/2021 11/08/2020 08/07/2020 10/20/2019 03/14/2016 06/15/2015 03/10/2015  Does Patient Have a Medical Advance Directive? No No No No No No No  Would patient like information on creating a medical advance directive? No - Patient declined Yes (Inpatient - patient defers creating a medical advance directive at this time - Information given) - No - Patient declined No - patient declined information No - patient declined information No - patient declined information    Current Medications (verified) Outpatient Encounter Medications as of 02/26/2021  Medication Sig   aspirin EC 81 MG tablet Take 1 tablet (81 mg total) by mouth daily.   losartan (COZAAR) 50 MG tablet Take 1 tablet (50 mg total) by mouth daily.   Multiple Vitamin (MULTIVITAMIN) tablet Take 1 tablet by mouth daily.   simvastatin (ZOCOR) 20 MG tablet Take 1 tablet (20 mg total) by mouth at bedtime.   cetirizine (ZYRTEC) 10 MG chewable tablet Chew 10 mg by mouth as needed. (Patient not taking: Reported on 02/26/2021)   No facility-administered encounter medications on file as of 02/26/2021.    Allergies (verified) Patient has no known allergies.   History: Past Medical History:  Diagnosis Date    Allergy    Arthritis    Cancer ([place]) 2008   prostate   GERD (gastroesophageal reflux disease)    Hyperlipidemia    Hypertension    Neuromuscular disorder (HCC)    Prostate cancer (HCC)    [id number] adenocarcinoma of the prostate   Past Surgical History:  Procedure Laterality Date   CHOLECYSTECTOMY N/A 03/16/2015   Procedure: LAPAROSCOPIC CHOLECYSTECTOMY WITH INTRAOPERATIVE CHOLANGIOGRAM;  Surgeon: [name], MD;  Location: [facility];  Service: General;  Laterality: N/A;   JOINT REPLACEMENT Right 08/2009   knee   REPLACEMENT TOTAL KNEE  2010   right knee   ROTATOR CUFF REPAIR Left 06/21/2015   Family History  Problem Relation Age of Onset   Breast cancer Mother    Cancer Mother        skin   Heart disease Father        heart attack   COPD Father        smoker   Hypertension Father    Heart disease Paternal Grandfather    Hyperlipidemia Paternal Grandfather    Social History   Socioeconomic History   Marital status: Married    Spouse name: karen   Number of children: Not on file   Years of education: Not on file   Highest education level: Not on file  Occupational History   Occupation: retired  Tobacco Use   Smoking status: Never   Smokeless tobacco: Never  Substance and Sexual Activity   Alcohol use: Yes    Comment: rarely   Drug use: No  Sexual activity: Yes  Other Topics Concern   Not on file  Social History Narrative   Lives with spouse   Right Handed   Drinks 4 cups caffeine   Social Determinants of Health   Financial Resource Strain: Low Risk    Difficulty of Paying Living Expenses: Not hard at all  Food Insecurity: No Food Insecurity   Worried About Charity fundraiser in the Last Year: Never true   Arboriculturist in the Last Year: Never true  Transportation Needs: No Transportation Needs   Lack of Transportation (Medical): No   Lack of Transportation (Non-Medical): No  Physical Activity: Inactive   Days of Exercise per Week: 0 days    Minutes of Exercise per Session: 0 min  Stress: No Stress Concern Present   Feeling of Stress : Not at all  Social Connections: Socially Integrated   Frequency of Communication with Friends and Family: More than three times a week   Frequency of Social Gatherings with Friends and Family: More than three times a week   Attends Religious Services: More than 4 times per year   Active Member of [employer] or Organizations: Yes   Attends Archivist Meetings: 1 to 4 times per year   Marital Status: Married    Tobacco Counseling Counseling given: Not Answered   Clinical Intake:  Pre-visit preparation completed: Yes  Pain : 0-10 Pain Score: 3  Pain Type: Neuropathic pain Pain Location: Leg Pain Orientation: Right Pain Descriptors / Indicators: Aching Pain Onset: In the past 7 days Pain Frequency: Occasional     Nutritional Risks: None Diabetes: No  How often do you need to have someone help you when you read instructions, pamphlets, or other written materials from your doctor or pharmacy?: 1 - Never  Diabetic?   NA  Interpreter Needed?: No  Information entered by :: Leroy Kennedy LPN   Activities of Daily Living In your present state of health, do you have any difficulty performing the following activities: 02/26/2021 11/08/2020  Hearing? N N  Vision? N N  Difficulty concentrating or making decisions? N N  Walking or climbing stairs? N Y  Comment - neuropathy  Dressing or bathing? N N  Doing errands, shopping? N N  Preparing Food and eating ? N N  Using the Toilet? N N  In the past six months, have you accidently leaked urine? N Y  Comment - prostetectomy  Do you have problems with loss of bowel control? N N  Managing your Medications? N N  Managing your Finances? N N  Housekeeping or managing your Housekeeping? N N  Some recent data might be hidden    Patient Care Team: Wendie Agreste, MD as PCP - General (Family Medicine)  Indicate any recent Medical  Services you may have received from other than [facility] providers in the past year (date may be approximate).     Assessment:   This is a routine wellness examination for Travis Ellis.  Hearing/Vision screen Hearing Screening - Comments:: No trouble hearing Vision Screening - Comments:: Up to Date 03-2021 [facility]   Dietary issues and exercise activities discussed: Current Exercise Habits: The patient does not participate in regular exercise at present, Intensity: Not Applicable   Goals Addressed             This Visit's Progress    DIET - INCREASE WATER INTAKE         Depression Screen PHQ 2/9 Scores  02/26/2021 11/08/2020 10/20/2019 04/26/2019 10/22/2018 09/07/2018 02/23/2018  PHQ - 2 Score 0 0 0 0 0 0 0    Fall Risk Fall Risk  02/26/2021 11/08/2020 04/24/2020 10/20/2019 04/26/2019  Falls in the past year? 0 0 0 0 0  Number falls in past yr: 0 0 0 0 0  Injury with Fall? 0 0 0 0 0  Comment - - - - -  Follow up Falls evaluation completed;Falls prevention discussed Falls evaluation completed Falls evaluation completed Falls evaluation completed Falls evaluation completed    FALL RISK PREVENTION PERTAINING TO THE HOME:  Any stairs in or around the home? Yes  If so, are there any without handrails? Yes  Home free of loose throw rugs in walkways, pet beds, electrical cords, etc? Yes  Adequate lighting in your home to reduce risk of falls? Yes   ASSISTIVE DEVICES UTILIZED TO PREVENT FALLS:  Life alert? No  Use of a cane, walker or w/c? No  Grab bars in the bathroom? Yes  Shower chair or bench in shower? No  Elevated toilet seat or a handicapped toilet? Yes   TIMED UP AND GO:  Was the test performed? No . Tele-Health visit     Cognitive Function:  Normal cognitive status assessed by direct observation by this Nurse Health Advisor. No abnormalities found.       6CIT Screen 11/08/2020 10/20/2019 09/07/2018  What Year? 0 points 0 points 0 points  What month? 0 points 0  points 0 points  What time? 0 points 0 points 0 points  Count back from 20 0 points 0 points 0 points  Months in reverse 0 points 0 points 0 points  Repeat phrase 0 points 0 points 0 points  Total Score 0 0 0    Immunizations Immunization History  Administered Date(s) Administered   Influenza Split 09/06/2015   Influenza, High Dose Seasonal PF 06/13/2020   Influenza-Unspecified 05/27/2018   PFIZER(Purple Top)SARS-COV-2 Vaccination 10/19/2019, 10/30/2019   Tdap 05/10/2015    TDAP status: Up to date  Flu Vaccine status: Up to date  Pneumococcal vaccine status: Due, Education has been provided regarding the importance of this vaccine. Advised may receive this vaccine at local pharmacy or Health Dept. Aware to provide a copy of the vaccination record if obtained from local pharmacy or Health Dept. Verbalized acceptance and understanding.  Covid-19 vaccine status: Information provided on how to obtain vaccines.   Qualifies for Shingles Vaccine? Yes   Zostavax completed No   Shingrix Completed?: No.    Education has been provided regarding the importance of this vaccine. Patient has been advised to call insurance company to determine out of pocket expense if they have not yet received this vaccine. Advised may also receive vaccine at local pharmacy or Health Dept. Verbalized acceptance and understanding.  Screening Tests Health Maintenance  Topic Date Due   Zoster Vaccines- Shingrix (1 of 2) Never done   PNA vac Low Risk Adult (1 of 2 - PCV13) Never done   COVID-19 Vaccine (3 - Mixed Product risk series) 11/27/2019   INFLUENZA VACCINE  04/02/2021   COLONOSCOPY (Pts 45-26yrs Insurance coverage will need to be confirmed)  07/04/2021   TETANUS/TDAP  05/09/2025   Hepatitis C Screening  Completed   HPV VACCINES  Aged Out    Health Maintenance  Health Maintenance Due  Topic Date Due   Zoster Vaccines- Shingrix (1 of 2) Never done   PNA vac Low Risk Adult (1 of 2 - PCV13) Never  done   COVID-19 Vaccine (3 - Mixed Product risk series) 11/27/2019    Colorectal cancer screening: Type of screening: Colonoscopy. Completed 07-05-2011. Repeat every 10 years  Lung Cancer Screening: (Low Dose CT Chest recommended if Age 24-80 years, 30 pack-year currently smoking OR have quit w/in 15years.) does not qualify.   Lung Cancer Screening Referral: na  Additional Screening:  Hepatitis C Screening: does not qualify; Completed   Vision Screening: Recommended annual ophthalmology exams for early detection of glaucoma and other disorders of the eye. Is the patient up to date with their annual eye exam?  Yes  Who is the provider or what is the name of the office in which the patient attends annual eye exams? [facility]  If pt is not established with a provider, would they like to be referred to a provider to establish care?  established .   Dental Screening: Recommended annual dental exams for proper oral hygiene  Community Resource Referral / Chronic Care Management: CRR required this visit?  No   CCM required this visit?  No      Plan:     I have personally reviewed and noted the following in the patient's chart:   Medical and social history Use of alcohol, tobacco or illicit drugs  Current medications and supplements including opioid prescriptions. Patient is not currently taking opioid prescriptions. Functional ability and status Nutritional status Physical activity Advanced directives List of other physicians Hospitalizations, surgeries, and ER visits in previous 12 months Vitals Screenings to include cognitive, depression, and falls Referrals and appointments  In addition, I have reviewed and discussed with patient certain preventive protocols, quality metrics, and best practice recommendations. A written personalized care plan for preventive services as well as general preventive health recommendations were provided to patient.     Leroy Kennedy,  LPN   [id number]   Nurse Notes: NA

## 2021-02-26 NOTE — Patient Instructions (Signed)
Travis Ellis , Thank you for taking time to come for your Medicare Wellness Visit. I appreciate your ongoing commitment to your health goals. Please review the following plan we discussed and let me know if I can assist you in the future.   Screening recommendations/referrals: Colonoscopy: Referral placed Recommended yearly ophthalmology/optometry visit for glaucoma screening and checkup Recommended yearly dental visit for hygiene and checkup  Vaccinations: Influenza vaccine: up to date Pneumococcal vaccine: Education provided Tdap vaccine: Up to date Shingles vaccine: Education provided    Advanced directives: Education provided  Conditions/risks identified: na  Next appointment: 05-09-2021 @ 8:40  Dr. Carlota Raspberry  Preventive Care 67 Years and Older, Male Preventive care refers to lifestyle choices and visits with your health care provider that can promote health and wellness. What does preventive care include? A yearly physical exam. This is also called an annual well check. Dental exams once or twice a year. Routine eye exams. Ask your health care provider how often you should have your eyes checked. Personal lifestyle choices, including: Daily care of your teeth and gums. Regular physical activity. Eating a healthy diet. Avoiding tobacco and drug use. Limiting alcohol use. Practicing safe sex. Taking low doses of aspirin every day. Taking vitamin and mineral supplements as recommended by your health care provider. What happens during an annual well check? The services and screenings done by your health care provider during your annual well check will depend on your age, overall health, lifestyle risk factors, and family history of disease. Counseling  Your health care provider may ask you questions about your: Alcohol use. Tobacco use. Drug use. Emotional well-being. Home and relationship well-being. Sexual activity. Eating habits. History of falls. Memory and ability to  understand (cognition). Work and work Statistician. Screening  You may have the following tests or measurements: Height, weight, and BMI. Blood pressure. Lipid and cholesterol levels. These may be checked every 5 years, or more frequently if you are over 59 years old. Skin check. Lung cancer screening. You may have this screening every year starting at age 107 if you have a 30-pack-year history of smoking and currently smoke or have quit within the past 15 years. Fecal occult blood test (FOBT) of the stool. You may have this test every year starting at age 33. Flexible sigmoidoscopy or colonoscopy. You may have a sigmoidoscopy every 5 years or a colonoscopy every 10 years starting at age 26. Prostate cancer screening. Recommendations will vary depending on your family history and other risks. Hepatitis C blood test. Hepatitis B blood test. Sexually transmitted disease (STD) testing. Diabetes screening. This is done by checking your blood sugar (glucose) after you have not eaten for a while (fasting). You may have this done every 1-3 years. Abdominal aortic aneurysm (AAA) screening. You may need this if you are a current or former smoker. Osteoporosis. You may be screened starting at age 14 if you are at high risk. Talk with your health care provider about your test results, treatment options, and if necessary, the need for more tests. Vaccines  Your health care provider may recommend certain vaccines, such as: Influenza vaccine. This is recommended every year. Tetanus, diphtheria, and acellular pertussis (Tdap, Td) vaccine. You may need a Td booster every 10 years. Zoster vaccine. You may need this after age 22. Pneumococcal 13-valent conjugate (PCV13) vaccine. One dose is recommended after age 47. Pneumococcal polysaccharide (PPSV23) vaccine. One dose is recommended after age 18. Talk to your health care provider about which screenings and vaccines  you need and how often you need them. This  information is not intended to replace advice given to you by your health care provider. Make sure you discuss any questions you have with your health care provider. Document Released: 09/15/2015 Document Revised: 05/08/2016 Document Reviewed: 06/20/2015 Elsevier Interactive Patient Education  2017 [place] Prevention in the Home Falls can cause injuries. They can happen to people of all ages. There are many things you can do to make your home safe and to help prevent falls. What can I do on the outside of my home? Regularly fix the edges of walkways and driveways and fix any cracks. Remove anything that might make you trip as you walk through a door, such as a raised step or threshold. Trim any bushes or trees on the path to your home. Use bright outdoor lighting. Clear any walking paths of anything that might make someone trip, such as rocks or tools. Regularly check to see if handrails are loose or broken. Make sure that both sides of any steps have handrails. Any raised decks and porches should have guardrails on the edges. Have any leaves, snow, or ice cleared regularly. Use sand or salt on walking paths during winter. Clean up any spills in your garage right away. This includes oil or grease spills. What can I do in the bathroom? Use night lights. Install grab bars by the toilet and in the tub and shower. Do not use towel bars as grab bars. Use non-skid mats or decals in the tub or shower. If you need to sit down in the shower, use a plastic, non-slip stool. Keep the floor dry. Clean up any water that spills on the floor as soon as it happens. Remove soap buildup in the tub or shower regularly. Attach bath mats securely with double-sided non-slip rug tape. Do not have throw rugs and other things on the floor that can make you trip. What can I do in the bedroom? Use night lights. Make sure that you have a light by your bed that is easy to reach. Do not use any sheets or  blankets that are too big for your bed. They should not hang down onto the floor. Have a firm chair that has side arms. You can use this for support while you get dressed. Do not have throw rugs and other things on the floor that can make you trip. What can I do in the kitchen? Clean up any spills right away. Avoid walking on wet floors. Keep items that you use a lot in easy-to-reach places. If you need to reach something above you, use a strong step stool that has a grab bar. Keep electrical cords out of the way. Do not use floor polish or wax that makes floors slippery. If you must use wax, use non-skid floor wax. Do not have throw rugs and other things on the floor that can make you trip. What can I do with my stairs? Do not leave any items on the stairs. Make sure that there are handrails on both sides of the stairs and use them. Fix handrails that are broken or loose. Make sure that handrails are as long as the stairways. Check any carpeting to make sure that it is firmly attached to the stairs. Fix any carpet that is loose or worn. Avoid having throw rugs at the top or bottom of the stairs. If you do have throw rugs, attach them to the floor with carpet tape. Make sure  that you have a light switch at the top of the stairs and the bottom of the stairs. If you do not have them, ask someone to add them for you. What else can I do to help prevent falls? Wear shoes that: Do not have high heels. Have rubber bottoms. Are comfortable and fit you well. Are closed at the toe. Do not wear sandals. If you use a stepladder: Make sure that it is fully opened. Do not climb a closed stepladder. Make sure that both sides of the stepladder are locked into place. Ask someone to hold it for you, if possible. Clearly mark and make sure that you can see: Any grab bars or handrails. First and last steps. Where the edge of each step is. Use tools that help you move around (mobility aids) if they are  needed. These include: Canes. Walkers. Scooters. Crutches. Turn on the lights when you go into a dark area. Replace any light bulbs as soon as they burn out. Set up your furniture so you have a clear path. Avoid moving your furniture around. If any of your floors are uneven, fix them. If there are any pets around you, be aware of where they are. Review your medicines with your doctor. Some medicines can make you feel dizzy. This can increase your chance of falling. Ask your doctor what other things that you can do to help prevent falls. This information is not intended to replace advice given to you by your health care provider. Make sure you discuss any questions you have with your health care provider. Document Released: 06/15/2009 Document Revised: 01/25/2016 Document Reviewed: 09/23/2014 Elsevier Interactive Patient Education  2017 [employer].

## 2021-03-09 DIAGNOSIS — C61 Malignant neoplasm of prostate: Secondary | ICD-10-CM | POA: Diagnosis not present

## 2021-03-16 DIAGNOSIS — N3946 Mixed incontinence: Secondary | ICD-10-CM | POA: Diagnosis not present

## 2021-03-16 DIAGNOSIS — C61 Malignant neoplasm of prostate: Secondary | ICD-10-CM | POA: Diagnosis not present

## 2021-03-22 DIAGNOSIS — H524 Presbyopia: Secondary | ICD-10-CM | POA: Diagnosis not present

## 2021-03-22 DIAGNOSIS — H5203 Hypermetropia, bilateral: Secondary | ICD-10-CM | POA: Diagnosis not present

## 2021-03-22 DIAGNOSIS — H52223 Regular astigmatism, bilateral: Secondary | ICD-10-CM | POA: Diagnosis not present

## 2021-04-12 ENCOUNTER — Other Ambulatory Visit: Payer: Self-pay | Admitting: Family Medicine

## 2021-04-12 DIAGNOSIS — E785 Hyperlipidemia, unspecified: Secondary | ICD-10-CM

## 2021-04-12 DIAGNOSIS — I10 Essential (primary) hypertension: Secondary | ICD-10-CM

## 2021-05-09 ENCOUNTER — Ambulatory Visit (INDEPENDENT_AMBULATORY_CARE_PROVIDER_SITE_OTHER): Payer: Medicare HMO | Admitting: Family Medicine

## 2021-05-09 ENCOUNTER — Other Ambulatory Visit: Payer: Self-pay

## 2021-05-09 VITALS — BP 126/78 | HR 88 | Temp 98.0°F | Resp 17 | Ht 72.0 in | Wt 206.0 lb

## 2021-05-09 DIAGNOSIS — I10 Essential (primary) hypertension: Secondary | ICD-10-CM

## 2021-05-09 DIAGNOSIS — R7303 Prediabetes: Secondary | ICD-10-CM

## 2021-05-09 DIAGNOSIS — E785 Hyperlipidemia, unspecified: Secondary | ICD-10-CM

## 2021-05-09 LAB — LIPID PANEL
Cholesterol: 139 mg/dL (ref 0–200)
HDL: 37.7 mg/dL — ABNORMAL LOW (ref >39.00)
LDL Cholesterol: 81 mg/dL (ref 0–99)
NonHDL: 101.45
Total CHOL/HDL Ratio: 4
Triglycerides: 103 mg/dL (ref 0.0–149.0)
VLDL: 20.6 mg/dL (ref 0.0–40.0)

## 2021-05-09 LAB — COMPREHENSIVE METABOLIC PANEL
ALT: 37 U/L (ref 0–53)
AST: 28 U/L (ref 0–37)
Albumin: 4 g/dL (ref 3.5–5.2)
Alkaline Phosphatase: 47 U/L (ref 39–117)
BUN: 22 mg/dL (ref 6–23)
CO2: 27 mEq/L (ref 19–32)
Calcium: 9.4 mg/dL (ref 8.4–10.5)
Chloride: 101 mEq/L (ref 96–112)
Creatinine, Ser: 0.57 mg/dL (ref 0.40–1.50)
GFR: 101.8 mL/min (ref >60.00)
Glucose, Bld: 111 mg/dL — ABNORMAL HIGH (ref 70–99)
Potassium: 3.8 mEq/L (ref 3.5–5.1)
Sodium: 138 mEq/L (ref 135–145)
Total Bilirubin: 1.1 mg/dL (ref 0.2–1.2)
Total Protein: 6.6 g/dL (ref 6.0–8.3)

## 2021-05-09 LAB — HEMOGLOBIN A1C: Hgb A1c MFr Bld: 5.6 % (ref 4.6–6.5)

## 2021-05-09 MED ORDER — SIMVASTATIN 20 MG PO TABS: ORAL_TABLET | ORAL | 1 refills | Status: DC

## 2021-05-09 MED ORDER — LOSARTAN POTASSIUM 50 MG PO TABS: 50.0000 mg | ORAL_TABLET | Freq: Every day | ORAL | 1 refills | Status: DC

## 2021-05-09 NOTE — Progress Notes (Signed)
Subjective:  Patient ID: OZIE PHOU, male    DOB: [birth year]  Age: 65 y.o. MRN: [id number]  CC:  Chief Complaint  Patient presents with   Prediabetes    Pt here for recheck labs, no concerns or sxs.    Hypertension    Pt denies physical sxs, no concerns, recent refill losartan    Hyperlipidemia    Pt due for recheck recent refills sent     HPI TRUSTIN FAVORITO presents for   Hypertension: Losartan 50 mg daily Home readings: rare - 120/80 or below.  No new side effects.  Leg atrophy - followed by neuro - concern for possible charcot marie tooth. Testing cost prohibitive and not changing approach. - deferring testing. Walking and trying some exercises.  Keeping a reasonable mental attitude. Denies depression.  BP Readings from Last 3 Encounters:  05/09/21 126/78  01/09/21 124/82  11/08/20 121/74   Lab Results  Component Value Date   CREATININE 0.59 (L) 11/08/2020    Hyperlipidemia: Zocor 20 mg daily. No new myalgias, no new side effects. Rare ibuprofen as needed for aches.  Lab Results  Component Value Date   CHOL 143 11/08/2020   HDL 42 11/08/2020   LDLCALC 82 11/08/2020   TRIG 102 11/08/2020   CHOLHDL 3.4 11/08/2020   Lab Results  Component Value Date   ALT 35 11/08/2020   AST 26 11/08/2020   ALKPHOS 59 11/08/2020   BILITOT 0.8 11/08/2020    Prediabetes: Weight stable. No change in diet/exercise. Lab Results  Component Value Date   HGBA1C 5.7 (H) 11/08/2020   Wt Readings from Last 3 Encounters:  05/09/21 206 lb (93.4 kg)  01/09/21 206 lb 6.4 oz (93.6 kg)  11/08/20 206 lb (93.4 kg)    History Patient Active Problem List   Diagnosis Date Noted   Left rotator cuff tear 10/22/2018   Risk for coronary artery disease between 10% and 20% in next 10 years 11/27/2016   Prostate cancer ([place]) 09/14/2012   Abnormal gait 05/21/2012   HTN (hypertension) 02/03/2012   Peripheral neuropathy 02/03/2012   Past Medical History:  Diagnosis Date   Allergy     Arthritis    Cancer ([place]) 2008   prostate   GERD (gastroesophageal reflux disease)    Hyperlipidemia    Hypertension    Neuromuscular disorder ([place])    Prostate cancer ([place])    [id number] adenocarcinoma of the prostate   Past Surgical History:  Procedure Laterality Date   CHOLECYSTECTOMY N/A 03/16/2015   Procedure: LAPAROSCOPIC CHOLECYSTECTOMY WITH INTRAOPERATIVE CHOLANGIOGRAM;  Surgeon: [name], MD;  Location: [facility];  Service: General;  Laterality: N/A;   JOINT REPLACEMENT Right 08/2009   knee   REPLACEMENT TOTAL KNEE  2010   right knee   ROTATOR CUFF REPAIR Left 06/21/2015   No Known Allergies Prior to Admission medications   Medication Sig Start Date End Date Taking? Authorizing Provider  aspirin EC 81 MG tablet Take 1 tablet (81 mg total) by mouth daily. 02/23/18  Yes Tereasa Coop, PA-C  losartan (COZAAR) 50 MG tablet TAKE 1 TABLET BY MOUTH EVERY DAY 04/12/21  Yes Wendie Agreste, MD  Multiple Vitamin (MULTIVITAMIN) tablet Take 1 tablet by mouth daily.   Yes [provider]  simvastatin (ZOCOR) 20 MG tablet TAKE 1 TABLET BY MOUTH EVERYDAY AT BEDTIME 04/12/21  Yes Wendie Agreste, MD  cetirizine (ZYRTEC) 10 MG chewable tablet Chew 10 mg by mouth as needed. Patient not taking: Reported on  02/26/2021    [provider]   Social History   Socioeconomic History   Marital status: Married    Spouse name: karen   Number of children: Not on file   Years of education: Not on file   Highest education level: Not on file  Occupational History   Occupation: retired  Tobacco Use   Smoking status: Never   Smokeless tobacco: Never  Substance and Sexual Activity   Alcohol use: Yes    Comment: rarely   Drug use: No   Sexual activity: Yes  Other Topics Concern   Not on file  Social History Narrative   Lives with spouse   Right Handed   Drinks 4 cups caffeine   Social Determinants of Health   Financial Resource Strain: Low Risk    Difficulty of  Paying Living Expenses: Not hard at all  Food Insecurity: No Food Insecurity   Worried About Charity fundraiser in the Last Year: Never true   [place] in the Last Year: Never true  Transportation Needs: No Transportation Needs   Lack of Transportation (Medical): No   Lack of Transportation (Non-Medical): No  Physical Activity: Inactive   Days of Exercise per Week: 0 days   Minutes of Exercise per Session: 0 min  Stress: No Stress Concern Present   Feeling of Stress : Not at all  Social Connections: Socially Integrated   Frequency of Communication with Friends and Family: More than three times a week   Frequency of Social Gatherings with Friends and Family: More than three times a week   Attends Religious Services: More than 4 times per year   Active Member of [employer] or Organizations: Yes   Attends Archivist Meetings: 1 to 4 times per year   Marital Status: Married  Human resources officer Violence: Not At Risk   Fear of Current or Ex-Partner: No   Emotionally Abused: No   Physically Abused: No   Sexually Abused: No    Review of Systems  Constitutional:  Negative for fatigue and unexpected weight change.  Eyes:  Negative for visual disturbance.  Respiratory:  Negative for cough, chest tightness and shortness of breath.   Cardiovascular:  Negative for chest pain, palpitations and leg swelling.  Gastrointestinal:  Negative for abdominal pain and blood in stool.  Neurological:  Negative for dizziness, light-headedness and headaches.    Objective:   Vitals:   05/09/21 0834  BP: 126/78  Pulse: 88  Resp: 17  Temp: 98 F (36.7 C)  TempSrc: Temporal  SpO2: 97%  Weight: 206 lb (93.4 kg)  Height: 6' (1.829 m)     Physical Exam Vitals reviewed.  Constitutional:      Appearance: He is well-developed.  HENT:     Head: Normocephalic and atraumatic.  Neck:     Vascular: No carotid bruit or JVD.  Cardiovascular:     Rate and Rhythm: Normal rate and regular  rhythm.     Heart sounds: Normal heart sounds. No murmur heard. Pulmonary:     Effort: Pulmonary effort is normal.     Breath sounds: Normal breath sounds. No rales.  Musculoskeletal:     Right lower leg: No edema.     Left lower leg: No edema.  Skin:    General: Skin is warm and dry.  Neurological:     Mental Status: He is alert and oriented to person, place, and time.  Psychiatric:        Mood  and Affect: Mood normal.     Assessment & Plan:  YONA FUHRMANN is a 65 y.o. male . Prediabetes - Plan: Comprehensive metabolic panel, Hemoglobin A1c  -  Stable weight, Labs pending as above.   Hypertension, well controlled - Plan: Comprehensive metabolic panel  -  Stable, tolerating current regimen. Medications refilled. Labs pending as above.   Hyperlipidemia, unspecified hyperlipidemia type - Plan: Comprehensive metabolic panel, Lipid panel  - Stable, tolerating current regimen. Medications refilled. Labs pending as above.    No orders of the defined types were placed in this encounter.  Patient Instructions  No change in medications today.  If any concerns on labs I will let you know.  Follow-up with me in 6 months but please let me know if there are questions sooner.  Thanks for coming in today and take care.     Signed,   Merri Ray, MD [place] 05/09/21 9:23 AM

## 2021-05-09 NOTE — Patient Instructions (Signed)
No change in medications today.  If any concerns on labs I will let you know.  Follow-up with me in 6 months but please let me know if there are questions sooner.  Thanks for coming in today and take care.

## 2021-05-15 ENCOUNTER — Encounter: Payer: Self-pay | Admitting: Neurology

## 2021-05-15 ENCOUNTER — Ambulatory Visit: Payer: Medicare HMO | Admitting: Neurology

## 2021-05-15 VITALS — BP 138/81 | HR 94 | Ht 72.0 in | Wt 209.0 lb

## 2021-05-15 DIAGNOSIS — R269 Unspecified abnormalities of gait and mobility: Secondary | ICD-10-CM

## 2021-05-15 DIAGNOSIS — G600 Hereditary motor and sensory neuropathy: Secondary | ICD-10-CM

## 2021-05-15 NOTE — Progress Notes (Signed)
Reason for visit: Gait disorder, muscle weakness  Travis Ellis is an 65 y.o. male  History of present illness:  Travis Ellis is a 65 year old right-handed white male with a history of borderline diabetes and a slowly progressive primarily motor neuropathy.  The patient recently had some difficulty with some weakness of hip flexion on the right, he was found to have arthritis affecting the right greater than left hip joint, he had injection which significant improved his discomfort and strength.  He is now near his usual baseline.  He walks with a cane with a wide-based stance.  He has not had a fall in over a year.  The patient does have an AFO brace, but he does not use it because it does not fit well.  He is still operating motor vehicle.  He has mild weakness of the arms.  Past Medical History:  Diagnosis Date   Allergy    Arthritis    Cancer ([place]) 2008   prostate   GERD (gastroesophageal reflux disease)    Hyperlipidemia    Hypertension    Neuromuscular disorder ([facility])    Prostate cancer ([facility])    pT2aN0Mx adenocarcinoma of the prostate    Past Surgical History:  Procedure Laterality Date   CHOLECYSTECTOMY N/A 03/16/2015   Procedure: LAPAROSCOPIC CHOLECYSTECTOMY WITH INTRAOPERATIVE CHOLANGIOGRAM;  Surgeon: [name], MD;  Location: [facility];  Service: General;  Laterality: N/A;   JOINT REPLACEMENT Right 08/2009   knee   REPLACEMENT TOTAL KNEE  2010   right knee   ROTATOR CUFF REPAIR Left 06/21/2015    Family History  Problem Relation Age of Onset   Breast cancer Mother    Cancer Mother        skin   Heart disease Father        heart attack   COPD Father        smoker   Hypertension Father    Heart disease Paternal Grandfather    Hyperlipidemia Paternal Grandfather     Social history:  reports that he has never smoked. He has never used smokeless tobacco. He reports current alcohol use. He reports that he does not use drugs.   No Known  Allergies  Medications:  Prior to Admission medications   Medication Sig Start Date End Date Taking? Authorizing Provider  aspirin EC 81 MG tablet Take 1 tablet (81 mg total) by mouth daily. 02/23/18   Tereasa Coop, PA-C  cetirizine (ZYRTEC) 10 MG chewable tablet Chew 10 mg by mouth as needed. Patient not taking: Reported on 02/26/2021    [provider]  losartan (COZAAR) 50 MG tablet Take 1 tablet (50 mg total) by mouth daily. 05/09/21   Wendie Agreste, MD  Multiple Vitamin (MULTIVITAMIN) tablet Take 1 tablet by mouth daily.    [provider]  simvastatin (ZOCOR) 20 MG tablet TAKE 1 TABLET BY MOUTH EVERYDAY AT BEDTIME 05/09/21   Wendie Agreste, MD    ROS:  Out of a complete 14 system review of symptoms, the patient complains only of the following symptoms, and all other reviewed systems are negative.  Walking difficulty Arthritis pain  Blood pressure 138/81, pulse 94, height 6' (1.829 m), weight 209 lb (94.8 kg).  Physical Exam  General: The patient is alert and cooperative at the time of the examination.  Skin: No significant peripheral edema is noted.   Neurologic Exam  Mental status: The patient is alert and oriented x 3 at the time of  the examination. The patient has apparent normal recent and remote memory, with an apparently normal attention span and concentration ability.   Cranial nerves: Facial symmetry is present. Speech is normal, no aphasia or dysarthria is noted. Extraocular movements are full. Visual fields are full.  Motor: The patient has good strength in all 4 extremities, with exception some mild weakness of the deltoid muscles and with external rotation of the arms bilaterally and significant weakness distally in both lower extremities, prominent bilateral foot drop.  Hip flexion is 4+/5 on the right, near normal on the left.  Sensory examination: Soft touch sensation is symmetric on the face, arms, and legs.  Coordination: The  patient has good finger-nose-finger and heel-to-shin bilaterally.  Gait and station: The patient has a wide-based gait, legs are bowed, the patient uses a cane for ambulation.  Reflexes: Deep tendon reflexes are symmetric.   Assessment/Plan:  1.  Hereditary motor neuropathy/possible distal spinal muscular atrophy  2.  Gait disorder  The patient has no family history of similar progressive weakness, the patient may have a recessive disease.  The patient however has gained good improvement with injection around the right hip.  This has returned function of the right leg.  He does have some mild weakness in the arms as well.  He will be followed conservatively over time, he will follow-up in 1 year, he may see Dr. [name] in the future.  Jill Alexanders MD 05/15/2021 2:09 PM  [facility] [address] [place], [place] [address]  Phone [phone] Fax [phone]

## 2021-06-04 ENCOUNTER — Encounter: Payer: Self-pay | Admitting: Family Medicine

## 2021-07-19 DIAGNOSIS — L821 Other seborrheic keratosis: Secondary | ICD-10-CM | POA: Diagnosis not present

## 2021-07-19 DIAGNOSIS — L820 Inflamed seborrheic keratosis: Secondary | ICD-10-CM | POA: Diagnosis not present

## 2021-07-23 ENCOUNTER — Ambulatory Visit (AMBULATORY_SURGERY_CENTER): Payer: Medicare HMO

## 2021-07-23 ENCOUNTER — Other Ambulatory Visit: Payer: Self-pay

## 2021-07-23 ENCOUNTER — Encounter: Payer: Self-pay | Admitting: Internal Medicine

## 2021-07-23 VITALS — Ht 72.0 in | Wt 210.0 lb

## 2021-07-23 DIAGNOSIS — Z1211 Encounter for screening for malignant neoplasm of colon: Secondary | ICD-10-CM

## 2021-07-23 MED ORDER — PLENVU 140 G PO SOLR: 1.0000 | ORAL | 0 refills | Status: DC

## 2021-07-23 NOTE — Progress Notes (Signed)
Pre visit completed via phone call; Patient verified name, DOB, and address; No egg or soy allergy known to patient  No issues known to pt with past sedation with any surgeries or procedures Patient denies ever being told they had issues or difficulty with intubation  No FH of Malignant Hyperthermia Pt is not on diet pills Pt is not on home 02  Pt is not on blood thinners  Pt denies issues with constipation  No A fib or A flutter Pt is fully vaccinated for Covid x 2; Medicare Coupon given to pt in PV today and NO PA's for preps discussed with pt in PV today  Discussed with pt there will be an out-of-pocket cost for prep and that varies from $0 to 70 +  dollars - pt verbalized understanding  Due to the COVID-19 pandemic we are asking patients to follow certain guidelines in PV and the [place]   Pt aware of COVID protocols and [facility] guidelines

## 2021-08-02 ENCOUNTER — Ambulatory Visit (AMBULATORY_SURGERY_CENTER): Payer: Medicare HMO | Admitting: Internal Medicine

## 2021-08-02 ENCOUNTER — Encounter: Payer: Self-pay | Admitting: Internal Medicine

## 2021-08-02 VITALS — BP 111/69 | HR 70 | Temp 97.4°F | Resp 12 | Ht 72.0 in | Wt 210.0 lb

## 2021-08-02 DIAGNOSIS — I10 Essential (primary) hypertension: Secondary | ICD-10-CM | POA: Diagnosis not present

## 2021-08-02 DIAGNOSIS — E669 Obesity, unspecified: Secondary | ICD-10-CM | POA: Diagnosis not present

## 2021-08-02 DIAGNOSIS — Z1211 Encounter for screening for malignant neoplasm of colon: Secondary | ICD-10-CM

## 2021-08-02 DIAGNOSIS — E785 Hyperlipidemia, unspecified: Secondary | ICD-10-CM | POA: Diagnosis not present

## 2021-08-02 MED ORDER — SODIUM CHLORIDE 0.9 % IV SOLN: 500.0000 mL | Freq: Once | INTRAVENOUS | Status: AC

## 2021-08-02 NOTE — Op Note (Signed)
[facility] Patient Name: Travis Ellis Procedure Date: 08/02/2021 11:51 AM MRN: [id number] Endoscopist: Docia Chuck. Henrene Pastor , MD Age: 65 Referring MD:  Date of Birth: May 31, 1956 Gender: Male Account #: [id number] Procedure:                Colonoscopy Indications:              Screen for colon cancer, average risk. Previous                            examination 2012 Medicines:                Monitored Anesthesia Care Procedure:                Pre-Anesthesia Assessment:                           - Prior to the procedure, a History and Physical                            was performed, and patient medications and                            allergies were reviewed. The patient's tolerance of                            previous anesthesia was also reviewed. The risks                            and benefits of the procedure and the sedation                            options and risks were discussed with the patient.                            All questions were answered, and informed consent                            was obtained. Prior Anticoagulants: The patient has                            taken no previous anticoagulant or antiplatelet                            agents. ASA Grade Assessment: II - A patient with                            mild systemic disease. After reviewing the risks                            and benefits, the patient was deemed in                            satisfactory condition to undergo the procedure.  After obtaining informed consent, the colonoscope                            was passed under direct vision. Throughout the                            procedure, the patient's blood pressure, pulse, and                            oxygen saturations were monitored continuously. The                            Olympus CF-HQ190L [id number]) Colonoscope was                            introduced through the anus and advanced to the the                             cecum, identified by appendiceal orifice and                            ileocecal valve. The ileocecal valve, appendiceal                            orifice, and rectum were photographed. The quality                            of the bowel preparation was excellent. The                            colonoscopy was performed without difficulty. The                            patient tolerated the procedure well. The bowel                            preparation used was Prepopik via split dose                            instruction. Scope In: 12:05:00 PM Scope Out: 82:99:37 PM Scope Withdrawal Time: 0 hours 7 minutes 32 seconds  Total Procedure Duration: 0 hours 13 minutes 19 seconds  Findings:                 The entire examined colon appeared normal on direct                            and retroflexion views.                           Internal hemorrhoids were found during                            retroflexion. The hemorrhoids were small. Complications:            No immediate complications.  Estimated blood loss:                            None. Estimated Blood Loss:     Estimated blood loss: none. Impression:               - The entire examined colon is normal on direct and                            retroflexion views.                           - Internal hemorrhoids.                           - No specimens collected. Recommendation:           - Repeat colonoscopy in 10 years for screening                            purposes.                           - Patient has a contact number available for                            emergencies. The signs and symptoms of potential                            delayed complications were discussed with the                            patient. Return to normal activities tomorrow.                            Written discharge instructions were provided to the                            patient.                           -  Resume previous diet.                           - Continue present medications. Docia Chuck. Henrene Pastor, MD 08/02/2021 12:23:29 PM This report has been signed electronically.

## 2021-08-02 NOTE — Progress Notes (Signed)
Report to PACU, RN, vss, BBS= Clear.  

## 2021-08-02 NOTE — Progress Notes (Signed)
VS- Travis Ellis  Pt's states no medical or surgical changes since previsit or office visit.  

## 2021-08-02 NOTE — Patient Instructions (Signed)
YOU HAD AN ENDOSCOPIC PROCEDURE TODAY AT THE [facility]:   Refer to the procedure report that was given to you for any specific questions about what was found during the examination.  If the procedure report does not answer your questions, please call your gastroenterologist to clarify.  If you requested that your care partner not be given the details of your procedure findings, then the procedure report has been included in a sealed envelope for you to review at your convenience later.  YOU SHOULD EXPECT: Some feelings of bloating in the abdomen. Passage of more gas than usual.  Walking can help get rid of the air that was put into your GI tract during the procedure and reduce the bloating. If you had a lower endoscopy (such as a colonoscopy or flexible sigmoidoscopy) you may notice spotting of blood in your stool or on the toilet paper. If you underwent a bowel prep for your procedure, you may not have a normal bowel movement for a few days.  Please Note:  You might notice some irritation and congestion in your nose or some drainage.  This is from the oxygen used during your procedure.  There is no need for concern and it should clear up in a day or so.  SYMPTOMS TO REPORT IMMEDIATELY:  Following lower endoscopy (colonoscopy or flexible sigmoidoscopy):  Excessive amounts of blood in the stool  Significant tenderness or worsening of abdominal pains  Swelling of the abdomen that is new, acute  Fever of 100F or higher   For urgent or emergent issues, a gastroenterologist can be reached at any hour by calling [phone]. Do not use MyChart messaging for urgent concerns.    DIET:  We do recommend a small meal at first, but then you may proceed to your regular diet.  Drink plenty of fluids but you should avoid alcoholic beverages for 24 hours.  MEDICATIONS:  Continue present medications.  Please see handouts given to you by your recovery nurse.  Thank you for allowing us to  provide for your healthcare needs today.  ACTIVITY:  You should plan to take it easy for the rest of today and you should NOT DRIVE or use heavy machinery until tomorrow (because of the sedation medicines used during the test).    FOLLOW UP: Our staff will call the number listed on your records 48-72 hours following your procedure to check on you and address any questions or concerns that you may have regarding the information given to you following your procedure. If we do not reach you, we will leave a message.  We will attempt to reach you two times.  During this call, we will ask if you have developed any symptoms of COVID 19. If you develop any symptoms (ie: fever, flu-like symptoms, shortness of breath, cough etc.) before then, please call [phone].  If you test positive for Covid 19 in the 2 weeks post procedure, please call and report this information to us.    If any biopsies were taken you will be contacted by phone or by letter within the next 1-3 weeks.  Please call us at [phone] if you have not heard about the biopsies in 3 weeks.    SIGNATURES/CONFIDENTIALITY: You and/or your care partner have signed paperwork which will be entered into your electronic medical record.  These signatures attest to the fact that that the information above on your After Visit Summary has been reviewed and is understood.  Full responsibility of the   confidentiality of this discharge information lies with you and/or your care-partner.  

## 2021-08-06 ENCOUNTER — Telehealth: Payer: Self-pay

## 2021-08-06 NOTE — Telephone Encounter (Signed)
Left message on follow up call. 

## 2021-08-06 NOTE — Telephone Encounter (Signed)
  Follow up Call-  Call back number 08/02/2021  Post procedure Call Back phone  # [phone]  Permission to leave phone message Yes  Some recent data might be hidden     Patient questions:  Do you have a fever, pain , or abdominal swelling? No. Pain Score  0 *  Have you tolerated food without any problems? Yes.    Have you been able to return to your normal activities? Yes.    Do you have any questions about your discharge instructions: Diet   No. Medications  No. Follow up visit  No.  Do you have questions or concerns about your Care? No.  Actions: * If pain score is 4 or above: No action needed, pain <4.  Have you developed a fever since your procedure? no  2.   Have you had an respiratory symptoms (SOB or cough) since your procedure? no  3.   Have you tested positive for COVID 19 since your procedure no  4.   Have you had any family members/close contacts diagnosed with the COVID 19 since your procedure?  no   If yes to any of these questions please route to Joylene Zakarie, RN and Joella Prince, RN

## 2021-11-07 ENCOUNTER — Ambulatory Visit (INDEPENDENT_AMBULATORY_CARE_PROVIDER_SITE_OTHER): Payer: Medicare HMO | Admitting: Family Medicine

## 2021-11-07 VITALS — BP 128/78 | HR 78 | Temp 97.7°F | Resp 15 | Ht 72.0 in | Wt 209.0 lb

## 2021-11-07 DIAGNOSIS — E785 Hyperlipidemia, unspecified: Secondary | ICD-10-CM | POA: Diagnosis not present

## 2021-11-07 DIAGNOSIS — S39012A Strain of muscle, fascia and tendon of lower back, initial encounter: Secondary | ICD-10-CM

## 2021-11-07 DIAGNOSIS — R269 Unspecified abnormalities of gait and mobility: Secondary | ICD-10-CM | POA: Diagnosis not present

## 2021-11-07 DIAGNOSIS — I10 Essential (primary) hypertension: Secondary | ICD-10-CM | POA: Diagnosis not present

## 2021-11-07 DIAGNOSIS — M25511 Pain in right shoulder: Secondary | ICD-10-CM | POA: Diagnosis not present

## 2021-11-07 LAB — COMPREHENSIVE METABOLIC PANEL
ALT: 43 U/L (ref 0–53)
AST: 32 U/L (ref 0–37)
Albumin: 4.4 g/dL (ref 3.5–5.2)
Alkaline Phosphatase: 52 U/L (ref 39–117)
BUN: 23 mg/dL (ref 6–23)
CO2: 26 mEq/L (ref 19–32)
Calcium: 9.7 mg/dL (ref 8.4–10.5)
Chloride: 101 mEq/L (ref 96–112)
Creatinine, Ser: 0.51 mg/dL (ref 0.40–1.50)
GFR: 104.91 mL/min (ref >60.00)
Glucose, Bld: 92 mg/dL (ref 70–99)
Potassium: 3.9 mEq/L (ref 3.5–5.1)
Sodium: 138 mEq/L (ref 135–145)
Total Bilirubin: 1.2 mg/dL (ref 0.2–1.2)
Total Protein: 6.8 g/dL (ref 6.0–8.3)

## 2021-11-07 LAB — LIPID PANEL
Cholesterol: 142 mg/dL (ref 0–200)
HDL: 41.5 mg/dL (ref >39.00)
LDL Cholesterol: 78 mg/dL (ref 0–99)
NonHDL: 100.1
Total CHOL/HDL Ratio: 3
Triglycerides: 111 mg/dL (ref 0.0–149.0)
VLDL: 22.2 mg/dL (ref 0.0–40.0)

## 2021-11-07 MED ORDER — MELOXICAM 7.5 MG PO TABS: 7.5000 mg | ORAL_TABLET | Freq: Every day | ORAL | 0 refills | Status: DC

## 2021-11-07 MED ORDER — SIMVASTATIN 20 MG PO TABS: ORAL_TABLET | ORAL | 1 refills | Status: DC

## 2021-11-07 MED ORDER — LOSARTAN POTASSIUM 50 MG PO TABS: 50.0000 mg | ORAL_TABLET | Freq: Every day | ORAL | 1 refills | Status: DC

## 2021-11-07 NOTE — Patient Instructions (Addendum)
Mobic in place of ibuprofen temporarily -do not take both.. Heat or ice to affected area of back. Follow up in next 2 weeks if not improving, sooner if worse.  ? ?Follow up with Dr. [name] for your right shoulder and upper arm. Let me know if he needs a referral or if unable to see you soon.  ? ?No change in cholesterol, blood pressure meds for now.  I will let you know if there are concerns on labs ? ?Please call neuro for sooner appointment if your leg symptoms have progressed since last visit with Dr.  [name].  If any acute changes please follow-up with me sooner or let me know if I can help further. ? ?If there are other concerns we did not have a chance to discuss today, please schedule separate visit.  Thank you for coming in today and take care! ? ? ? ? ?  ?

## 2021-11-07 NOTE — Progress Notes (Signed)
Subjective:  Patient ID: Travis Ellis, male    DOB: 15-Jun-1956  Age: 66 y.o. MRN: [id number]  CC:  Chief Complaint  Patient presents with   Hypertension    Pt denies physical sxs, denies questions, pt has not recently taken BP, notes he rarely forget medication possibly 1-2 a month    Hyperlipidemia    Pt is fasting this am, pt notes he does not miss med maybe once or twice since last visit    Back Pain    Pt thinks injured back picking up laundry basket, notes he doesn't think theres anything to be done about this currently wanted to let provider know started 3 days ago    HPI [name] presents for   Hypertension: Losartan 50 mg daily, rare missed doses of this and his simvastatin.  No new side effects. Home readings: rarely - controlled BP Readings from Last 3 Encounters:  11/07/21 128/78  08/02/21 111/69  05/15/21 138/81   Lab Results  Component Value Date   CREATININE 0.51 11/07/2021    Hyperlipidemia: Zocor 20 mg daily.  On aspirin 81 mg daily. No new bleeding, myalgias, or side effects of meds.  Lab Results  Component Value Date   CHOL 142 11/07/2021   HDL 41.50 11/07/2021   LDLCALC 78 11/07/2021   TRIG 111.0 11/07/2021   CHOLHDL 3 11/07/2021   Lab Results  Component Value Date   ALT 43 11/07/2021   AST 32 11/07/2021   ALKPHOS 52 11/07/2021   BILITOT 1.2 11/07/2021   Low back pain Noted after picking up a laundry basket few days ago. R low back muscle sore.  Fine at rest, sore with certain mvmt. No leg radiation.  No bowel or bladder incontinence, no saddle anesthesia, no new lower extremity weakness.  Tx: ibuprofen '800mg'$  few times per day. Improving some.  No hx  of CKD,  PUD.  Sleeping ok.  Lumbar spine MRI in 01/18/21.  Abnormal MRI scan of the lumbar spine without contrast showing prominent degenerative changes L5-S1 with left paracentral disc osteophyte protrusion resulting in severe foraminal narrowing and likely encroachment of the  exiting left S1 nerve root.  These changes appear to be more progressed compared with previous MRI from 06/29/2009  R arm pain Past few months, in bicep area. No pop or known injury. No change in appearance. Worse past month. Sore with activity and rest. Sore to use - difficulty raising arm for air washing. Reaching latch on door.  R hand dominant. Sore at rest and activity.  No prior injury.  No treatments.  Ortho [name], not seen for this issue. Had left shoulder surgery.   Followed by neurology with history of abnormal gait, hereditary sensorimotor neuropathy.  Last visit September 2022 with Dr. [name].  Some improvement at that time after a right hip injection.  Possible distal spinal muscular atrophy.  Right leg function had returned.  Mild weakness of the arms noted, planned conservative treatment with follow-up in 1 year, Dr. [name].  More trouble walking past 6 months, similar sxs discussed with neuro but feels like worse past 6 months - plans to discuss with neuro initially.   History Patient Active Problem List   Diagnosis Date Noted   Left rotator cuff tear 10/22/2018   Risk for coronary artery disease between 10% and 20% in next 10 years 11/27/2016   Prostate cancer ([place]) 09/14/2012   Abnormal gait 05/21/2012   HTN (hypertension) 02/03/2012   Peripheral  neuropathy 02/03/2012   Past Medical History:  Diagnosis Date   Arthritis    generalized   GERD (gastroesophageal reflux disease)    hx of   Hyperlipidemia    on meds   Hypertension    on meds   Neuromuscular disorder ([place])    Peripheral neuropathy    RIGHT sided foot drop   Prostate cancer ([place])    [id number] adenocarcinoma of the prostate   Seasonal allergies    Past Surgical History:  Procedure Laterality Date   CHOLECYSTECTOMY N/A 03/16/2015   Procedure: LAPAROSCOPIC CHOLECYSTECTOMY WITH INTRAOPERATIVE CHOLANGIOGRAM;  Surgeon: [name], MD;  Location: [facility];  Service: General;  Laterality: N/A;    COLONOSCOPY  2012   JP-movi(exc)   FOOT SURGERY Right 1974   stepped on piece of glass- removed glass particles   REPLACEMENT TOTAL KNEE Right 2010   ROTATOR CUFF REPAIR Left 06/21/2015   No Known Allergies Prior to Admission medications   Medication Sig Start Date End Date Taking? Authorizing Provider  aspirin EC 81 MG tablet Take 1 tablet (81 mg total) by mouth daily. 02/23/18  Yes Tereasa Coop, PA-C  losartan (COZAAR) 50 MG tablet Take 1 tablet (50 mg total) by mouth daily. 05/09/21  Yes Wendie Agreste, MD  Multiple Vitamin (MULTIVITAMIN) tablet Take 1 tablet by mouth daily.   Yes [provider]  simvastatin (ZOCOR) 20 MG tablet TAKE 1 TABLET BY MOUTH EVERYDAY AT BEDTIME 05/09/21  Yes Wendie Agreste, MD  cetirizine (ZYRTEC) 10 MG chewable tablet Chew 10 mg by mouth daily as needed.    [provider]   Social History   Socioeconomic History   Marital status: Married    Spouse name: karen   Number of children: Not on file   Years of education: Not on file   Highest education level: Not on file  Occupational History   Occupation: retired  Tobacco Use   Smoking status: Never   Smokeless tobacco: Never  Vaping Use   Vaping Use: Never used  Substance and Sexual Activity   Alcohol use: Yes    Comment: monthly use   Drug use: No   Sexual activity: Yes  Other Topics Concern   Not on file  Social History Narrative   Lives with spouse   Right Handed   Drinks 4 cups caffeine   Social Determinants of Health   Financial Resource Strain: Low Risk    Difficulty of Paying Living Expenses: Not hard at all  Food Insecurity: No Food Insecurity   Worried About Charity fundraiser in the Last Year: Never true   [place] in the Last Year: Never true  Transportation Needs: No Transportation Needs   Lack of Transportation (Medical): No   Lack of Transportation (Non-Medical): No  Physical Activity: Inactive   Days of Exercise per Week: 0 days   Minutes  of Exercise per Session: 0 min  Stress: No Stress Concern Present   Feeling of Stress : Not at all  Social Connections: Socially Integrated   Frequency of Communication with Friends and Family: More than three times a week   Frequency of Social Gatherings with Friends and Family: More than three times a week   Attends Religious Services: More than 4 times per year   Active Member of [employer] or Organizations: Yes   Attends Archivist Meetings: 1 to 4 times per year   Marital Status: Married  Human resources officer Violence: Not At Risk  Fear of Current or Ex-Partner: No   Emotionally Abused: No   Physically Abused: No   Sexually Abused: No    Review of Systems  Constitutional:  Negative for fatigue and unexpected weight change.  Eyes:  Negative for visual disturbance.  Respiratory:  Negative for cough, chest tightness and shortness of breath.   Cardiovascular:  Negative for chest pain, palpitations and leg swelling.  Gastrointestinal:  Negative for abdominal pain and blood in stool.  Neurological:  Negative for dizziness, light-headedness and headaches.    Objective:   Vitals:   11/07/21 1036  BP: 128/78  Pulse: 78  Resp: 15  Temp: 97.7 F (36.5 C)  TempSrc: Temporal  SpO2: 96%  Weight: 209 lb (94.8 kg)  Height: 6' (1.829 m)     Physical Exam Vitals reviewed.  Constitutional:      Appearance: He is well-developed.  HENT:     Head: Normocephalic and atraumatic.  Neck:     Vascular: No carotid bruit or JVD.  Cardiovascular:     Rate and Rhythm: Normal rate and regular rhythm.     Heart sounds: Normal heart sounds. No murmur heard. Pulmonary:     Effort: Pulmonary effort is normal.     Breath sounds: Normal breath sounds. No rales.  Musculoskeletal:     Right lower leg: No edema.     Left lower leg: No edema.     Comments: Lumbar spine - no midline bony ttp. Min R paraspinal lower ttp, negative seated SLR.  R shoulder - limited abduction, flexion to approx  90 degrees     Skin:    General: Skin is warm and dry.  Neurological:     Mental Status: He is alert and oriented to person, place, and time.  Psychiatric:        Mood and Affect: Mood normal.       Assessment & Plan:  NOLAND PIZANO III is a 66 y.o. male . Right shoulder pain, unspecified chronicity  - RTC tendinitis vs biceps tendinitis vs bursitis or combo of all. Has orthopaedic specialist - plans to follow up with him.   Dyslipidemia - Plan: simvastatin (ZOCOR) 20 MG tablet, Lipid panel  -  Stable, tolerating current regimen. Medications refilled. Labs pending as above.   Essential hypertension - Plan: losartan (COZAAR) 50 MG tablet, Comprehensive metabolic panel, Lipid panel  -  Stable, tolerating current regimen. Medications refilled. Labs pending as above.   Strain of lumbar region, initial encounter - Plan: meloxicam (MOBIC) 7.5 MG tablet  - short term mobic in place of ibuprofen for now. Deferred imaging based on exam at this time. Rtc if not improving in next 2 weeks.   Abnormal gait  - follow up with neuro recommended as reports some progression of prior symptoms. Rtc precautions if any acute worsening or new symptoms.   Meds ordered this encounter  Medications   simvastatin (ZOCOR) 20 MG tablet    Sig: TAKE 1 TABLET BY MOUTH EVERYDAY AT BEDTIME    Dispense:  90 tablet    Refill:  1   losartan (COZAAR) 50 MG tablet    Sig: Take 1 tablet (50 mg total) by mouth daily.    Dispense:  90 tablet    Refill:  1   meloxicam (MOBIC) 7.5 MG tablet    Sig: Take 1 tablet (7.5 mg total) by mouth daily.    Dispense:  30 tablet    Refill:  0   Patient Instructions  Mobic  in place of ibuprofen temporarily -do not take both.. Heat or ice to affected area of back. Follow up in next 2 weeks if not improving, sooner if worse.   Follow up with Dr. [name] for your right shoulder and upper arm. Let me know if he needs a referral or if unable to see you soon.   No change in  cholesterol, blood pressure meds for now.  I will let you know if there are concerns on labs  Please call neuro for sooner appointment if your leg symptoms have progressed since last visit with Dr.  [name].  If any acute changes please follow-up with me sooner or let me know if I can help further.  If there are other concerns we did not have a chance to discuss today, please schedule separate visit.  Thank you for coming in today and take care!          Signed,   Merri Ray, MD [place] 11/08/21 7:31 PM

## 2021-11-08 ENCOUNTER — Encounter: Payer: Self-pay | Admitting: Family Medicine

## 2021-11-13 ENCOUNTER — Encounter: Payer: Self-pay | Admitting: Family Medicine

## 2021-11-13 DIAGNOSIS — M25511 Pain in right shoulder: Secondary | ICD-10-CM | POA: Diagnosis not present

## 2021-11-13 NOTE — Telephone Encounter (Signed)
Pt sending FYI: ?Saw Dr [name] per recommendation was given a Cortison injection to the Rt Bicep(?)  ?Xrays performed as well, I will send a request for Dr [name] report.  ?

## 2021-12-04 ENCOUNTER — Other Ambulatory Visit: Payer: Self-pay | Admitting: Family Medicine

## 2021-12-04 DIAGNOSIS — S39012A Strain of muscle, fascia and tendon of lower back, initial encounter: Secondary | ICD-10-CM

## 2021-12-04 NOTE — Telephone Encounter (Signed)
Patient is requesting a refill of the following medications: ?Requested Prescriptions  ? ?Pending Prescriptions Disp Refills  ? meloxicam (MOBIC) 7.5 MG tablet [Pharmacy Med Name: MELOXICAM 7.5 MG TABLET] 30 tablet 0  ?  Sig: TAKE 1 TABLET BY MOUTH EVERY DAY  ? ? ?Date of patient request: 12/03/21 ?Last office visit: 11/07/21 ?Date of last refill: 11/07/21 ?Last refill amount: 30 ? ?

## 2021-12-07 ENCOUNTER — Encounter: Payer: Self-pay | Admitting: Family Medicine

## 2022-01-01 ENCOUNTER — Other Ambulatory Visit: Payer: Self-pay | Admitting: Family Medicine

## 2022-01-01 DIAGNOSIS — S39012A Strain of muscle, fascia and tendon of lower back, initial encounter: Secondary | ICD-10-CM

## 2022-01-30 ENCOUNTER — Other Ambulatory Visit: Payer: Self-pay | Admitting: Family Medicine

## 2022-01-30 DIAGNOSIS — S39012A Strain of muscle, fascia and tendon of lower back, initial encounter: Secondary | ICD-10-CM

## 2022-01-30 NOTE — Telephone Encounter (Signed)
Patient is requesting a refill of the following medications: Requested Prescriptions   Pending Prescriptions Disp Refills   meloxicam (MOBIC) 7.5 MG tablet [Pharmacy Med Name: MELOXICAM 7.5 MG TABLET] 30 tablet 0    Sig: TAKE 1 TABLET BY MOUTH EVERY DAY    Date of patient request: 01/30/22 Last office visit: 11/07/21 Date of last refill: 01/01/22 Last refill amount: 30

## 2022-03-06 DIAGNOSIS — C61 Malignant neoplasm of prostate: Secondary | ICD-10-CM | POA: Diagnosis not present

## 2022-03-13 DIAGNOSIS — N3946 Mixed incontinence: Secondary | ICD-10-CM | POA: Diagnosis not present

## 2022-03-13 DIAGNOSIS — C61 Malignant neoplasm of prostate: Secondary | ICD-10-CM | POA: Diagnosis not present

## 2022-03-21 ENCOUNTER — Ambulatory Visit (INDEPENDENT_AMBULATORY_CARE_PROVIDER_SITE_OTHER): Payer: Medicare HMO

## 2022-03-21 DIAGNOSIS — Z0000 Encounter for general adult medical examination without abnormal findings: Secondary | ICD-10-CM

## 2022-03-21 NOTE — Patient Instructions (Signed)
Travis Ellis , Thank you for taking time to come for your Medicare Wellness Visit. I appreciate your ongoing commitment to your health goals. Please review the following plan we discussed and let me know if I can assist you in the future.   Screening recommendations/referrals: Colonoscopy: 08/02/2021 Recommended yearly ophthalmology/optometry visit for glaucoma screening and checkup Recommended yearly dental visit for hygiene and checkup  Vaccinations: Influenza vaccine: completed  Pneumococcal vaccine: due  Tdap vaccine: 05/10/2015 Shingles vaccine: will consider     Advanced directives: none   Conditions/risks identified: none   Next appointment: none   Preventive Care 68 Years and Older, Male Preventive care refers to lifestyle choices and visits with your health care provider that can promote health and wellness. What does preventive care include? A yearly physical exam. This is also called an annual well check. Dental exams once or twice a year. Routine eye exams. Ask your health care provider how often you should have your eyes checked. Personal lifestyle choices, including: Daily care of your teeth and gums. Regular physical activity. Eating a healthy diet. Avoiding tobacco and drug use. Limiting alcohol use. Practicing safe sex. Taking low doses of aspirin every day. Taking vitamin and mineral supplements as recommended by your health care provider. What happens during an annual well check? The services and screenings done by your health care provider during your annual well check will depend on your age, overall health, lifestyle risk factors, and family history of disease. Counseling  Your health care provider may ask you questions about your: Alcohol use. Tobacco use. Drug use. Emotional well-being. Home and relationship well-being. Sexual activity. Eating habits. History of falls. Memory and ability to understand (cognition). Work and work  Statistician. Screening  You may have the following tests or measurements: Height, weight, and BMI. Blood pressure. Lipid and cholesterol levels. These may be checked every 5 years, or more frequently if you are over 64 years old. Skin check. Lung cancer screening. You may have this screening every year starting at age 4 if you have a 30-pack-year history of smoking and currently smoke or have quit within the past 15 years. Fecal occult blood test (FOBT) of the stool. You may have this test every year starting at age 62. Flexible sigmoidoscopy or colonoscopy. You may have a sigmoidoscopy every 5 years or a colonoscopy every 10 years starting at age 5. Prostate cancer screening. Recommendations will vary depending on your family history and other risks. Hepatitis C blood test. Hepatitis B blood test. Sexually transmitted disease (STD) testing. Diabetes screening. This is done by checking your blood sugar (glucose) after you have not eaten for a while (fasting). You may have this done every 1-3 years. Abdominal aortic aneurysm (AAA) screening. You may need this if you are a current or former smoker. Osteoporosis. You may be screened starting at age 68 if you are at high risk. Talk with your health care provider about your test results, treatment options, and if necessary, the need for more tests. Vaccines  Your health care provider may recommend certain vaccines, such as: Influenza vaccine. This is recommended every year. Tetanus, diphtheria, and acellular pertussis (Tdap, Td) vaccine. You may need a Td booster every 10 years. Zoster vaccine. You may need this after age 35. Pneumococcal 13-valent conjugate (PCV13) vaccine. One dose is recommended after age 14. Pneumococcal polysaccharide (PPSV23) vaccine. One dose is recommended after age 78. Talk to your health care provider about which screenings and vaccines you need and how often you  need them. This information is not intended to replace  advice given to you by your health care provider. Make sure you discuss any questions you have with your health care provider. Document Released: 09/15/2015 Document Revised: 05/08/2016 Document Reviewed: 06/20/2015 Elsevier Interactive Patient Education  2017 [employer] Prevention in the Home Falls can cause injuries. They can happen to people of all ages. There are many things you can do to make your home safe and to help prevent falls. What can I do on the outside of my home? Regularly fix the edges of walkways and driveways and fix any cracks. Remove anything that might make you trip as you walk through a door, such as a raised step or threshold. Trim any bushes or trees on the path to your home. Use bright outdoor lighting. Clear any walking paths of anything that might make someone trip, such as rocks or tools. Regularly check to see if handrails are loose or broken. Make sure that both sides of any steps have handrails. Any raised decks and porches should have guardrails on the edges. Have any leaves, snow, or ice cleared regularly. Use sand or salt on walking paths during winter. Clean up any spills in your garage right away. This includes oil or grease spills. What can I do in the bathroom? Use night lights. Install grab bars by the toilet and in the tub and shower. Do not use towel bars as grab bars. Use non-skid mats or decals in the tub or shower. If you need to sit down in the shower, use a plastic, non-slip stool. Keep the floor dry. Clean up any water that spills on the floor as soon as it happens. Remove soap buildup in the tub or shower regularly. Attach bath mats securely with double-sided non-slip rug tape. Do not have throw rugs and other things on the floor that can make you trip. What can I do in the bedroom? Use night lights. Make sure that you have a light by your bed that is easy to reach. Do not use any sheets or blankets that are too big for your bed.  They should not hang down onto the floor. Have a firm chair that has side arms. You can use this for support while you get dressed. Do not have throw rugs and other things on the floor that can make you trip. What can I do in the kitchen? Clean up any spills right away. Avoid walking on wet floors. Keep items that you use a lot in easy-to-reach places. If you need to reach something above you, use a strong step stool that has a grab bar. Keep electrical cords out of the way. Do not use floor polish or wax that makes floors slippery. If you must use wax, use non-skid floor wax. Do not have throw rugs and other things on the floor that can make you trip. What can I do with my stairs? Do not leave any items on the stairs. Make sure that there are handrails on both sides of the stairs and use them. Fix handrails that are broken or loose. Make sure that handrails are as long as the stairways. Check any carpeting to make sure that it is firmly attached to the stairs. Fix any carpet that is loose or worn. Avoid having throw rugs at the top or bottom of the stairs. If you do have throw rugs, attach them to the floor with carpet tape. Make sure that you have a light switch  at the top of the stairs and the bottom of the stairs. If you do not have them, ask someone to add them for you. What else can I do to help prevent falls? Wear shoes that: Do not have high heels. Have rubber bottoms. Are comfortable and fit you well. Are closed at the toe. Do not wear sandals. If you use a stepladder: Make sure that it is fully opened. Do not climb a closed stepladder. Make sure that both sides of the stepladder are locked into place. Ask someone to hold it for you, if possible. Clearly mark and make sure that you can see: Any grab bars or handrails. First and last steps. Where the edge of each step is. Use tools that help you move around (mobility aids) if they are needed. These  include: Canes. Walkers. Scooters. Crutches. Turn on the lights when you go into a dark area. Replace any light bulbs as soon as they burn out. Set up your furniture so you have a clear path. Avoid moving your furniture around. If any of your floors are uneven, fix them. If there are any pets around you, be aware of where they are. Review your medicines with your doctor. Some medicines can make you feel dizzy. This can increase your chance of falling. Ask your doctor what other things that you can do to help prevent falls. This information is not intended to replace advice given to you by your health care provider. Make sure you discuss any questions you have with your health care provider. Document Released: 06/15/2009 Document Revised: 01/25/2016 Document Reviewed: 09/23/2014 Elsevier Interactive Patient Education  2017 [employer].

## 2022-03-21 NOTE — Progress Notes (Signed)
Subjective:   Travis Ellis is a 66 y.o. male who presents for an Subsequent  Medicare Annual Wellness Visit.   I connected with Travis Ellis  today by telephone and verified that I am speaking with the correct person using two identifiers. Location patient: home Location provider: work Persons participating in the virtual visit: patient, provider.   I discussed the limitations, risks, security and privacy concerns of performing an evaluation and management service by telephone and the availability of in person appointments. I also discussed with the patient that there may be a patient responsible charge related to this service. The patient expressed understanding and verbally consented to this telephonic visit.    Interactive audio and video telecommunications were attempted between this provider and patient, however failed, due to patient having technical difficulties OR patient did not have access to video capability.  We continued and completed visit with audio only.    Review of Systems     Cardiac Risk Factors include: advanced age (>5mn, >>58women);male gender     Objective:    Today's Vitals   There is no height or weight on file to calculate BMI.     03/21/2022    8:46 AM 02/26/2021    3:55 PM 11/08/2020    2:48 PM 08/07/2020    2:48 PM 10/20/2019    9:24 AM 03/14/2016    9:38 AM 06/15/2015    9:44 AM  Advanced Directives  Does Patient Have a Medical Advance Directive? No No No No No No No  Would patient like information on creating a medical advance directive? No - Patient declined No - Patient declined Yes (Inpatient - patient defers creating a medical advance directive at this time - Information given)  No - Patient declined No - patient declined information No - patient declined information    Current Medications (verified) Outpatient Encounter Medications as of 03/21/2022  Medication Sig   aspirin EC 81 MG tablet Take 1 tablet (81 mg total) by mouth daily.    losartan (COZAAR) 50 MG tablet Take 1 tablet (50 mg total) by mouth daily.   meloxicam (MOBIC) 7.5 MG tablet TAKE 1 TABLET BY MOUTH EVERY DAY   Multiple Vitamin (MULTIVITAMIN) tablet Take 1 tablet by mouth daily.   simvastatin (ZOCOR) 20 MG tablet TAKE 1 TABLET BY MOUTH EVERYDAY AT BEDTIME   Facility-Administered Encounter Medications as of 03/21/2022  Medication   0.9 %  sodium chloride infusion    Allergies (verified) Patient has no known allergies.   History: Past Medical History:  Diagnosis Date   Arthritis    generalized   GERD (gastroesophageal reflux disease)    hx of   Hyperlipidemia    on meds   Hypertension    on meds   Neuromuscular disorder ([place]    Peripheral neuropathy    RIGHT sided foot drop   Prostate cancer ([place]    [id number] adenocarcinoma of the prostate   Seasonal allergies    Past Surgical History:  Procedure Laterality Date   CHOLECYSTECTOMY N/A 03/16/2015   Procedure: LAPAROSCOPIC CHOLECYSTECTOMY WITH INTRAOPERATIVE CHOLANGIOGRAM;  Surgeon: MJohnathan Hausen MD;  Location: [facility];  Service: General;  Laterality: N/A;   COLONOSCOPY  2012   JP-movi(exc)   FOOT SURGERY Right 1974   stepped on piece of glass- removed glass particles   REPLACEMENT TOTAL KNEE Right 2010   ROTATOR CUFF REPAIR Left 06/21/2015   Family History  Problem Relation Age of Onset   Breast cancer Mother  Cancer Mother        skin   Heart disease Father        heart attack   COPD Father        smoker   Hypertension Father    Heart disease Paternal Grandfather    Hyperlipidemia Paternal Grandfather    Colon polyps Neg Hx    Colon cancer Neg Hx    Esophageal cancer Neg Hx    Rectal cancer Neg Hx    Stomach cancer Neg Hx    Social History   Socioeconomic History   Marital status: Married    Spouse name: karen   Number of children: Not on file   Years of education: Not on file   Highest education level: Not on file  Occupational History   Occupation: retired   Tobacco Use   Smoking status: Never   Smokeless tobacco: Never  Vaping Use   Vaping Use: Never used  Substance and Sexual Activity   Alcohol use: Yes    Comment: monthly use   Drug use: No   Sexual activity: Yes  Other Topics Concern   Not on file  Social History Narrative   Lives with spouse   Right Handed   Drinks 4 cups caffeine   Social Determinants of Health   Financial Resource Strain: Low Risk  (03/21/2022)   Overall Financial Resource Strain (CARDIA)    Difficulty of Paying Living Expenses: Not hard at all  Food Insecurity: No Food Insecurity (03/21/2022)   Hunger Vital Sign    Worried About Running Out of Food in the Last Year: Never true    Ran Out of Food in the Last Year: Never true  Transportation Needs: No Transportation Needs (03/21/2022)   PRAPARE - Hydrologist (Medical): No    Lack of Transportation (Non-Medical): No  Physical Activity: Insufficiently Active (03/21/2022)   Exercise Vital Sign    Days of Exercise per Week: 3 days    Minutes of Exercise per Session: 30 min  Stress: No Stress Concern Present (03/21/2022)   [place]    Feeling of Stress : Not at all  Social Connections: [place] (03/21/2022)   Social Connection and Isolation Panel [NHANES]    Frequency of Communication with Friends and Family: Three times a week    Frequency of Social Gatherings with Friends and Family: Three times a week    Attends Religious Services: More than 4 times per year    Active Member of Clubs or Organizations: Yes    Attends Archivist Meetings: 1 to 4 times per year    Marital Status: Married    Tobacco Counseling Counseling given: Not Answered   Clinical Intake:  Pre-visit preparation completed: Yes  Pain : No/denies pain     Nutritional Risks: None Diabetes: No  How often do you need to have someone help you when you read  instructions, pamphlets, or other written materials from your doctor or pharmacy?: 1 - Never What is the last grade level you completed in school?: BS  Diabetic?no   Interpreter Needed?: No  Information entered by :: L.Wilson,LPN   Activities of Daily Living    03/21/2022    8:51 AM  In your present state of health, do you have any difficulty performing the following activities:  Hearing? 0  Vision? 0  Difficulty concentrating or making decisions? 0  Walking or climbing stairs? 0  Dressing or  bathing? 0  Doing errands, shopping? 0  Preparing Food and eating ? N  Using the Toilet? N  In the past six months, have you accidently leaked urine? N  Do you have problems with loss of bowel control? N  Managing your Medications? N  Managing your Finances? N  Housekeeping or managing your Housekeeping? N    Patient Care Team: Wendie Agreste, MD as PCP - General (Family Medicine)  Indicate any recent Medical Services you may have received from other than [facility] providers in the past year (date may be approximate).     Assessment:   This is a routine wellness examination for Travis Ellis.  Hearing/Vision screen Vision Screening - Comments:: Annual eye exams wears glasses   Dietary issues and exercise activities discussed: Current Exercise Habits: Home exercise routine, Time (Minutes): 30, Frequency (Times/Week): 3, Weekly Exercise (Minutes/Week): 90, Intensity: Mild, Exercise limited by: None identified   Goals Addressed   None    Depression Screen    03/21/2022    8:47 AM 03/21/2022    8:45 AM 11/07/2021   10:39 AM 05/09/2021    8:38 AM 02/26/2021    4:04 PM 11/08/2020    2:32 PM 10/20/2019    9:20 AM  PHQ 2/9 Scores  PHQ - 2 Score 0 0 0 0 0 0 0    Fall Risk    03/21/2022    8:47 AM 11/07/2021   10:38 AM 05/09/2021    8:38 AM 02/26/2021    3:57 PM 11/08/2020    2:33 PM  Fall Risk   Falls in the past year? 0 1 0 0 0  Number falls in past yr: 0 0 0 0 0  Injury with Fall? 0 0 0 0 0   Risk for fall due to :  History of fall(s);No Fall Risks No Fall Risks    Risk for fall due to: Comment uses cane      Follow up Education provided;Falls evaluation completed Falls evaluation completed Falls evaluation completed Falls evaluation completed;Falls prevention discussed Falls evaluation completed    FALL RISK PREVENTION PERTAINING TO THE HOME:  Any stairs in or around the home? Yes  If so, are there any without handrails? No  Home free of loose throw rugs in walkways, pet beds, electrical cords, etc? Yes  Adequate lighting in your home to reduce risk of falls? Yes   ASSISTIVE DEVICES UTILIZED TO PREVENT FALLS:  Life alert? No  Use of a cane, walker or w/c? Yes  Grab bars in the bathroom? Yes  Shower chair or bench in shower? No  Elevated toilet seat or a handicapped toilet? No     Cognitive Function:    Normal cognitive status assessed by telephone conversation by this Nurse Health Advisor. No abnormalities found. '    11/08/2020    2:39 PM 10/20/2019    9:20 AM 09/07/2018    8:44 AM  6CIT Screen  What Year? 0 points 0 points 0 points  What month? 0 points 0 points 0 points  What time? 0 points 0 points 0 points  Count back from 20 0 points 0 points 0 points  Months in reverse 0 points 0 points 0 points  Repeat phrase 0 points 0 points 0 points  Total Score 0 points 0 points 0 points    Immunizations Immunization History  Administered Date(s) Administered   Fluad Quad(high Dose 65+) 06/01/2021   Influenza Inj Mdck Quad Pf 08/27/2017, 05/27/2018   Influenza Split 09/06/2015  Influenza, High Dose Seasonal PF 06/13/2020   Influenza-Unspecified 05/27/2018, 06/01/2021   PFIZER(Purple Top)SARS-COV-2 Vaccination 10/19/2019, 10/30/2019   Tdap 05/10/2015    TDAP status: Up to date  Flu Vaccine status: Up to date  Pneumococcal vaccine status: Due, Education has been provided regarding the importance of this vaccine. Advised may receive this vaccine at local  pharmacy or Health Dept. Aware to provide a copy of the vaccination record if obtained from local pharmacy or Health Dept. Verbalized acceptance and understanding.  Covid-19 vaccine status: Completed vaccines  Qualifies for Shingles Vaccine? Yes   Zostavax completed No   Shingrix Completed?: No.    Education has been provided regarding the importance of this vaccine. Patient has been advised to call insurance company to determine out of pocket expense if they have not yet received this vaccine. Advised may also receive vaccine at local pharmacy or Health Dept. Verbalized acceptance and understanding.  Screening Tests Health Maintenance  Topic Date Due   Zoster Vaccines- Shingrix (1 of 2) Never done   Pneumonia Vaccine 80+ Years old (1 - PCV) Never done   COVID-19 Vaccine (3 - Pfizer risk series) 11/27/2019   INFLUENZA VACCINE  04/02/2022   TETANUS/TDAP  05/09/2025   COLONOSCOPY (Pts 45-25yr Insurance coverage will need to be confirmed)  08/03/2031   Hepatitis C Screening  Completed   HPV VACCINES  Aged Out    Health Maintenance  Health Maintenance Due  Topic Date Due   Zoster Vaccines- Shingrix (1 of 2) Never done   Pneumonia Vaccine 68 Years old (1 - PCV) Never done   COVID-19 Vaccine (3 - Pfizer risk series) 11/27/2019    Colorectal cancer screening: Type of screening: Colonoscopy. Completed 08/02/2021. Repeat every 10 years  Lung Cancer Screening: (Low Dose CT Chest recommended if Age 68-80years, 30 pack-year currently smoking OR have quit w/in 15years.) does not qualify.   Lung Cancer Screening Referral: n/a  Additional Screening:  Hepatitis C Screening: does not qualify;   Vision Screening: Recommended annual ophthalmology exams for early detection of glaucoma and other disorders of the eye. Is the patient up to date with their annual eye exam?  Yes  Who is the provider or what is the name of the office in which the patient attends annual eye exams? Dr.Roth  If pt  is not established with a provider, would they like to be referred to a provider to establish care? No .   Dental Screening: Recommended annual dental exams for proper oral hygiene  Community Resource Referral / Chronic Care Management: CRR required this visit?  No   CCM required this visit?  No      Plan:     I have personally reviewed and noted the following in the patient's chart:   Medical and social history Use of alcohol, tobacco or illicit drugs  Current medications and supplements including opioid prescriptions. Patient is not currently taking opioid prescriptions. Functional ability and status Nutritional status Physical activity Advanced directives List of other physicians Hospitalizations, surgeries, and ER visits in previous 12 months Vitals Screenings to include cognitive, depression, and falls Referrals and appointments  In addition, I have reviewed and discussed with patient certain preventive protocols, quality metrics, and best practice recommendations. A written personalized care plan for preventive services as well as general preventive health recommendations were provided to patient.     LRandel Pigg LPN   [id number]  Nurse Notes: none

## 2022-04-01 ENCOUNTER — Ambulatory Visit (INDEPENDENT_AMBULATORY_CARE_PROVIDER_SITE_OTHER): Payer: Medicare HMO | Admitting: Family Medicine

## 2022-04-01 VITALS — BP 134/76 | HR 98 | Temp 98.0°F | Resp 16 | Ht 72.0 in | Wt 214.0 lb

## 2022-04-01 DIAGNOSIS — Z0000 Encounter for general adult medical examination without abnormal findings: Secondary | ICD-10-CM

## 2022-04-01 DIAGNOSIS — E785 Hyperlipidemia, unspecified: Secondary | ICD-10-CM | POA: Diagnosis not present

## 2022-04-01 DIAGNOSIS — M25511 Pain in right shoulder: Secondary | ICD-10-CM

## 2022-04-01 DIAGNOSIS — Z87898 Personal history of other specified conditions: Secondary | ICD-10-CM | POA: Diagnosis not present

## 2022-04-01 DIAGNOSIS — I10 Essential (primary) hypertension: Secondary | ICD-10-CM | POA: Diagnosis not present

## 2022-04-01 DIAGNOSIS — S39012A Strain of muscle, fascia and tendon of lower back, initial encounter: Secondary | ICD-10-CM

## 2022-04-01 MED ORDER — MELOXICAM 7.5 MG PO TABS: 7.5000 mg | ORAL_TABLET | Freq: Every day | ORAL | 0 refills | Status: DC

## 2022-04-01 MED ORDER — LOSARTAN POTASSIUM 50 MG PO TABS: 50.0000 mg | ORAL_TABLET | Freq: Every day | ORAL | 1 refills | Status: DC

## 2022-04-01 MED ORDER — SIMVASTATIN 20 MG PO TABS: ORAL_TABLET | ORAL | 1 refills | Status: DC

## 2022-04-01 NOTE — Progress Notes (Signed)
Subjective:  Patient ID: Travis Ellis, male    DOB: [birth year]  Age: 66 y.o. MRN: [id number]  CC:  Chief Complaint  Patient presents with   Annual Exam    Pt discussed Rt bicep pain and was recommended see ortho was given shot and meloxicam worked for a while but now pain has started to return    Form Completion    Pt would like handicap place card renewal     HPI Travis Ellis presents for Annual Exam  Care team: Pcp: me Neuro: [name] - peripheral neuropathy.  Urologist: Tod Persia. Inflamed seb K 07/2021   GI: Henrene Pastor.  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   Needs handicap placard. Using cane for difficulty walking distances and uses cane.   Hypertension: Losartan 50 mg daily. Home readings: rare. Stable when checking.  BP Readings from Last 3 Encounters:  04/01/22 134/76  11/07/21 128/78  08/02/21 111/69   Lab Results  Component Value Date   CREATININE 0.51 11/07/2021   Hyperlipidemia: Simvastatin 20 mg daily, no new myalgias.  Lab Results  Component Value Date   CHOL 142 11/07/2021   HDL 41.50 11/07/2021   LDLCALC 78 11/07/2021   TRIG 111.0 11/07/2021   CHOLHDL 3 11/07/2021   Lab Results  Component Value Date   ALT 43 11/07/2021   AST 32 11/07/2021   ALKPHOS 52 11/07/2021   BILITOT 1.2 11/07/2021    Right shoulder pain Prior prescription for meloxicam, last saw him in March.  Short-term treatment discussed.  Status post treatment by orthopedics for right shoulder pain including injection in March. Pain in r shoulder and bicep. Improved some after cortisone, returned about a month ago.  Occasional meloxicam -   History of prediabetes: A1c has improved from 6.0 in August 2021, 5.7 in March 2022, 5.6 in September of last year. Lab Results  Component Value Date   HGBA1C 5.6 05/09/2021   Wt Readings from Last 3 Encounters:  04/01/22 214 lb (97.1 kg)  11/07/21 209 lb (94.8 kg)  08/02/21  210 lb (95.3 kg)       04/01/2022    3:02 PM 03/21/2022    8:47 AM 03/21/2022    8:45 AM 11/07/2021   10:39 AM 05/09/2021    8:38 AM  Depression screen PHQ 2/9  Decreased Interest 0 0 0 0 0  Down, Depressed, Hopeless 0 0 0 0 0  PHQ - 2 Score 0 0 0 0 0    Health Maintenance  Topic Date Due   Zoster Vaccines- Shingrix (1 of 2) Never done   Pneumonia Vaccine 33+ Years old (1 - PCV) Never done   COVID-19 Vaccine (3 - Pfizer risk series) 11/27/2019   INFLUENZA VACCINE  04/02/2022   TETANUS/TDAP  05/09/2025   COLONOSCOPY (Pts 45-85yr Insurance coverage will need to be confirmed)  08/03/2031   Hepatitis C Screening  Completed   HPV VACCINES  Aged Out   Colonoscopy: 08/02/21, repeat 10 years.  Prostate: hx of prostate CA, surgery 15 years out. followed by urology, recent testing 0 - some persistent incontinence, stable last year. Question of Charcot Marie Tooth.  Followed by derm as needed   Immunization History  Administered Date(s) Administered   Fluad Quad(high Dose 65+) 06/01/2021   Influenza Inj Mdck Quad Pf 08/27/2017, 05/27/2018   Influenza Split 09/06/2015   Influenza, High Dose Seasonal PF 06/13/2020   Influenza-Unspecified 05/27/2018, 06/01/2021   PFIZER(Purple Top)SARS-COV-2 Vaccination  10/19/2019, 10/30/2019   Tdap 05/10/2015  Shingrix: declines - concern about neuropathy issues and will discuss with Dr. [name].  Pneumonia vaccine - defers as above  Covid booster - as above - deferring  No results found. Followed by Dr. Jabier Mutton, appt within a year.  Dental: every 6 months  Alcohol: 1 per month.   Tobacco: none.   Exercise: walking 1000 steps per day. Occasional exercise bike.    History Patient Active Problem List   Diagnosis Date Noted   Left rotator cuff tear 10/22/2018   Risk for coronary artery disease between 10% and 20% in next 10 years 11/27/2016   Prostate cancer ([place]) 09/14/2012   Abnormal gait 05/21/2012   HTN (hypertension) 02/03/2012   Peripheral  neuropathy 02/03/2012   Past Medical History:  Diagnosis Date   Arthritis    generalized   GERD (gastroesophageal reflux disease)    hx of   Hyperlipidemia    on meds   Hypertension    on meds   Neuromuscular disorder (HCC)    Peripheral neuropathy    RIGHT sided foot drop   Prostate cancer ([place])    [id number] adenocarcinoma of the prostate   Seasonal allergies    Past Surgical History:  Procedure Laterality Date   CHOLECYSTECTOMY N/A 03/16/2015   Procedure: LAPAROSCOPIC CHOLECYSTECTOMY WITH INTRAOPERATIVE CHOLANGIOGRAM;  Surgeon: [name], MD;  Location: [facility];  Service: General;  Laterality: N/A;   COLONOSCOPY  2012   JP-movi(exc)   FOOT SURGERY Right 1974   stepped on piece of glass- removed glass particles   REPLACEMENT TOTAL KNEE Right 2010   ROTATOR CUFF REPAIR Left 06/21/2015   No Known Allergies Prior to Admission medications   Medication Sig Start Date End Date Taking? Authorizing Provider  aspirin EC 81 MG tablet Take 1 tablet (81 mg total) by mouth daily. 02/23/18  Yes Tereasa Coop, PA-C  losartan (COZAAR) 50 MG tablet Take 1 tablet (50 mg total) by mouth daily. 11/07/21  Yes Wendie Agreste, MD  meloxicam (MOBIC) 7.5 MG tablet TAKE 1 TABLET BY MOUTH EVERY DAY 01/30/22  Yes Wendie Agreste, MD  Multiple Vitamin (MULTIVITAMIN) tablet Take 1 tablet by mouth daily.   Yes [provider]  simvastatin (ZOCOR) 20 MG tablet TAKE 1 TABLET BY MOUTH EVERYDAY AT BEDTIME 11/07/21  Yes Wendie Agreste, MD   Social History   Socioeconomic History   Marital status: Married    Spouse name: karen   Number of children: Not on file   Years of education: Not on file   Highest education level: Not on file  Occupational History   Occupation: retired  Tobacco Use   Smoking status: Never   Smokeless tobacco: Never  Vaping Use   Vaping Use: Never used  Substance and Sexual Activity   Alcohol use: Yes    Comment: monthly use   Drug use: No   Sexual  activity: Yes  Other Topics Concern   Not on file  Social History Narrative   Lives with spouse   Right Handed   Drinks 4 cups caffeine   Social Determinants of Health   Financial Resource Strain: Low Risk  (03/21/2022)   Overall Financial Resource Strain (CARDIA)    Difficulty of Paying Living Expenses: Not hard at all  Food Insecurity: No Food Insecurity (03/21/2022)   Hunger Vital Sign    Worried About Running Out of Food in the Last Year: Never true    [place] in  the Last Year: Never true  Transportation Needs: No Transportation Needs (03/21/2022)   PRAPARE - Hydrologist (Medical): No    Lack of Transportation (Non-Medical): No  Physical Activity: Insufficiently Active (03/21/2022)   Exercise Vital Sign    Days of Exercise per Week: 3 days    Minutes of Exercise per Session: 30 min  Stress: No Stress Concern Present (03/21/2022)   [place]    Feeling of Stress : Not at all  Social Connections: [place] (03/21/2022)   Social Connection and Isolation Panel [NHANES]    Frequency of Communication with Friends and Family: Three times a week    Frequency of Social Gatherings with Friends and Family: Three times a week    Attends Religious Services: More than 4 times per year    Active Member of Clubs or Organizations: Yes    Attends Archivist Meetings: 1 to 4 times per year    Marital Status: Married  Human resources officer Violence: Not At Risk (03/21/2022)   Humiliation, Afraid, Rape, and Kick questionnaire    Fear of Current or Ex-Partner: No    Emotionally Abused: No    Physically Abused: No    Sexually Abused: No    Review of Systems 13 point review of systems per patient health survey noted.  Negative other than as indicated above or in HPI.    Objective:   Vitals:   04/01/22 1453  BP: 134/76  Pulse: 98  Resp: 16  Temp: 98 F (36.7 C)  TempSrc:  Oral  SpO2: 96%  Weight: 214 lb (97.1 kg)  Height: 6' (1.829 m)     Physical Exam Vitals reviewed.  Constitutional:      Appearance: He is well-developed.  HENT:     Head: Normocephalic and atraumatic.     Right Ear: External ear normal.     Left Ear: External ear normal.  Eyes:     Conjunctiva/sclera: Conjunctivae normal.     Pupils: Pupils are equal, round, and reactive to light.  Neck:     Thyroid: No thyromegaly.  Cardiovascular:     Rate and Rhythm: Normal rate and regular rhythm.     Heart sounds: Normal heart sounds.  Pulmonary:     Effort: Pulmonary effort is normal. No respiratory distress.     Breath sounds: Normal breath sounds. No wheezing.  Abdominal:     General: There is no distension.     Palpations: Abdomen is soft.     Tenderness: There is no abdominal tenderness.  Musculoskeletal:        General: No tenderness.     Cervical back: Normal range of motion and neck supple.     Comments: Atrophy of LE musculature.   Lymphadenopathy:     Cervical: No cervical adenopathy.  Skin:    General: Skin is warm and dry.  Neurological:     Mental Status: He is alert and oriented to person, place, and time.     Deep Tendon Reflexes: Reflexes are normal and symmetric.  Psychiatric:        Behavior: Behavior normal.     Assessment & Plan:  Travis Ellis is a 66 y.o. male . Annual physical exam  - -anticipatory guidance as below in AVS, screening labs above. Health maintenance items as above in HPI discussed/recommended as applicable.   Dyslipidemia - Plan: simvastatin (ZOCOR) 20 MG tablet  -  Stable, tolerating  current regimen. Medications refilled. Labs pending as above.   Essential hypertension - Plan: losartan (COZAAR) 50 MG tablet  -  Stable, tolerating current regimen. Medications refilled. Labs pending as above.   History of prediabetes - Plan: POCT glycosylated hemoglobin (Hb A1C)  Right shoulder pain Some improvement initially after injection  with orthopedics, recommended follow-up as some recurrence of symptoms including some pain into his upper arm, but reports similar symptoms previously.  Short-term meloxicam prescribed in the interim.  Meds ordered this encounter  Medications   meloxicam (MOBIC) 7.5 MG tablet    Sig: Take 1 tablet (7.5 mg total) by mouth daily.    Dispense:  30 tablet    Refill:  0   simvastatin (ZOCOR) 20 MG tablet    Sig: TAKE 1 TABLET BY MOUTH EVERYDAY AT BEDTIME    Dispense:  90 tablet    Refill:  1   losartan (COZAAR) 50 MG tablet    Sig: Take 1 tablet (50 mg total) by mouth daily.    Dispense:  90 tablet    Refill:  1   Patient Instructions  Call ortho for follow up on shoulder/arm. Temporary mobic refilled for now. No medication changes at this time.  Follow-up in 6 months - can repeat labs at that time.  Let me know if there are any questions during that time.  Take care!  Preventive Care 54 Years and Older, Male Preventive care refers to lifestyle choices and visits with your health care provider that can promote health and wellness. Preventive care visits are also called wellness exams. What can I expect for my preventive care visit? Counseling During your preventive care visit, your health care provider may ask about your: Medical history, including: Past medical problems. Family medical history. History of falls. Current health, including: Emotional well-being. Home life and relationship well-being. Sexual activity. Memory and ability to understand (cognition). Lifestyle, including: Alcohol, nicotine or tobacco, and drug use. Access to firearms. Diet, exercise, and sleep habits. Work and work Statistician. Sunscreen use. Safety issues such as seatbelt and bike helmet use. Physical exam Your health care provider will check your: Height and weight. These may be used to calculate your BMI (body mass index). BMI is a measurement that tells if you are at a healthy weight. Waist  circumference. This measures the distance around your waistline. This measurement also tells if you are at a healthy weight and may help predict your risk of certain diseases, such as type 2 diabetes and high blood pressure. Heart rate and blood pressure. Body temperature. Skin for abnormal spots. What immunizations do I need?  Vaccines are usually given at various ages, according to a schedule. Your health care provider will recommend vaccines for you based on your age, medical history, and lifestyle or other factors, such as travel or where you work. What tests do I need? Screening Your health care provider may recommend screening tests for certain conditions. This may include: Lipid and cholesterol levels. Diabetes screening. This is done by checking your blood sugar (glucose) after you have not eaten for a while (fasting). Hepatitis C test. Hepatitis B test. HIV (human immunodeficiency virus) test. STI (sexually transmitted infection) testing, if you are at risk. Lung cancer screening. Colorectal cancer screening. Prostate cancer screening. Abdominal aortic aneurysm (AAA) screening. You may need this if you are a current or former smoker. Talk with your health care provider about your test results, treatment options, and if necessary, the need for more tests. Follow these  instructions at home: Eating and drinking  Eat a diet that includes fresh fruits and vegetables, whole grains, lean protein, and low-fat dairy products. Limit your intake of foods with high amounts of sugar, saturated fats, and salt. Take vitamin and mineral supplements as recommended by your health care provider. Do not drink alcohol if your health care provider tells you not to drink. If you drink alcohol: Limit how much you have to 0-2 drinks a day. Know how much alcohol is in your drink. In the [place] one drink equals one 12 oz bottle of beer (355 mL), one 5 oz glass of wine (148 mL), or one 1 oz glass of hard  liquor (44 mL). Lifestyle Brush your teeth every morning and night with fluoride toothpaste. Floss one time each day. Exercise for at least 30 minutes 5 or more days each week. Do not use any products that contain nicotine or tobacco. These products include cigarettes, chewing tobacco, and vaping devices, such as e-cigarettes. If you need help quitting, ask your health care provider. Do not use drugs. If you are sexually active, practice safe sex. Use a condom or other form of protection to prevent STIs. Take aspirin only as told by your health care provider. Make sure that you understand how much to take and what form to take. Work with your health care provider to find out whether it is safe and beneficial for you to take aspirin daily. Ask your health care provider if you need to take a cholesterol-lowering medicine (statin). Find healthy ways to manage stress, such as: Meditation, yoga, or listening to music. Journaling. Talking to a trusted person. Spending time with friends and family. Safety Always wear your seat belt while driving or riding in a vehicle. Do not drive: If you have been drinking alcohol. Do not ride with someone who has been drinking. When you are tired or distracted. While texting. If you have been using any mind-altering substances or drugs. Wear a helmet and other protective equipment during sports activities. If you have firearms in your house, make sure you follow all gun safety procedures. Minimize exposure to UV radiation to reduce your risk of skin cancer. What's next? Visit your health care provider once a year for an annual wellness visit. Ask your health care provider how often you should have your eyes and teeth checked. Stay up to date on all vaccines. This information is not intended to replace advice given to you by your health care provider. Make sure you discuss any questions you have with your health care provider. Document Revised: 02/14/2021  Document Reviewed: 02/14/2021 Elsevier Patient Education  [place],   Merri Ray, MD [place], [facility] 04/01/22 3:19 PM

## 2022-04-01 NOTE — Patient Instructions (Addendum)
Call ortho for follow up on shoulder/arm. Temporary mobic refilled for now. No medication changes at this time.  Follow-up in 6 months - can repeat labs at that time.  Let me know if there are any questions during that time.  Take care!  Preventive Care 24 Years and Older, Male Preventive care refers to lifestyle choices and visits with your health care provider that can promote health and wellness. Preventive care visits are also called wellness exams. What can I expect for my preventive care visit? Counseling During your preventive care visit, your health care provider may ask about your: Medical history, including: Past medical problems. Family medical history. History of falls. Current health, including: Emotional well-being. Home life and relationship well-being. Sexual activity. Memory and ability to understand (cognition). Lifestyle, including: Alcohol, nicotine or tobacco, and drug use. Access to firearms. Diet, exercise, and sleep habits. Work and work Statistician. Sunscreen use. Safety issues such as seatbelt and bike helmet use. Physical exam Your health care provider will check your: Height and weight. These may be used to calculate your BMI (body mass index). BMI is a measurement that tells if you are at a healthy weight. Waist circumference. This measures the distance around your waistline. This measurement also tells if you are at a healthy weight and may help predict your risk of certain diseases, such as type 2 diabetes and high blood pressure. Heart rate and blood pressure. Body temperature. Skin for abnormal spots. What immunizations do I need?  Vaccines are usually given at various ages, according to a schedule. Your health care provider will recommend vaccines for you based on your age, medical history, and lifestyle or other factors, such as travel or where you work. What tests do I need? Screening Your health care provider may recommend screening tests for  certain conditions. This may include: Lipid and cholesterol levels. Diabetes screening. This is done by checking your blood sugar (glucose) after you have not eaten for a while (fasting). Hepatitis C test. Hepatitis B test. HIV (human immunodeficiency virus) test. STI (sexually transmitted infection) testing, if you are at risk. Lung cancer screening. Colorectal cancer screening. Prostate cancer screening. Abdominal aortic aneurysm (AAA) screening. You may need this if you are a current or former smoker. Talk with your health care provider about your test results, treatment options, and if necessary, the need for more tests. Follow these instructions at home: Eating and drinking  Eat a diet that includes fresh fruits and vegetables, whole grains, lean protein, and low-fat dairy products. Limit your intake of foods with high amounts of sugar, saturated fats, and salt. Take vitamin and mineral supplements as recommended by your health care provider. Do not drink alcohol if your health care provider tells you not to drink. If you drink alcohol: Limit how much you have to 0-2 drinks a day. Know how much alcohol is in your drink. In the [place] one drink equals one 12 oz bottle of beer (355 mL), one 5 oz glass of wine (148 mL), or one 1 oz glass of hard liquor (44 mL). Lifestyle Brush your teeth every morning and night with fluoride toothpaste. Floss one time each day. Exercise for at least 30 minutes 5 or more days each week. Do not use any products that contain nicotine or tobacco. These products include cigarettes, chewing tobacco, and vaping devices, such as e-cigarettes. If you need help quitting, ask your health care provider. Do not use drugs. If you are sexually active, practice safe sex. Use a  condom or other form of protection to prevent STIs. Take aspirin only as told by your health care provider. Make sure that you understand how much to take and what form to take. Work with your  health care provider to find out whether it is safe and beneficial for you to take aspirin daily. Ask your health care provider if you need to take a cholesterol-lowering medicine (statin). Find healthy ways to manage stress, such as: Meditation, yoga, or listening to music. Journaling. Talking to a trusted person. Spending time with friends and family. Safety Always wear your seat belt while driving or riding in a vehicle. Do not drive: If you have been drinking alcohol. Do not ride with someone who has been drinking. When you are tired or distracted. While texting. If you have been using any mind-altering substances or drugs. Wear a helmet and other protective equipment during sports activities. If you have firearms in your house, make sure you follow all gun safety procedures. Minimize exposure to UV radiation to reduce your risk of skin cancer. What's next? Visit your health care provider once a year for an annual wellness visit. Ask your health care provider how often you should have your eyes and teeth checked. Stay up to date on all vaccines. This information is not intended to replace advice given to you by your health care provider. Make sure you discuss any questions you have with your health care provider. Document Revised: 02/14/2021 Document Reviewed: 02/14/2021 Elsevier Patient Education  [place].

## 2022-04-02 ENCOUNTER — Encounter: Payer: Self-pay | Admitting: Family Medicine

## 2022-04-09 DIAGNOSIS — H52223 Regular astigmatism, bilateral: Secondary | ICD-10-CM | POA: Diagnosis not present

## 2022-04-09 DIAGNOSIS — H524 Presbyopia: Secondary | ICD-10-CM | POA: Diagnosis not present

## 2022-04-09 DIAGNOSIS — H5203 Hypermetropia, bilateral: Secondary | ICD-10-CM | POA: Diagnosis not present

## 2022-05-10 ENCOUNTER — Encounter: Payer: Medicare HMO | Admitting: Family Medicine

## 2022-05-13 NOTE — Addendum Note (Signed)
Addended by: Karle Barr on: 05/13/2022 08:44 AM   Modules accepted: Level of Service

## 2022-05-16 ENCOUNTER — Ambulatory Visit: Payer: Medicare HMO | Admitting: Neurology

## 2022-05-16 ENCOUNTER — Telehealth: Payer: Self-pay

## 2022-05-16 ENCOUNTER — Telehealth: Payer: Self-pay | Admitting: Neurology

## 2022-05-16 VITALS — BP 128/72 | HR 78 | Ht 72.0 in | Wt 214.0 lb

## 2022-05-16 DIAGNOSIS — E559 Vitamin D deficiency, unspecified: Secondary | ICD-10-CM | POA: Diagnosis not present

## 2022-05-16 DIAGNOSIS — R269 Unspecified abnormalities of gait and mobility: Secondary | ICD-10-CM

## 2022-05-16 DIAGNOSIS — R6889 Other general symptoms and signs: Secondary | ICD-10-CM | POA: Diagnosis not present

## 2022-05-16 DIAGNOSIS — R748 Abnormal levels of other serum enzymes: Secondary | ICD-10-CM | POA: Diagnosis not present

## 2022-05-16 DIAGNOSIS — R700 Elevated erythrocyte sedimentation rate: Secondary | ICD-10-CM | POA: Diagnosis not present

## 2022-05-16 DIAGNOSIS — R760 Raised antibody titer: Secondary | ICD-10-CM | POA: Diagnosis not present

## 2022-05-16 DIAGNOSIS — G600 Hereditary motor and sensory neuropathy: Secondary | ICD-10-CM | POA: Diagnosis not present

## 2022-05-16 NOTE — Telephone Encounter (Signed)
[employer] medicare sent to GI they obtain auth  

## 2022-05-16 NOTE — Telephone Encounter (Signed)
[employer] pw has been completed and signed by Dr. [name]. I have sent to intake team via fax # [phone] confirmation received.

## 2022-05-16 NOTE — Progress Notes (Signed)
Chief Complaint  Patient presents with   New Patient (Initial Visit)    Room 15, alone States gait has slowly worsen since last year       Travis Ellis is a 66 y.o. male   Slowly progressive distal weakness, bilateral foot drop, profound gait abnormality, only sensory complaints  On examinations, profound distal weakness, length-dependent sensory changes, brisk knee reflex with absent ankle reflex,  Discussed with patient, despite multiple previous evaluations, with his progressive worsening gait abnormality, he agreed to proceed with  MRI of cervical, thoracic spine  Laboratory evaluation to rule out treatable etiology  [facility] genetic testing  Will call him report, return to clinic in 1 year with nurse practitioner  Encourage him to do ankle brace   DIAGNOSTIC DATA (LABS, IMAGING, TESTING) - I reviewed patient records, labs, notes, testing and imaging myself where available.  Lab in March 2023: normal Lipid Panel, CMP, A1C 5.6, TSH  MEDICAL HISTORY:  Travis Ellis, is a 66 year old male seen in request by his primary care physician Dr. Merri Ray for evaluation of slow worsening gait abnormality, initial evaluation May 16, 2022  I reviewed and summarized the referring note.  Past medical history Hypertension Hyperlipidemia Was a cancer Right knee replacement in 2020 Left rotator cuff repair October 2016  Patient was seen by Dr. [name] since November 2021, he reported gradual onset lower extremity weakness, gait abnormality around 2007,  tripped over a curb, fell, right leg is more obvious, seen by Dr. [name] at that time, later transferred his care to Dr. [name] at [facility],  Reported gradual onset of symptoms around 2007, evaluated by Dr. [name], later transferred to Dr. [name] at [facility], the patient was felt to have a distal Hereditary motor neuropathy,  And reviewed evaluation in 2017 by Dr. [name], MRI of  lumbar reported only mild degenerative changes, slow progressive bilateral lower extremity weakness, also slow worsening atrophy of his legs, right worse than left, denies significant numbness or paresthesia, occasionally fall,  ambulate without assistant device, occasionally uses right AFO, but never able to adapt it well, at that time, he is able to ambulate to 3 miles daily without significant difficulty,  Had a repeat EMG nerve conduction study and ultrasound in August 2017 but I do not have the report  MRI of the lumbar spine at [facility] October 2016, small central to left central disc protrusion at L5-S1 with potential impingement on the traversing left S1 nerve root multilevel mild degenerative changes no significant canal foraminal narrowing  Over the years, he noticed mild bilateral arm weakness, difficulty using left arm for heavy object, increased gait abnormality Urinary urgency, incontinence that is associated his prostate cancer, he had a history of radical prostatectomy, radiation therapy,  No family history of similar disease, he has no children, lives with his wife,  He is still trying to be active, working on a house project,  Slow decline in functional status especially since 2021, rely on shopping cart in the grocery store,  EMG nerve conduction study by Dr. [name] in May 2022: With normal bilateral tibial distal latency, CMAP amplitude, borderline conduction velocity, normal bilateral sural peroneal sensory response, F-wave latency was normal on the left tibial, prolonged on the right side.  Needle examination showed chronic neuropathic changes involving tibialis anterior, peroneal longus,  most recent visit with Dr. [name] in September 2022, given the diagnosis of primary motor neuropathy, also had a history of  bilateral hip arthritis, right worse than left, control of right hip pain with injection has improved his discomfort and strength,  On examination, describe  mild bilateral arm deltoid, external rotation weakness, significant weakness of bilateral lower extremity, prominent bilateral foot drop, right hip flexion 4+, near normal on the left side,  PHYSICAL EXAM:   Vitals:   05/16/22 1325  BP: 128/72  Pulse: 78  Weight: 214 lb (97.1 kg)  Height: 6' (1.829 m)   Not recorded     Body mass index is 29.02 kg/m.  PHYSICAL EXAMNIATION:  Gen: NAD, conversant, well nourised, well groomed                     Cardiovascular: Regular rate rhythm, no peripheral edema, warm, nontender. Eyes: Conjunctivae clear without exudates or hemorrhage Neck: Supple, no carotid bruits. Pulmonary: Clear to auscultation bilaterally   NEUROLOGICAL EXAM:  MENTAL STATUS: Speech/cognition: Awake, alert, oriented to history taking and casual conversation CRANIAL NERVES: CN II: Visual fields are full to confrontation. Pupils are round equal and briskly reactive to light. CN Ellis, IV, VI: extraocular movement are normal. No ptosis. CN V: Facial sensation is intact to light touch CN VII: Mild eye closure, cheek puff weakness CN VIII: Hearing is normal to causal conversation. CN IX, X: Phonation is normal. CN XI: Head turning and shoulder shrug are intact  MOTOR: Limited range of motion of right shoulder due to previous history of right rotator cuff surgery, no significant upper extremity proximal and distal muscle weakness, Mild right hip flexion weakness, did report a history of bilateral hip joint pain, right worse than left, no significant bilateral knee flexion extension weakness,  Muscle atrophy of bilateral leg, profound bilateral ankle dorsiflexion, plantarflexion weakness, 1/5, fairly symmetric,  REFLEXES: Reflexes are 1  and symmetric at the biceps, triceps, 3/3 knees, and absent at ankles. Plantar responses are mute.  SENSORY: Length dependent decreased vibratory sensation, pinprick to mid shin level  COORDINATION: There is no trunk or limb  dysmetria noted.  GAIT/STANCE: Need push-up to get up from seated position, leaning forward, bilateral foot drop, rely on his cane  REVIEW OF SYSTEMS:  Full 14 system review of systems performed and notable only for as above All other review of systems were negative.   ALLERGIES: No Known Allergies  HOME MEDICATIONS: Current Outpatient Medications  Medication Sig Dispense Refill   aspirin EC 81 MG tablet Take 1 tablet (81 mg total) by mouth daily. 365 tablet 0   losartan (COZAAR) 50 MG tablet Take 1 tablet (50 mg total) by mouth daily. 90 tablet 1   Multiple Vitamin (MULTIVITAMIN) tablet Take 1 tablet by mouth daily.     simvastatin (ZOCOR) 20 MG tablet TAKE 1 TABLET BY MOUTH EVERYDAY AT BEDTIME 90 tablet 1   meloxicam (MOBIC) 7.5 MG tablet Take 1 tablet (7.5 mg total) by mouth daily. 30 tablet 0   Current Facility-Administered Medications  Medication Dose Route Frequency Provider Last Rate Last Admin   0.9 %  sodium chloride infusion  500 mL Intravenous Once Irene Shipper, MD        PAST MEDICAL HISTORY: Past Medical History:  Diagnosis Date   Arthritis    generalized   GERD (gastroesophageal reflux disease)    hx of   Hyperlipidemia    on meds   Hypertension    on meds   Neuromuscular disorder ([place])    Peripheral neuropathy    RIGHT sided foot drop   Prostate  cancer ([place])    [id number] adenocarcinoma of the prostate   Seasonal allergies     PAST SURGICAL HISTORY: Past Surgical History:  Procedure Laterality Date   CHOLECYSTECTOMY N/A 03/16/2015   Procedure: LAPAROSCOPIC CHOLECYSTECTOMY WITH INTRAOPERATIVE CHOLANGIOGRAM;  Surgeon: [name], MD;  Location: [facility];  Service: General;  Laterality: N/A;   COLONOSCOPY  2012   JP-movi(exc)   FOOT SURGERY Right 1974   stepped on piece of glass- removed glass particles   REPLACEMENT TOTAL KNEE Right 2010   ROTATOR CUFF REPAIR Left 06/21/2015    FAMILY HISTORY: Family History  Problem Relation Age of Onset    Breast cancer Mother    Cancer Mother        skin   Heart disease Father        heart attack   COPD Father        smoker   Hypertension Father    Heart disease Paternal Grandfather    Hyperlipidemia Paternal Grandfather    Colon polyps Neg Hx    Colon cancer Neg Hx    Esophageal cancer Neg Hx    Rectal cancer Neg Hx    Stomach cancer Neg Hx     SOCIAL HISTORY: Social History   Socioeconomic History   Marital status: Married    Spouse name: karen   Number of children: Not on file   Years of education: Not on file   Highest education level: Not on file  Occupational History   Occupation: retired  Tobacco Use   Smoking status: Never   Smokeless tobacco: Never  Vaping Use   Vaping Use: Never used  Substance and Sexual Activity   Alcohol use: Yes    Comment: monthly use   Drug use: No   Sexual activity: Yes  Other Topics Concern   Not on file  Social History Narrative   Lives with spouse   Right Handed   Drinks 4 cups caffeine   Social Determinants of Health   Financial Resource Strain: Low Risk  (03/21/2022)   Overall Financial Resource Strain (CARDIA)    Difficulty of Paying Living Expenses: Not hard at all  Food Insecurity: No Food Insecurity (03/21/2022)   Hunger Vital Sign    Worried About Running Out of Food in the Last Year: Never true    Ran Out of Food in the Last Year: Never true  Transportation Needs: No Transportation Needs (03/21/2022)   PRAPARE - Hydrologist (Medical): No    Lack of Transportation (Non-Medical): No  Physical Activity: Insufficiently Active (03/21/2022)   Exercise Vital Sign    Days of Exercise per Week: 3 days    Minutes of Exercise per Session: 30 min  Stress: No Stress Concern Present (03/21/2022)   [place]    Feeling of Stress : Not at all  Social Connections: [place] (03/21/2022)   Social Connection and Isolation Panel  [NHANES]    Frequency of Communication with Friends and Family: Three times a week    Frequency of Social Gatherings with Friends and Family: Three times a week    Attends Religious Services: More than 4 times per year    Active Member of Clubs or Organizations: Yes    Attends Archivist Meetings: 1 to 4 times per year    Marital Status: Married  Human resources officer Violence: Not At Risk (03/21/2022)   Humiliation, Afraid, Rape, and Kick questionnaire  Fear of Current or Ex-Partner: No    Emotionally Abused: No    Physically Abused: No    Sexually Abused: No      Marcial Pacas, M.D. Ph.D.  [facility] [address] [place], [place] [address] Ph: [phone] Fax: [phone]  CC:  Wendie Agreste, MD [address] [place],  [place] [address]  Wendie Agreste, MD

## 2022-05-17 ENCOUNTER — Encounter: Payer: Self-pay | Admitting: Neurology

## 2022-05-23 LAB — MULTIPLE MYELOMA PANEL, SERUM
Albumin SerPl Elph-Mcnc: 4.1 g/dL (ref 2.9–4.4)
Albumin/Glob SerPl: 1.6 (ref 0.7–1.7)
Alpha 1: 0.2 g/dL (ref 0.0–0.4)
Alpha2 Glob SerPl Elph-Mcnc: 0.6 g/dL (ref 0.4–1.0)
B-Globulin SerPl Elph-Mcnc: 1 g/dL (ref 0.7–1.3)
Gamma Glob SerPl Elph-Mcnc: 0.7 g/dL (ref 0.4–1.8)
Globulin, Total: 2.6 g/dL (ref 2.2–3.9)
IgA/Immunoglobulin A, Serum: 371 mg/dL (ref 61–437)
IgG (Immunoglobin G), Serum: 965 mg/dL (ref 603–1613)
IgM (Immunoglobulin M), Srm: 47 mg/dL (ref 20–172)
Total Protein: 6.7 g/dL (ref 6.0–8.5)

## 2022-05-23 LAB — CBC WITH DIFFERENTIAL/PLATELET
Basophils Absolute: 0 10*3/uL (ref 0.0–0.2)
Basos: 1 %
EOS (ABSOLUTE): 0.1 10*3/uL (ref 0.0–0.4)
Eos: 1 %
Hematocrit: 53.2 % — ABNORMAL HIGH (ref 37.5–51.0)
Hemoglobin: 17.6 g/dL (ref 13.0–17.7)
Immature Grans (Abs): 0 10*3/uL (ref 0.0–0.1)
Immature Granulocytes: 0 %
Lymphocytes Absolute: 1.2 10*3/uL (ref 0.7–3.1)
Lymphs: 17 %
MCH: 32.7 pg (ref 26.6–33.0)
MCHC: 33.1 g/dL (ref 31.5–35.7)
MCV: 99 fL — ABNORMAL HIGH (ref 79–97)
Monocytes Absolute: 0.7 10*3/uL (ref 0.1–0.9)
Monocytes: 10 %
Neutrophils Absolute: 5.2 10*3/uL (ref 1.4–7.0)
Neutrophils: 71 %
Platelets: 197 10*3/uL (ref 150–450)
RBC: 5.39 x10E6/uL (ref 4.14–5.80)
RDW: 11.8 % (ref 11.6–15.4)
WBC: 7.3 10*3/uL (ref 3.4–10.8)

## 2022-05-23 LAB — ANA W/REFLEX IF POSITIVE: Anti Nuclear Antibody (ANA): NEGATIVE

## 2022-05-23 LAB — VITAMIN B12: Vitamin B-12: 736 pg/mL (ref 232–1245)

## 2022-05-23 LAB — RPR: RPR Ser Ql: NONREACTIVE

## 2022-05-23 LAB — CK: Total CK: 117 U/L (ref 41–331)

## 2022-05-23 LAB — HIV ANTIBODY (ROUTINE TESTING W REFLEX): HIV Screen 4th Generation wRfx: NONREACTIVE

## 2022-05-23 LAB — C-REACTIVE PROTEIN: CRP: <1 mg/L (ref 0–10)

## 2022-05-23 LAB — VITAMIN D 25 HYDROXY (VIT D DEFICIENCY, FRACTURES): Vit D, 25-Hydroxy: 31.9 ng/mL (ref 30.0–100.0)

## 2022-05-23 LAB — SEDIMENTATION RATE: Sed Rate: 8 mm/hr (ref 0–30)

## 2022-05-28 ENCOUNTER — Ambulatory Visit
Admission: RE | Admit: 2022-05-28 | Discharge: 2022-05-28 | Disposition: A | Discharge status: Home / Self Care | Payer: Medicare HMO | Source: Ambulatory Visit | Attending: Neurology | Admitting: Neurology

## 2022-05-28 DIAGNOSIS — R269 Unspecified abnormalities of gait and mobility: Secondary | ICD-10-CM

## 2022-05-29 ENCOUNTER — Other Ambulatory Visit: Payer: Self-pay | Admitting: Family Medicine

## 2022-05-29 DIAGNOSIS — M25511 Pain in right shoulder: Secondary | ICD-10-CM

## 2022-05-29 NOTE — Telephone Encounter (Signed)
Patient is requesting a refill of the following medications: Requested Prescriptions   Pending Prescriptions Disp Refills   meloxicam (MOBIC) 7.5 MG tablet [Pharmacy Med Name: MELOXICAM 7.5 MG TABLET] 30 tablet 0    Sig: TAKE 1 TABLET BY MOUTH EVERY DAY    Date of patient request: 05/29/22 Last office visit: 04/01/22 Date of last refill: 04/01/22 Last refill amount: 30

## 2022-05-30 NOTE — Telephone Encounter (Signed)
Mobic refilled, but planned on short-term use at his July visit.  There are some risks with this medication long-term.  Plan was for him to follow-up with Ortho to discuss treatment options for shoulder.  If he is still requiring this medication daily, needs to discuss with Ortho other options or follow-up to discuss other options.  Thanks.

## 2022-05-31 ENCOUNTER — Telehealth: Payer: Self-pay

## 2022-05-31 ENCOUNTER — Encounter: Payer: Self-pay | Admitting: Neurology

## 2022-05-31 NOTE — Telephone Encounter (Signed)
Pt saw neurology, had imaging done by them notes wanted to wait to see neurology before he saw ortho. Will be scheduling soon  Notes he is not using daily this was an automated request from pharmacy he does not need any more at this time refill will likley get him to his appt with ortho in the future

## 2022-05-31 NOTE — Telephone Encounter (Signed)
Pt returned your phone call

## 2022-05-31 NOTE — Telephone Encounter (Signed)
Please call patient MRI of thoracic spine showed no significant abnormality, MRI of the cervical spine showed multilevel degenerative changes, most noticeable at C5-6, C6-7 level, mild canal stenosis severe bilateral foraminal stenosis  Please give him a follow-up visit with me in 6 months, if he has acute worsening of neck pain, gait abnormality, may call us   IMPRESSION:    Unremarkable MRI thoracic spine without contrast.  No spinal stenosis or foraminal narrowing  IMPRESSION:    MRI cervical spine without contrast demonstrating: - At C6-7: disc bulging and facet hypertrophy with mild to moderate spinal stenosis and severe bilateral foraminal stenosis. - At C5-6: disc bulging and facet vertebrae with mild spinal stenosis and severe bilateral foraminal stenosis. - At C2: mild spinal stenosis due to posterior element osteophytes.

## 2022-05-31 NOTE — Telephone Encounter (Signed)
Noted, thanks!

## 2022-05-31 NOTE — Telephone Encounter (Signed)
Mobic refilled, but planned on short-term use at his July visit.  There are some risks with this medication long-term.  Plan was for him to follow-up with Ortho to discuss treatment options for shoulder.  If he is still requiring this medication daily, needs to discuss with Ortho other options or follow-up to discuss other options.  Thanks.

## 2022-06-04 NOTE — Telephone Encounter (Signed)
Pt scheduled with Dr. [name] for 12/10/2022 at 3:00pm

## 2022-07-16 ENCOUNTER — Other Ambulatory Visit: Payer: Self-pay | Admitting: Family Medicine

## 2022-07-16 DIAGNOSIS — M25511 Pain in right shoulder: Secondary | ICD-10-CM

## 2022-10-03 ENCOUNTER — Encounter: Payer: Self-pay | Admitting: Family Medicine

## 2022-10-03 ENCOUNTER — Ambulatory Visit (INDEPENDENT_AMBULATORY_CARE_PROVIDER_SITE_OTHER): Payer: Medicare HMO | Admitting: Family Medicine

## 2022-10-03 VITALS — BP 118/78 | HR 93 | Temp 97.8°F | Ht 72.0 in | Wt 213.0 lb

## 2022-10-03 DIAGNOSIS — R269 Unspecified abnormalities of gait and mobility: Secondary | ICD-10-CM | POA: Diagnosis not present

## 2022-10-03 DIAGNOSIS — E785 Hyperlipidemia, unspecified: Secondary | ICD-10-CM | POA: Diagnosis not present

## 2022-10-03 DIAGNOSIS — Z87898 Personal history of other specified conditions: Secondary | ICD-10-CM | POA: Diagnosis not present

## 2022-10-03 DIAGNOSIS — G6289 Other specified polyneuropathies: Secondary | ICD-10-CM

## 2022-10-03 DIAGNOSIS — I10 Essential (primary) hypertension: Secondary | ICD-10-CM

## 2022-10-03 MED ORDER — SIMVASTATIN 20 MG PO TABS: ORAL_TABLET | ORAL | 2 refills | Status: DC

## 2022-10-03 MED ORDER — LOSARTAN POTASSIUM 50 MG PO TABS: 50.0000 mg | ORAL_TABLET | Freq: Every day | ORAL | 2 refills | Status: DC

## 2022-10-03 NOTE — Patient Instructions (Addendum)
If any acute changes in walking, neck pain or weakness, be seen. Keep follow up with Dr. [name] as planned.  Let me know if you do have any neck pain or would like to meet with a neurosurgeon to see if some of the cervical spine/neck issues could be contributing to some of your leg symptoms.  No med changes for now.  Labs have been ordered for a fasting lab visit, to be performed at your convenience at the lab location below.  I will let you know if there are any concerns. Take care.   [facility] in 8:30-4:30 during weekdays, no appointment needed [place].  [place], [place] [address]

## 2022-10-03 NOTE — Assessment & Plan Note (Signed)
Tolerating losartan 50 mg daily.  Check labs, medication adjustment accordingly.  Refilled same dose

## 2022-10-03 NOTE — Progress Notes (Signed)
Subjective:  Patient ID: Travis Ellis, male    DOB: February 04, 1956  Age: 67 y.o. MRN: [id number]  CC:  Chief Complaint  Patient presents with   Hyperlipidemia   Hypertension   Difficulty Walking    Pt notes he feels himself going slower and didn't know what he could do about this     HPI Travis Ellis presents for   Hypertension: Losartan 50 mg daily. No new side effects.  Home readings: none. BP Readings from Last 3 Encounters:  10/03/22 118/78  05/16/22 128/72  04/01/22 134/76   Lab Results  Component Value Date   CREATININE 0.51 11/07/2021   Hyperlipidemia: Simvastatin 20 mg daily, not fasting today.  No new myalgias.  Lab Results  Component Value Date   CHOL 142 11/07/2021   HDL 41.50 11/07/2021   LDLCALC 78 11/07/2021   TRIG 111.0 11/07/2021   CHOLHDL 3 11/07/2021   Lab Results  Component Value Date   ALT 43 11/07/2021   AST 32 11/07/2021   ALKPHOS 52 11/07/2021   BILITOT 1.2 11/07/2021    Prediabetes: History of prediabetes, borderline but normal range in 2022. No increased thirst or blurry vision.  Lab Results  Component Value Date   HGBA1C 5.6 05/09/2021   Wt Readings from Last 3 Encounters:  10/03/22 213 lb (96.6 kg)  05/16/22 214 lb (97.1 kg)  04/01/22 214 lb (97.1 kg)   Peripheral neuropathy Followed by Dr. [name], neurology. Uses cane for difficulty walking distances.  05/16/2022 appointment noted.  Slowly progressive distal weakness with bilateral foot drop and gait abnormality.  MRI of cervical thoracic spine, labs ordered to rule out treatable etiology and genetic testing ordered - cost prohibitive. Thoracic spine without significant abnormality, LT lateral degenerative changes most notable at C5-6-C6-7 with mild canal stenosis and severe bilateral foraminal stenosis on C-spine MRI.  No significant lab abnormalities.  Follow-up appointment April 9 with Dr. [name].  Symptoms similar to his last visit, slowed down as far as average steps  walked.  No neck pain. Bicep issue in past on right arm.  Treated by ortho. No hand weakness, not dropping objects.    Some family stressors with mental properties, mother's health, she is still living with her, had a fall recently.  Denies any needs for assistance at this time, feels like he is handling stressors okay.  History Patient Active Problem List   Diagnosis Date Noted   Left rotator cuff tear 10/22/2018   Risk for coronary artery disease between 10% and 20% in next 10 years 11/27/2016   Prostate cancer ([place]) 09/14/2012   Abnormal gait 05/21/2012   HTN (hypertension) 02/03/2012   Dyslipidemia 02/03/2012   Peripheral neuropathy 02/03/2012    Past Medical History:  Diagnosis Date   Arthritis    generalized   GERD (gastroesophageal reflux disease)    hx of   Hyperlipidemia    on meds   Hypertension    on meds   Neuromuscular disorder ([facility])    Peripheral neuropathy    RIGHT sided foot drop   Prostate cancer ([place])    pT2aN0Mx adenocarcinoma of the prostate   Seasonal allergies       Review of Systems  Constitutional:  Negative for fatigue and unexpected weight change.  Eyes:  Negative for visual disturbance.  Respiratory:  Negative for cough, chest tightness and shortness of breath.   Cardiovascular:  Negative for chest pain, palpitations and leg swelling.  Gastrointestinal:  Negative for abdominal pain  and blood in stool.  Neurological:  Negative for dizziness, light-headedness and headaches.     Objective:   Vitals:   10/03/22 0924  BP: 118/78  Pulse: 93  Temp: 97.8 F (36.6 C)  TempSrc: Oral  SpO2: 96%  Weight: 213 lb (96.6 kg)  Height: 6' (1.829 m)     Physical Exam Vitals reviewed.  Constitutional:      Appearance: He is well-developed.  HENT:     Head: Normocephalic and atraumatic.  Neck:     Vascular: No carotid bruit or JVD.  Cardiovascular:     Rate and Rhythm: Normal rate and regular rhythm.     Heart sounds: Normal heart sounds.  No murmur heard. Pulmonary:     Effort: Pulmonary effort is normal.     Breath sounds: Normal breath sounds. No rales.  Musculoskeletal:     Right lower leg: No edema.     Left lower leg: No edema.     Comments: Equal and intact upper extremity strength. C-spine, pain-free range of motion but decreased extension, rotation.  No pain into the arms or radicular symptoms. Difficulty with dorsiflexion on feet bilaterally.  Skin:    General: Skin is warm and dry.  Neurological:     Mental Status: He is alert and oriented to person, place, and time.  Psychiatric:        Mood and Affect: Mood normal.        Behavior: Behavior normal.        Assessment & Plan:  Travis Ellis is a 67 y.o. male . Essential hypertension -     Losartan Potassium; Take 1 tablet (50 mg total) by mouth daily.  Dispense: 90 tablet; Refill: 2 -     Comprehensive metabolic panel; Future  Dyslipidemia Assessment & Plan: Tolerating simvastatin, continue same dose, labs ordered with medication adjustment accordingly based on results.  Orders: -     Simvastatin; TAKE 1 TABLET BY MOUTH EVERYDAY AT BEDTIME  Dispense: 90 tablet; Refill: 2 -     Comprehensive metabolic panel; Future -     Lipid panel; Future  Abnormal gait Assessment & Plan: With peripheral neuropathy, progressive weakness, gait difficulty, followed by neurology as above.  Status post labs without apparent concern, genetic testing cost prohibitive.  C-spine with some degenerative changes, foraminal stenosis, canal stenosis.  Could be contributing but no appreciable arm/upper extremity weakness.  Denies neck pain.  Option to meet with neurosurgery, but he will plan to discuss follow-up and next steps with his neurologist.  RTC precautions given.   Other polyneuropathy  History of prediabetes -     Hemoglobin A1c; Future  Primary hypertension Assessment & Plan: Tolerating losartan 50 mg daily.  Check labs, medication adjustment accordingly.   Refilled same dose     Patient Instructions  If any acute changes in walking, neck pain or weakness, be seen. Keep follow up with Dr. [name] as planned.  Let me know if you do have any neck pain or would like to meet with a neurosurgeon to see if some of the cervical spine/neck issues could be contributing to some of your leg symptoms.  No med changes for now.  Labs have been ordered for a fasting lab visit, to be performed at your convenience at the lab location below.  I will let you know if there are any concerns. Take care.   [place] [facility] in 8:30-4:30 during weekdays, no appointment needed [place].  [place],  [place] [address]     Signed,   Merri Ray, MD [place] 10/03/22 10:02 AM

## 2022-10-03 NOTE — Assessment & Plan Note (Signed)
Tolerating simvastatin, continue same dose, labs ordered with medication adjustment accordingly based on results.

## 2022-10-03 NOTE — Assessment & Plan Note (Signed)
With peripheral neuropathy, progressive weakness, gait difficulty, followed by neurology as above.  Status post labs without apparent concern, genetic testing cost prohibitive.  C-spine with some degenerative changes, foraminal stenosis, canal stenosis.  Could be contributing but no appreciable arm/upper extremity weakness.  Denies neck pain.  Option to meet with neurosurgery, but he will plan to discuss follow-up and next steps with his neurologist.  RTC precautions given.

## 2022-10-10 ENCOUNTER — Other Ambulatory Visit (INDEPENDENT_AMBULATORY_CARE_PROVIDER_SITE_OTHER): Payer: Medicare HMO

## 2022-10-10 DIAGNOSIS — I10 Essential (primary) hypertension: Secondary | ICD-10-CM | POA: Diagnosis not present

## 2022-10-10 DIAGNOSIS — Z8639 Personal history of other endocrine, nutritional and metabolic disease: Secondary | ICD-10-CM | POA: Diagnosis not present

## 2022-10-10 DIAGNOSIS — E785 Hyperlipidemia, unspecified: Secondary | ICD-10-CM | POA: Diagnosis not present

## 2022-10-10 DIAGNOSIS — Z87898 Personal history of other specified conditions: Secondary | ICD-10-CM

## 2022-10-10 LAB — COMPREHENSIVE METABOLIC PANEL
ALT: 36 U/L (ref 0–53)
AST: 25 U/L (ref 0–37)
Albumin: 3.9 g/dL (ref 3.5–5.2)
Alkaline Phosphatase: 47 U/L (ref 39–117)
BUN: 24 mg/dL — ABNORMAL HIGH (ref 6–23)
CO2: 23 mEq/L (ref 19–32)
Calcium: 9.3 mg/dL (ref 8.4–10.5)
Chloride: 105 mEq/L (ref 96–112)
Creatinine, Ser: 0.55 mg/dL (ref 0.40–1.50)
GFR: 101.88 mL/min (ref >60.00)
Glucose, Bld: 133 mg/dL — ABNORMAL HIGH (ref 70–99)
Potassium: 3.9 mEq/L (ref 3.5–5.1)
Sodium: 140 mEq/L (ref 135–145)
Total Bilirubin: 1 mg/dL (ref 0.2–1.2)
Total Protein: 6.5 g/dL (ref 6.0–8.3)

## 2022-10-10 LAB — LIPID PANEL
Cholesterol: 129 mg/dL (ref 0–200)
HDL: 37.7 mg/dL — ABNORMAL LOW (ref >39.00)
LDL Cholesterol: 67 mg/dL (ref 0–99)
NonHDL: 91.35
Total CHOL/HDL Ratio: 3
Triglycerides: 122 mg/dL (ref 0.0–149.0)
VLDL: 24.4 mg/dL (ref 0.0–40.0)

## 2022-10-10 LAB — HEMOGLOBIN A1C: Hgb A1c MFr Bld: 5.9 % (ref 4.6–6.5)

## 2022-12-09 ENCOUNTER — Other Ambulatory Visit: Payer: Self-pay | Admitting: Family Medicine

## 2022-12-09 DIAGNOSIS — M25511 Pain in right shoulder: Secondary | ICD-10-CM

## 2022-12-10 ENCOUNTER — Encounter: Payer: Self-pay | Admitting: Neurology

## 2022-12-10 ENCOUNTER — Ambulatory Visit: Payer: Medicare HMO | Admitting: Neurology

## 2022-12-10 VITALS — BP 119/70 | HR 82 | Ht 72.0 in | Wt 211.5 lb

## 2022-12-10 DIAGNOSIS — G600 Hereditary motor and sensory neuropathy: Secondary | ICD-10-CM | POA: Diagnosis not present

## 2022-12-10 DIAGNOSIS — R269 Unspecified abnormalities of gait and mobility: Secondary | ICD-10-CM

## 2022-12-10 NOTE — Progress Notes (Signed)
Chief Complaint  Patient presents with   Follow-up    Rm 12. Alone. Pt reports a slow, steady decline in leg strength. No falls.      ASSESSMENT AND PLAN  Travis Ellis is a 67 y.o. male   Slowly progressive distal weakness, bilateral foot drop, profound gait abnormality,   On examinations, profound distal weakness, length-dependent sensory changes, brisk knee reflex with absent ankle reflex,  MRI of cervical spine from September 2023 showed multilevel degenerative changes, most noticeable at C6-7, with mild to moderate spinal stenosis, severe bilateral foraminal stenosis, there was no cord signal abnormality on imaging findings, no clear myelopathy symptoms by history, not a surgical candidate at this point, he does not want to consider surgical intervention either  MRI of of thoracic spine was normal  Multiple rounds of extensive laboratory evaluation over the years did not find treatable etiology  I have ordered and faxed [facility] genetic testing, patient does not have children, does not want to proceed with the test at this point,  Encouraged him moderate exercise, fall precaution,  He will contact clinic for follow-up for rising new issues  DIAGNOSTIC DATA (LABS, IMAGING, TESTING) - I reviewed patient records, labs, notes, testing and imaging myself where available.  Lab in March 2023: normal Lipid Panel, CMP, A1C 5.6, TSH  MEDICAL HISTORY:  Travis Ellis, is a 67 year old male seen in request by his primary care physician Dr. [name] for evaluation of slow worsening gait abnormality, initial evaluation May 16, 2022  I reviewed and summarized the referring note.  Past medical history Hypertension Hyperlipidemia Was a cancer Right knee replacement in 2020 Left rotator cuff repair October 2016  Patient was seen by Dr. [name] since November 2021, he reported gradual onset lower extremity weakness, gait abnormality around 2007,  tripped over a curb,  fell, right leg is more obvious, seen by Dr. [name] at that time, later transferred his care to Dr. 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 [name], MRI of lumbar reported only mild degenerative changes, slow progressive bilateral lower extremity weakness, also slow worsening atrophy of his legs, right worse than left, denies significant numbness or paresthesia, occasionally fall,  ambulate without assistant device, occasionally uses right AFO, but never able to adapt it well, at that time, he is able to ambulate to 3 miles daily without significant difficulty,  Had a repeat EMG nerve conduction study and ultrasound in August 2017 but I do not have the report  MRI of the lumbar spine at [facility] October 2016, small central to left central disc protrusion at L5-S1 with potential impingement on the traversing left S1 nerve root multilevel mild degenerative changes no significant canal foraminal narrowing  Over the years, he noticed mild bilateral arm weakness, difficulty using left arm for heavy object, increased gait abnormality Urinary urgency, incontinence that is associated his prostate cancer, he had a history of radical prostatectomy, radiation therapy,  No family history of similar disease, he has no children, lives with his wife,  He is still trying to be active, working on a house project,  Slow decline in functional status especially since 2021, rely on shopping cart in the grocery store,  EMG nerve conduction study by Dr. [name] in May 2022: With normal bilateral tibial distal latency, CMAP amplitude, borderline conduction velocity, normal bilateral sural peroneal sensory response, F-wave latency was normal on the left tibial,  prolonged on the right side.  Needle examination showed chronic neuropathic changes involving tibialis anterior, peroneal longus,  most recent visit with Dr. [name] in  September 2022, given the diagnosis of primary motor neuropathy, also had a history of bilateral hip arthritis, right worse than left, control of right hip pain with injection has improved his discomfort and strength,  On examination, describe mild bilateral arm deltoid, external rotation weakness, significant weakness of bilateral lower extremity, prominent bilateral foot drop, right hip flexion 4+, near normal on the left side,  UPDATE April 9th 2024: He drove himself to clinic today, significant gait abnormality, use cane, does have urinary urgency, incontinence, he contributed to his previous prostate surgery for prostate cancer, recent years, also had occasionally bowel urgency incontinence, but rarely happen  He continued complaints slow worsening bilateral lower extremity paresthesia, weakness, increased gait abnormality, only has left AFO, he paid high price for it, but denies significant improvement  Personally reviewed MRI of cervical spine, multilevel degenerative changes most noticeable at C6-7, with mild to moderate canal stenosis, severe biforaminal stenosis no cord signal abnormality  MRI of thoracic spine was normal  Patient has mild bilateral shoulder weakness, he contributed to his rotator cuff, shoulder pain  PHYSICAL EXAM:   Vitals:   12/10/22 1502  BP: 119/70  Pulse: 82  Weight: 211 lb 8 oz (95.9 kg)  Height: 6' (1.829 m)   Body mass index is 28.68 kg/m.  PHYSICAL EXAMNIATION:  Gen: NAD, conversant, well nourised, well groomed                     Cardiovascular: Regular rate rhythm, no peripheral edema, warm, nontender. Eyes: Conjunctivae clear without exudates or hemorrhage Neck: Supple, no carotid bruits. Pulmonary: Clear to auscultation bilaterally   NEUROLOGICAL EXAM:  MENTAL STATUS: Speech/cognition: Awake, alert, oriented to history taking and casual conversation CRANIAL NERVES: CN II: Visual fields are full to confrontation. Pupils are round equal  and briskly reactive to light. CN Ellis, IV, VI: extraocular movement are normal. No ptosis. CN V: Facial sensation is intact to light touch CN VII: Mild eye closure, cheek puff weakness CN VIII: Hearing is normal to causal conversation. CN IX, X: Phonation is normal. CN XI: Head turning and shoulder shrug are intact  MOTOR: Limited range of motion of right shoulder due to previous history of right rotator cuff surgery, no significant upper extremity proximal and distal muscle weakness, Mild right hip flexion weakness, did report a history of bilateral hip joint pain, right worse than left, no significant bilateral knee flexion extension weakness,  Muscle atrophy of bilateral leg, profound bilateral ankle dorsiflexion, plantarflexion weakness, 1/5, fairly symmetric,  REFLEXES: Reflexes are 1  and symmetric at the biceps, triceps, 3/3 knees, and absent at ankles. Plantar responses are mute.  SENSORY: Length dependent decreased vibratory sensation, pinprick to mid shin level  COORDINATION: There is no trunk or limb dysmetria noted.  GAIT/STANCE: Need push-up to get up from seated position, leaning forward, bilateral foot drop, rely on his cane  REVIEW OF SYSTEMS:  Full 14 system review of systems performed and notable only for as above All other review of systems were negative.   ALLERGIES: No Known Allergies  HOME MEDICATIONS: Current Outpatient Medications  Medication Sig Dispense Refill   aspirin EC 81 MG tablet Take 1 tablet (81 mg total) by mouth daily. 365 tablet 0   losartan (COZAAR) 50 MG tablet Take 1 tablet (50 mg total) by mouth daily. 90 tablet  2   meloxicam (MOBIC) 15 MG tablet Take 15 mg by mouth as needed.     Multiple Vitamin (MULTIVITAMIN) tablet Take 1 tablet by mouth daily.     simvastatin (ZOCOR) 20 MG tablet TAKE 1 TABLET BY MOUTH EVERYDAY AT BEDTIME 90 tablet 2   Current Facility-Administered Medications  Medication Dose Route Frequency Provider Last Rate  Last Admin   0.9 %  sodium chloride infusion  500 mL Intravenous Once Hilarie Fredrickson, MD        PAST MEDICAL HISTORY: Past Medical History:  Diagnosis Date   Arthritis    generalized   GERD (gastroesophageal reflux disease)    hx of   Hyperlipidemia    on meds   Hypertension    on meds   Neuromuscular disorder    Peripheral neuropathy    RIGHT sided foot drop   Prostate cancer    [id number] adenocarcinoma of the prostate   Seasonal allergies     PAST SURGICAL HISTORY: Past Surgical History:  Procedure Laterality Date   CHOLECYSTECTOMY N/A 03/16/2015   Procedure: LAPAROSCOPIC CHOLECYSTECTOMY WITH INTRAOPERATIVE CHOLANGIOGRAM;  Surgeon: Luretha Murphy, MD;  Location: [facility];  Service: General;  Laterality: N/A;   COLONOSCOPY  2012   JP-movi(exc)   FOOT SURGERY Right 1974   stepped on piece of glass- removed glass particles   REPLACEMENT TOTAL KNEE Right 2010   ROTATOR CUFF REPAIR Left 06/21/2015    FAMILY HISTORY: Family History  Problem Relation Age of Onset   Breast cancer Mother    Cancer Mother        skin   Heart disease Father        heart attack   COPD Father        smoker   Hypertension Father    Heart disease Paternal Grandfather    Hyperlipidemia Paternal Grandfather    Colon polyps Neg Hx    Colon cancer Neg Hx    Esophageal cancer Neg Hx    Rectal cancer Neg Hx    Stomach cancer Neg Hx     SOCIAL HISTORY: Social History   Socioeconomic History   Marital status: Married    Spouse name: karen   Number of children: Not on file   Years of education: Not on file   Highest education level: Not on file  Occupational History   Occupation: retired  Tobacco Use   Smoking status: Never   Smokeless tobacco: Never  Vaping Use   Vaping Use: Never used  Substance and Sexual Activity   Alcohol use: Yes    Comment: monthly use   Drug use: No   Sexual activity: Yes  Other Topics Concern   Not on file  Social History Narrative   Lives with spouse    Right Handed   Drinks 4 cups caffeine   Social Determinants of Health   Financial Resource Strain: Low Risk  (03/21/2022)   Overall Financial Resource Strain (CARDIA)    Difficulty of Paying Living Expenses: Not hard at all  Food Insecurity: No Food Insecurity (03/21/2022)   Hunger Vital Sign    Worried About Running Out of Food in the Last Year: Never true    Ran Out of Food in the Last Year: Never true  Transportation Needs: No Transportation Needs (03/21/2022)   PRAPARE - Administrator, Civil Service (Medical): No    Lack of Transportation (Non-Medical): No  Physical Activity: Insufficiently Active (03/21/2022)   Exercise Vital Sign  Days of Exercise per Week: 3 days    Minutes of Exercise per Session: 30 min  Stress: No Stress Concern Present (03/21/2022)   [employer] - Occupational Stress Questionnaire    Feeling of Stress : Not at all  Social Connections: Socially Integrated (03/21/2022)   Social Connection and Isolation Panel [NHANES]    Frequency of Communication with Friends and Family: Three times a week    Frequency of Social Gatherings with Friends and Family: Three times a week    Attends Religious Services: More than 4 times per year    Active Member of Clubs or Organizations: Yes    Attends BankerClub or Organization Meetings: 1 to 4 times per year    Marital Status: Married  Catering managerntimate Partner Violence: Not At Risk (03/21/2022)   Humiliation, Afraid, Rape, and Kick questionnaire    Fear of Current or Ex-Partner: No    Emotionally Abused: No    Physically Abused: No    Sexually Abused: No      Levert FeinsteinYijun Syretta Kochel, M.D. Ph.D.  [facility] [address], [address] [place], [place] [address] Ph: [phone] Fax: [phone]  CC:  Shade FloodGreene, Jeffrey R, MD [address] [place],  [place] [id number]  Shade FloodGreene, Jeffrey R, MD

## 2022-12-13 ENCOUNTER — Other Ambulatory Visit: Payer: Self-pay | Admitting: Family Medicine

## 2022-12-13 DIAGNOSIS — M25511 Pain in right shoulder: Secondary | ICD-10-CM

## 2022-12-16 ENCOUNTER — Other Ambulatory Visit: Payer: Self-pay | Admitting: Family Medicine

## 2022-12-16 ENCOUNTER — Other Ambulatory Visit: Payer: Self-pay

## 2022-12-16 DIAGNOSIS — M25511 Pain in right shoulder: Secondary | ICD-10-CM

## 2022-12-16 NOTE — Telephone Encounter (Signed)
Patient is requesting a refill of the following medications: Requested Prescriptions   Pending Prescriptions Disp Refills   meloxicam (MOBIC) 15 MG tablet      Sig: Take 1 tablet (15 mg total) by mouth as needed.    Date of patient request: 12/16/22 Last office visit: 10/03/22 Date of last refill: 06/2022 Last refill amount: 30

## 2022-12-17 ENCOUNTER — Telehealth: Payer: Self-pay | Admitting: Family Medicine

## 2022-12-17 MED ORDER — MELOXICAM 7.5 MG PO TABS: 7.5000 mg | ORAL_TABLET | Freq: Every day | ORAL | 0 refills | Status: DC | PRN

## 2022-12-17 NOTE — Telephone Encounter (Signed)
Temporary refill ordered. Short term, infrequent use recommended. Follow up as planned in August, sooner if frequent need for Mobic.

## 2022-12-17 NOTE — Telephone Encounter (Signed)
Will also try lower dose - 7.5mg  if possible.

## 2022-12-17 NOTE — Addendum Note (Signed)
Addended by: [name] R on: 12/17/2022 11:26 AM   Modules accepted: Orders

## 2022-12-17 NOTE — Telephone Encounter (Signed)
I informed pt of the refill and explained that if he needs another refill on Mobic to make an apt he expressed verbal understanding

## 2022-12-17 NOTE — Telephone Encounter (Signed)
Encourage patient to contact the pharmacy for refills or they can request refills through [facility]  (Please schedule appointment if patient has not been seen in over a year)  Last ov 10/03/22  WHAT PHARMACY WOULD THEY LIKE THIS SENT TO:  [id number] - [place], [place] - [address]    MEDICATION NAME & DOSE:meloxicam (MOBIC) 7.5 MG tablet    NOTES/COMMENTS FROM PATIENT: patient states that his pharmacy need a new prescription for this medication.       Front office please notify patient: It takes 48-72 hours to process rx refill requests Ask patient to call pharmacy to ensure rx is ready before heading there.

## 2022-12-17 NOTE — Addendum Note (Signed)
Addended by: [name] R on: 12/17/2022 11:21 AM   Modules accepted: Orders

## 2022-12-17 NOTE — Telephone Encounter (Signed)
Pt is asking if we can refill his Meloxicam 7.5 mg  He states he understands this medication is not for long term use . He states he refilled the medicine in 06/2022 but our records say 2013

## 2022-12-20 ENCOUNTER — Telehealth: Payer: Self-pay | Admitting: Family Medicine

## 2022-12-20 NOTE — Telephone Encounter (Signed)
Contacted Travis Ellis to schedule their annual wellness visit. Appointment made for 12/26/2022.  Thank you,  [facility] Support [facility] Direct dial  [phone]

## 2022-12-26 ENCOUNTER — Ambulatory Visit (INDEPENDENT_AMBULATORY_CARE_PROVIDER_SITE_OTHER): Payer: Medicare HMO | Admitting: *Deleted

## 2022-12-26 DIAGNOSIS — Z0000 Encounter for general adult medical examination without abnormal findings: Secondary | ICD-10-CM

## 2022-12-26 NOTE — Progress Notes (Signed)
Subjective:   Travis Ellis is a 67 y.o. male who presents for Medicare Annual/Subsequent preventive examination.  I connected with  Travis Ellis on 12/26/22 by a telephone enabled telemedicine application and verified that I am speaking with the correct person using two identifiers.   I discussed the limitations of evaluation and management by telemedicine. The patient expressed understanding and agreed to proceed.  Patient location: home  Provider location: telephone/home     Review of Systems     Cardiac Risk Factors include: advanced age (>47men, >70 women);family history of premature cardiovascular disease;male gender;hypertension;obesity (BMI >30kg/m2)     Objective:    Today's Vitals   There is no height or weight on file to calculate BMI.     12/26/2022    3:04 PM 03/21/2022    8:46 AM 02/26/2021    3:55 PM 11/08/2020    2:48 PM 08/07/2020    2:48 PM 10/20/2019    9:24 AM 03/14/2016    9:38 AM  Advanced Directives  Does Patient Have a Medical Advance Directive? No No No No No No No  Would patient like information on creating a medical advance directive? No - Patient declined No - Patient declined No - Patient declined Yes (Inpatient - patient defers creating a medical advance directive at this time - Information given)  No - Patient declined No - patient declined information    Current Medications (verified) Outpatient Encounter Medications as of 12/26/2022  Medication Sig   aspirin EC 81 MG tablet Take 1 tablet (81 mg total) by mouth daily.   losartan (COZAAR) 50 MG tablet Take 1 tablet (50 mg total) by mouth daily.   meloxicam (MOBIC) 7.5 MG tablet Take 1 tablet (7.5 mg total) by mouth daily as needed for pain.   Multiple Vitamin (MULTIVITAMIN) tablet Take 1 tablet by mouth daily.   simvastatin (ZOCOR) 20 MG tablet TAKE 1 TABLET BY MOUTH EVERYDAY AT BEDTIME   Facility-Administered Encounter Medications as of 12/26/2022  Medication   0.9 %  sodium  chloride infusion    Allergies (verified) Patient has no known allergies.   History: Past Medical History:  Diagnosis Date   Arthritis    generalized   GERD (gastroesophageal reflux disease)    hx of   Hyperlipidemia    on meds   Hypertension    on meds   Neuromuscular disorder    Peripheral neuropathy    RIGHT sided foot drop   Prostate cancer    [id number] adenocarcinoma of the prostate   Seasonal allergies    Past Surgical History:  Procedure Laterality Date   CHOLECYSTECTOMY N/A 03/16/2015   Procedure: LAPAROSCOPIC CHOLECYSTECTOMY WITH INTRAOPERATIVE CHOLANGIOGRAM;  Surgeon: Luretha Murphy, MD;  Location: [facility];  Service: General;  Laterality: N/A;   COLONOSCOPY  2012   JP-movi(exc)   FOOT SURGERY Right 1974   stepped on piece of glass- removed glass particles   REPLACEMENT TOTAL KNEE Right 2010   ROTATOR CUFF REPAIR Left 06/21/2015   Family History  Problem Relation Age of Onset   Breast cancer Mother    Cancer Mother        skin   Heart disease Father        heart attack   COPD Father        smoker   Hypertension Father    Heart disease Paternal Grandfather    Hyperlipidemia Paternal Grandfather    Colon polyps Neg Hx    Colon cancer Neg  Hx    Esophageal cancer Neg Hx    Rectal cancer Neg Hx    Stomach cancer Neg Hx    Social History   Socioeconomic History   Marital status: Married    Spouse name: karen   Number of children: Not on file   Years of education: Not on file   Highest education level: Not on file  Occupational History   Occupation: retired  Tobacco Use   Smoking status: Never   Smokeless tobacco: Never  Vaping Use   Vaping Use: Never used  Substance and Sexual Activity   Alcohol use: Yes    Comment: monthly use   Drug use: No   Sexual activity: Yes  Other Topics Concern   Not on file  Social History Narrative   Lives with spouse   Right Handed   Drinks 4 cups caffeine   Social Determinants of Health   Financial  Resource Strain: Low Risk  (12/26/2022)   Overall Financial Resource Strain (CARDIA)    Difficulty of Paying Living Expenses: Not hard at all  Food Insecurity: No Food Insecurity (12/26/2022)   Hunger Vital Sign    Worried About Running Out of Food in the Last Year: Never true    Ran Out of Food in the Last Year: Never true  Transportation Needs: No Transportation Needs (12/26/2022)   PRAPARE - Administrator, Civil Service (Medical): No    Lack of Transportation (Non-Medical): No  Physical Activity: Insufficiently Active (12/26/2022)   Exercise Vital Sign    Days of Exercise per Week: 4 days    Minutes of Exercise per Session: 30 min  Stress: No Stress Concern Present (12/26/2022)   [employer] of Occupational Health - Occupational Stress Questionnaire    Feeling of Stress : Not at all  Social Connections: Socially Integrated (12/26/2022)   Social Connection and Isolation Panel [NHANES]    Frequency of Communication with Friends and Family: Three times a week    Frequency of Social Gatherings with Friends and Family: More than three times a week    Attends Religious Services: More than 4 times per year    Active Member of [employer] or Organizations: Yes    Attends Engineer, structural: More than 4 times per year    Marital Status: Married    Tobacco Counseling Counseling given: Not Answered   Clinical Intake:  Pre-visit preparation completed: Yes  Pain : No/denies pain     Nutritional Risks: None Diabetes: No  How often do you need to have someone help you when you read instructions, pamphlets, or other written materials from your doctor or pharmacy?: 1 - Never  Diabetic?  no  Interpreter Needed?: No  Information entered by :: Remi Haggard LPN   Activities of Daily Living    12/26/2022    3:07 PM 12/22/2022    9:09 PM  In your present state of health, do you have any difficulty performing the following activities:  Hearing? 0 0  Vision? 0 0   Difficulty concentrating or making decisions? 0 0  Walking or climbing stairs? 1 1  Dressing or bathing? 0 0  Doing errands, shopping? 0 0  Preparing Food and eating ? N N  Using the Toilet? N N  In the past six months, have you accidently leaked urine? Y Y  Do you have problems with loss of bowel control? N N  Managing your Medications? N N  Managing your Finances? N N  Housekeeping  or managing your Housekeeping? N N    Patient Care Team: Shade Flood, MD as PCP - General (Family Medicine)  Indicate any recent Medical Services you may have received from other than [facility] providers in the past year (date may be approximate).     Assessment:   This is a routine wellness examination for Travis Ellis.  Hearing/Vision screen Hearing Screening - Comments:: No trouble hearing Vision Screening - Comments:: Up to date [facility] [address]  Dietary issues and exercise activities discussed: Current Exercise Habits: Home exercise routine, Type of exercise: walking, Time (Minutes): 30, Frequency (Times/Week): 4, Weekly Exercise (Minutes/Week): 120, Intensity: Mild   Goals Addressed             This Visit's Progress    Patient Stated       Maintain muscle mass       Depression Screen    12/26/2022    3:09 PM 10/03/2022    9:22 AM 04/01/2022    3:02 PM 03/21/2022    8:47 AM 03/21/2022    8:45 AM 11/07/2021   10:39 AM 05/09/2021    8:38 AM  PHQ 2/9 Scores  PHQ - 2 Score 0 0 0 0 0 0 0  PHQ- 9 Score 0          Fall Risk    12/26/2022    3:02 PM 12/22/2022    9:09 PM 10/03/2022    9:22 AM 03/21/2022    8:47 AM 11/07/2021   10:38 AM  Fall Risk   Falls in the past year? 1 1 1  0 1  Number falls in past yr: 0 0 0 0 0  Injury with Fall? 0 0 0 0 0  Risk for fall due to : Impaired balance/gait  History of fall(s)  History of fall(s);No Fall Risks  Risk for fall due to: Comment    uses cane   Follow up Falls evaluation completed;Education provided;Falls prevention discussed  Falls  evaluation completed Education provided;Falls evaluation completed Falls evaluation completed    FALL RISK PREVENTION PERTAINING TO THE HOME:  Any stairs in or around the home? Yes  If so, are there any without handrails? No  Home free of loose throw rugs in walkways, pet beds, electrical cords, etc? Yes  Adequate lighting in your home to reduce risk of falls? Yes   ASSISTIVE DEVICES UTILIZED TO PREVENT FALLS:  Life alert? No  Use of a cane, walker or w/c? Yes  Grab bars in the bathroom? Yes  Shower chair or bench in shower? No  Elevated toilet seat or a handicapped toilet? No   TIMED UP AND GO:  Was the test performed? No .    Cognitive Function:        12/26/2022    3:06 PM 11/08/2020    2:39 PM 10/20/2019    9:20 AM 09/07/2018    8:44 AM  6CIT Screen  What Year? 0 points 0 points 0 points 0 points  What month? 0 points 0 points 0 points 0 points  What time? 0 points 0 points 0 points 0 points  Count back from 20 0 points 0 points 0 points 0 points  Months in reverse 0 points 0 points 0 points 0 points  Repeat phrase 0 points 0 points 0 points 0 points  Total Score 0 points 0 points 0 points 0 points    Immunizations Immunization History  Administered Date(s) Administered   Fluad Quad(high Dose 65+) 06/01/2021   Influenza Inj  Mdck Quad Pf 08/27/2017, 05/27/2018   Influenza Split 09/06/2015   Influenza, High Dose Seasonal PF 06/13/2020   Influenza-Unspecified 05/27/2018, 06/01/2021, 05/31/2022   PFIZER(Purple Top)SARS-COV-2 Vaccination 10/19/2019, 10/30/2019   Tdap 05/10/2015   Zoster Recombinat (Shingrix) 07/12/2022    TDAP status: Up to date  Flu Vaccine status: Up to date  Pneumococcal vaccine status: Due, Education has been provided regarding the importance of this vaccine. Advised may receive this vaccine at local pharmacy or Health Dept. Aware to provide a copy of the vaccination record if obtained from local pharmacy or Health Dept. Verbalized acceptance  and understanding.  Covid-19 vaccine status: Information provided on how to obtain vaccines.   Qualifies for Shingles Vaccine? No   Zostavax completed No   Shingrix Completed?: Yes  Screening Tests Health Maintenance  Topic Date Due   COVID-19 Vaccine (3 - Pfizer risk series) 01/11/2023 (Originally 11/27/2019)   Pneumonia Vaccine 93+ Years old (1 of 2 - PCV) 10/04/2023 (Originally 05/02/1962)   INFLUENZA VACCINE  04/03/2023   Medicare Annual Wellness (AWV)  12/26/2023   DTaP/Tdap/Td (2 - Td or Tdap) 05/09/2025   COLONOSCOPY (Pts 45-32yrs Insurance coverage will need to be confirmed)  08/03/2031   Hepatitis C Screening  Completed   Zoster Vaccines- Shingrix  Completed   HPV VACCINES  Aged Out    Health Maintenance  There are no preventive care reminders to display for this patient.   Colorectal cancer screening: Type of screening: Colonoscopy. Completed 2022. Repeat every 10 years  Lung Cancer Screening: (Low Dose CT Chest recommended if Age 35-80 years, 30 pack-year currently smoking OR have quit w/in 15years.) does not qualify.   Lung Cancer Screening Referral:   Additional Screening:  Hepatitis C Screening: does not qualify; Completed 2016  Vision Screening: Recommended annual ophthalmology exams for early detection of glaucoma and other disorders of the eye. Is the patient up to date with their annual eye exam?  Yes  Who is the provider or what is the name of the office in which the patient attends annual eye exams? [facility] If pt is not established with a provider, would they like to be referred to a provider to establish care? No .   Dental Screening: Recommended annual dental exams for proper oral hygiene  Community Resource Referral / Chronic Care Management: CRR required this visit?  No   CCM required this visit?  No      Plan:     I have personally reviewed and noted the following in the patient's chart:   Medical and social history Use of alcohol,  tobacco or illicit drugs  Current medications and supplements including opioid prescriptions. Patient is not currently taking opioid prescriptions. Functional ability and status Nutritional status Physical activity Advanced directives List of other physicians Hospitalizations, surgeries, and ER visits in previous 12 months Vitals Screenings to include cognitive, depression, and falls Referrals and appointments  In addition, I have reviewed and discussed with patient certain preventive protocols, quality metrics, and best practice recommendations. A written personalized care plan for preventive services as well as general preventive health recommendations were provided to patient.     Remi Haggard, LPN   [id number]/61/0960   Nurse Notes:

## 2022-12-26 NOTE — Patient Instructions (Signed)
[name] , Thank you for taking time to come for your Medicare Wellness Visit. I appreciate your ongoing commitment to your health goals. Please review the following plan we discussed and let me know if I can assist you in the future.   Screening recommendations/referrals: Colonoscopy: up to date Recommended yearly ophthalmology/optometry visit for glaucoma screening and checkup Recommended yearly dental visit for hygiene and checkup  Vaccinations: Influenza vaccine: up to date Pneumococcal vaccine: Education provided Tdap vaccine: up to date Shingles vaccine: up to date    Advanced directives: Education provided      Preventive Care 65 Years and Older, Male Preventive care refers to lifestyle choices and visits with your health care provider that can promote health and wellness. What does preventive care include? A yearly physical exam. This is also called an annual well check. Dental exams once or twice a year. Routine eye exams. Ask your health care provider how often you should have your eyes checked. Personal lifestyle choices, including: Daily care of your teeth and gums. Regular physical activity. Eating a healthy diet. Avoiding tobacco and drug use. Limiting alcohol use. Practicing safe sex. Taking low doses of aspirin every day. Taking vitamin and mineral supplements as recommended by your health care provider. What happens during an annual well check? The services and screenings done by your health care provider during your annual well check will depend on your age, overall health, lifestyle risk factors, and family history of disease. Counseling  Your health care provider may ask you questions about your: Alcohol use. Tobacco use. Drug use. Emotional well-being. Home and relationship well-being. Sexual activity. Eating habits. History of falls. Memory and ability to understand (cognition). Work and work Astronomer. Screening  You may have the following  tests or measurements: Height, weight, and BMI. Blood pressure. Lipid and cholesterol levels. These may be checked every 5 years, or more frequently if you are over 57 years old. Skin check. Lung cancer screening. You may have this screening every year starting at age 18 if you have a 30-pack-year history of smoking and currently smoke or have quit within the past 15 years. Fecal occult blood test (FOBT) of the stool. You may have this test every year starting at age 6. Flexible sigmoidoscopy or colonoscopy. You may have a sigmoidoscopy every 5 years or a colonoscopy every 10 years starting at age 89. Prostate cancer screening. Recommendations will vary depending on your family history and other risks. Hepatitis C blood test. Hepatitis B blood test. Sexually transmitted disease (STD) testing. Diabetes screening. This is done by checking your blood sugar (glucose) after you have not eaten for a while (fasting). You may have this done every 1-3 years. Abdominal aortic aneurysm (AAA) screening. You may need this if you are a current or former smoker. Osteoporosis. You may be screened starting at age 75 if you are at high risk. Talk with your health care provider about your test results, treatment options, and if necessary, the need for more tests. Vaccines  Your health care provider may recommend certain vaccines, such as: Influenza vaccine. This is recommended every year. Tetanus, diphtheria, and acellular pertussis (Tdap, Td) vaccine. You may need a Td booster every 10 years. Zoster vaccine. You may need this after age 15. Pneumococcal 13-valent conjugate (PCV13) vaccine. One dose is recommended after age 75. Pneumococcal polysaccharide (PPSV23) vaccine. One dose is recommended after age 85. Talk to your health care provider about which screenings and vaccines you need and how often you need  them. This information is not intended to replace advice given to you by your health care provider.  Make sure you discuss any questions you have with your health care provider. Document Released: 09/15/2015 Document Revised: 05/08/2016 Document Reviewed: 06/20/2015 Elsevier Interactive Patient Education  2017 [place] Prevention in the Home Falls can cause injuries. They can happen to people of all ages. There are many things you can do to make your home safe and to help prevent falls. What can I do on the outside of my home? Regularly fix the edges of walkways and driveways and fix any cracks. Remove anything that might make you trip as you walk through a door, such as a raised step or threshold. Trim any bushes or trees on the path to your home. Use bright outdoor lighting. Clear any walking paths of anything that might make someone trip, such as rocks or tools. Regularly check to see if handrails are loose or broken. Make sure that both sides of any steps have handrails. Any raised decks and porches should have guardrails on the edges. Have any leaves, snow, or ice cleared regularly. Use sand or salt on walking paths during winter. Clean up any spills in your garage right away. This includes oil or grease spills. What can I do in the bathroom? Use night lights. Install grab bars by the toilet and in the tub and shower. Do not use towel bars as grab bars. Use non-skid mats or decals in the tub or shower. If you need to sit down in the shower, use a plastic, non-slip stool. Keep the floor dry. Clean up any water that spills on the floor as soon as it happens. Remove soap buildup in the tub or shower regularly. Attach bath mats securely with double-sided non-slip rug tape. Do not have throw rugs and other things on the floor that can make you trip. What can I do in the bedroom? Use night lights. Make sure that you have a light by your bed that is easy to reach. Do not use any sheets or blankets that are too big for your bed. They should not hang down onto the floor. Have a  firm chair that has side arms. You can use this for support while you get dressed. Do not have throw rugs and other things on the floor that can make you trip. What can I do in the kitchen? Clean up any spills right away. Avoid walking on wet floors. Keep items that you use a lot in easy-to-reach places. If you need to reach something above you, use a strong step stool that has a grab bar. Keep electrical cords out of the way. Do not use floor polish or wax that makes floors slippery. If you must use wax, use non-skid floor wax. Do not have throw rugs and other things on the floor that can make you trip. What can I do with my stairs? Do not leave any items on the stairs. Make sure that there are handrails on both sides of the stairs and use them. Fix handrails that are broken or loose. Make sure that handrails are as long as the stairways. Check any carpeting to make sure that it is firmly attached to the stairs. Fix any carpet that is loose or worn. Avoid having throw rugs at the top or bottom of the stairs. If you do have throw rugs, attach them to the floor with carpet tape. Make sure that you have a light switch at  the top of the stairs and the bottom of the stairs. If you do not have them, ask someone to add them for you. What else can I do to help prevent falls? Wear shoes that: Do not have high heels. Have rubber bottoms. Are comfortable and fit you well. Are closed at the toe. Do not wear sandals. If you use a stepladder: Make sure that it is fully opened. Do not climb a closed stepladder. Make sure that both sides of the stepladder are locked into place. Ask someone to hold it for you, if possible. Clearly mark and make sure that you can see: Any grab bars or handrails. First and last steps. Where the edge of each step is. Use tools that help you move around (mobility aids) if they are needed. These include: Canes. Walkers. Scooters. Crutches. Turn on the lights when you  go into a dark area. Replace any light bulbs as soon as they burn out. Set up your furniture so you have a clear path. Avoid moving your furniture around. If any of your floors are uneven, fix them. If there are any pets around you, be aware of where they are. Review your medicines with your doctor. Some medicines can make you feel dizzy. This can increase your chance of falling. Ask your doctor what other things that you can do to help prevent falls. This information is not intended to replace advice given to you by your health care provider. Make sure you discuss any questions you have with your health care provider. Document Released: 06/15/2009 Document Revised: 01/25/2016 Document Reviewed: 09/23/2014 Elsevier Interactive Patient Education  2017 [employer].

## 2023-01-14 ENCOUNTER — Other Ambulatory Visit: Payer: Self-pay | Admitting: Family Medicine

## 2023-01-14 NOTE — Telephone Encounter (Signed)
Appointment requested if repeat refill needed when refilled in April.  MyChart message sent to patient to clarify use.

## 2023-01-14 NOTE — Telephone Encounter (Signed)
Would you like to refill medication for pt ?

## 2023-01-21 NOTE — Telephone Encounter (Signed)
MyChart message sent to patient last week to clarify use but does not appear he has responded.  Please call and clarify is he still using this medication and with what frequency.  Typically would recommend using that for only a few weeks at a time and it had been refilled in April.

## 2023-02-17 ENCOUNTER — Other Ambulatory Visit: Payer: Self-pay | Admitting: Family Medicine

## 2023-03-14 DIAGNOSIS — C61 Malignant neoplasm of prostate: Secondary | ICD-10-CM | POA: Diagnosis not present

## 2023-03-21 DIAGNOSIS — R310 Gross hematuria: Secondary | ICD-10-CM | POA: Diagnosis not present

## 2023-03-21 DIAGNOSIS — C61 Malignant neoplasm of prostate: Secondary | ICD-10-CM | POA: Diagnosis not present

## 2023-03-21 DIAGNOSIS — N3946 Mixed incontinence: Secondary | ICD-10-CM | POA: Diagnosis not present

## 2023-04-04 ENCOUNTER — Encounter: Payer: Self-pay | Admitting: Family Medicine

## 2023-04-04 ENCOUNTER — Ambulatory Visit: Payer: Medicare HMO | Admitting: Family Medicine

## 2023-04-04 VITALS — BP 126/60 | HR 82 | Temp 97.9°F | Ht 72.0 in | Wt 211.8 lb

## 2023-04-04 DIAGNOSIS — R7303 Prediabetes: Secondary | ICD-10-CM | POA: Diagnosis not present

## 2023-04-04 DIAGNOSIS — R310 Gross hematuria: Secondary | ICD-10-CM | POA: Diagnosis not present

## 2023-04-04 DIAGNOSIS — N200 Calculus of kidney: Secondary | ICD-10-CM | POA: Diagnosis not present

## 2023-04-04 DIAGNOSIS — Z8546 Personal history of malignant neoplasm of prostate: Secondary | ICD-10-CM | POA: Diagnosis not present

## 2023-04-04 DIAGNOSIS — I10 Essential (primary) hypertension: Secondary | ICD-10-CM

## 2023-04-04 DIAGNOSIS — Z125 Encounter for screening for malignant neoplasm of prostate: Secondary | ICD-10-CM

## 2023-04-04 DIAGNOSIS — E785 Hyperlipidemia, unspecified: Secondary | ICD-10-CM | POA: Diagnosis not present

## 2023-04-04 DIAGNOSIS — Z0000 Encounter for general adult medical examination without abnormal findings: Secondary | ICD-10-CM

## 2023-04-04 DIAGNOSIS — R935 Abnormal findings on diagnostic imaging of other abdominal regions, including retroperitoneum: Secondary | ICD-10-CM | POA: Diagnosis not present

## 2023-04-04 LAB — COMPREHENSIVE METABOLIC PANEL
ALT: 30 U/L (ref 0–53)
AST: 22 U/L (ref 0–37)
Albumin: 4 g/dL (ref 3.5–5.2)
Alkaline Phosphatase: 50 U/L (ref 39–117)
BUN: 18 mg/dL (ref 6–23)
CO2: 25 mEq/L (ref 19–32)
Calcium: 9.3 mg/dL (ref 8.4–10.5)
Chloride: 103 mEq/L (ref 96–112)
Creatinine, Ser: 0.49 mg/dL (ref 0.40–1.50)
GFR: 105.14 mL/min (ref >60.00)
Glucose, Bld: 108 mg/dL — ABNORMAL HIGH (ref 70–99)
Potassium: 3.9 mEq/L (ref 3.5–5.1)
Sodium: 137 mEq/L (ref 135–145)
Total Bilirubin: 1.1 mg/dL (ref 0.2–1.2)
Total Protein: 6.4 g/dL (ref 6.0–8.3)

## 2023-04-04 LAB — LIPID PANEL
Cholesterol: 125 mg/dL (ref 0–200)
HDL: 38.9 mg/dL — ABNORMAL LOW (ref >39.00)
LDL Cholesterol: 68 mg/dL (ref 0–99)
NonHDL: 86.59
Total CHOL/HDL Ratio: 3
Triglycerides: 92 mg/dL (ref 0.0–149.0)
VLDL: 18.4 mg/dL (ref 0.0–40.0)

## 2023-04-04 LAB — CBC WITH DIFFERENTIAL/PLATELET
Basophils Absolute: 0 10*3/uL (ref 0.0–0.1)
Basophils Relative: 0.5 % (ref 0.0–3.0)
Eosinophils Absolute: 0.1 10*3/uL (ref 0.0–0.7)
Eosinophils Relative: 1.7 % (ref 0.0–5.0)
HCT: 50.1 % (ref 39.0–52.0)
Hemoglobin: 16.7 g/dL (ref 13.0–17.0)
Lymphocytes Relative: 15.6 % (ref 12.0–46.0)
Lymphs Abs: 1 10*3/uL (ref 0.7–4.0)
MCHC: 33.4 g/dL (ref 30.0–36.0)
MCV: 99.2 fl (ref 78.0–100.0)
Monocytes Absolute: 0.5 10*3/uL (ref 0.1–1.0)
Monocytes Relative: 8 % (ref 3.0–12.0)
Neutro Abs: 4.8 10*3/uL (ref 1.4–7.7)
Neutrophils Relative %: 74.2 % (ref 43.0–77.0)
Platelets: 188 10*3/uL (ref 150.0–400.0)
RBC: 5.05 Mil/uL (ref 4.22–5.81)
RDW: 12.4 % (ref 11.5–15.5)
WBC: 6.4 10*3/uL (ref 4.0–10.5)

## 2023-04-04 LAB — HEMOGLOBIN A1C: Hgb A1c MFr Bld: 5.9 % (ref 4.6–6.5)

## 2023-04-04 MED ORDER — SIMVASTATIN 20 MG PO TABS: ORAL_TABLET | ORAL | 2 refills | Status: DC

## 2023-04-04 MED ORDER — LOSARTAN POTASSIUM 25 MG PO TABS
25.0000 mg | ORAL_TABLET | Freq: Every day | ORAL | 2 refills | Status: DC
Start: 2023-04-04 — End: 2023-11-24

## 2023-04-04 NOTE — Progress Notes (Unsigned)
Subjective:  Patient ID: Travis Ellis, male    DOB: [birth year]  Age: 67 y.o. MRN: [id number]  CC:  Chief Complaint  Patient presents with   Annual Exam    Pt reports doing well has a CT scan later today     HPI Travis Ellis presents for Annual Exam PCP, me Neuro, Dr. Terrace Arabia, hereditary sensorimotor neuropathy and gait abnormality.  Office visit in April.  Present note multiple rounds of extensive laboratory evaluation of the years without treatable etiology.  Declined [facility] genetic testing.  Fall precautions, monitor exercise discussed.  Prior MRI of cervical spine with multilevel degenerative changes, mild to moderate spinal stenosis and severe bilateral foraminal stenosis but no cord signal abnormality, no clear myopathy symptoms by history and not a surgical candidate. Urology, Dr. Laverle Patter, prostate cancer, annual surveillance with undetectable PSA based on last note July 19, 1 year follow-up.,  Mixed incontinence with neurogenic bladder, stable without further treatment or alternative management strategies planned.  Cystoscopy indicated radiation changes as likely contributor to gross hematuria but plan for CT to rule out other sources -planned today. Ortho, Dr. Luiz Blare.  History of pain in right shoulder, bicep, previous injection last year with orthopedics, some improvement at that time and then returned on follow-up visits, meloxicam prescribed but also recommended follow-up with Ortho.  R arm issue: As above. No recent ortho visit.  Occasional pain into R bicep, intermittent weakness into bicep - weakness has been discussed with neuro as above with C spine disease. Plans to discuss with is orthopaedist. Only taking mobic as needed - infrequent - 1-2 times per month.    Prediabetes: Diet approach. Has cut back on sodas. 1/2 sweet tea. Drinking more water.  Lab Results  Component Value Date   HGBA1C 5.9 10/10/2022   Wt Readings from Last 3 Encounters:  04/04/23 211 lb  12.8 oz (96.1 kg)  12/10/22 211 lb 8 oz (95.9 kg)  10/03/22 213 lb (96.6 kg)   Hypertension: Treated with losartan 50 mg daily. No new side effects. Well controlled, would like to consider lower dose.  Home readings: simiiar range - under 130 BP Readings from Last 3 Encounters:  04/04/23 126/60  12/10/22 119/70  10/03/22 118/78   Lab Results  Component Value Date   CREATININE 0.55 10/10/2022   Hyperlipidemia: Simvastatin 20 mg daily, no new myalgia/side effects  Lab Results  Component Value Date   CHOL 129 10/10/2022   HDL 37.70 (L) 10/10/2022   LDLCALC 67 10/10/2022   TRIG 122.0 10/10/2022   CHOLHDL 3 10/10/2022   Lab Results  Component Value Date   ALT 36 10/10/2022   AST 25 10/10/2022   ALKPHOS 47 10/10/2022   BILITOT 1.0 10/10/2022        12/26/2022    3:09 PM 10/03/2022    9:22 AM 04/01/2022    3:02 PM 03/21/2022    8:47 AM 03/21/2022    8:45 AM  Depression screen PHQ 2/9  Decreased Interest 0 0 0 0 0  Down, Depressed, Hopeless 0 0 0 0 0  PHQ - 2 Score 0 0 0 0 0  Altered sleeping 0      Tired, decreased energy 0      Change in appetite 0      Feeling bad or failure about yourself  0      Trouble concentrating 0      Moving slowly or fidgety/restless 0      Suicidal thoughts 0  PHQ-9 Score 0      Difficult doing work/chores Not difficult at all        Health Maintenance  Topic Date Due   COVID-19 Vaccine (3 - Pfizer risk series) 11/27/2019   INFLUENZA VACCINE  04/03/2023   Pneumonia Vaccine 43+ Years old (1 of 1 - PCV) 10/04/2023 (Originally 05/03/2019)   Medicare Annual Wellness (AWV)  12/26/2023   DTaP/Tdap/Td (2 - Td or Tdap) 05/09/2025   Colonoscopy  08/03/2031   Hepatitis C Screening  Completed   Zoster Vaccines- Shingrix  Completed   HPV VACCINES  Aged Out  Colonoscopy 08/2021. Dr. Marina Goodell, repeat 10 years.  Prostate: followed by urology for prostate CA.   Immunization History  Administered Date(s) Administered   Fluad Quad(high Dose 65+)  06/01/2021   Influenza Inj Mdck Quad Pf 08/27/2017, 05/27/2018   Influenza Split 09/06/2015   Influenza, High Dose Seasonal PF 06/13/2020   Influenza-Unspecified 05/27/2018, 06/01/2021, 05/31/2022   PFIZER(Purple Top)SARS-COV-2 Vaccination 10/19/2019, 10/30/2019   Tdap 05/10/2015   Zoster Recombinant(Shingrix) 07/12/2022  Second shingrix at pharmacy.  Declines covid booster.  Flu vaccine recommended in fall.  Prevnar recommended. - plans at pharmacy.  RSV vaccine - declines.   No results found. Wears glasses - appt soon.   Dental every 6 months.   Alcohol: 3 per month.   Tobacco: none  Exercise: walking 1000k steps per day. Limited by leg issues above.    History Patient Active Problem List   Diagnosis Date Noted   Left rotator cuff tear 10/22/2018   Risk for coronary artery disease between 10% and 20% in next 10 years 11/27/2016   Prostate cancer (HCC) 09/14/2012   Abnormal gait 05/21/2012   HTN (hypertension) 02/03/2012   Dyslipidemia 02/03/2012   Peripheral neuropathy 02/03/2012   Past Medical History:  Diagnosis Date   Arthritis    generalized   GERD (gastroesophageal reflux disease)    hx of   Hyperlipidemia    on meds   Hypertension    on meds   Neuromuscular disorder (HCC)    Peripheral neuropathy    RIGHT sided foot drop   Prostate cancer (HCC)    [id number] adenocarcinoma of the prostate   Seasonal allergies    Past Surgical History:  Procedure Laterality Date   CHOLECYSTECTOMY N/A 03/16/2015   Procedure: LAPAROSCOPIC CHOLECYSTECTOMY WITH INTRAOPERATIVE CHOLANGIOGRAM;  Surgeon: Luretha Murphy, MD;  Location: [facility];  Service: General;  Laterality: N/A;   COLONOSCOPY  2012   JP-movi(exc)   FOOT SURGERY Right 1974   stepped on piece of glass- removed glass particles   REPLACEMENT TOTAL KNEE Right 2010   ROTATOR CUFF REPAIR Left 06/21/2015   No Known Allergies Prior to Admission medications   Medication Sig Start Date End Date Taking? Authorizing  Provider  aspirin EC 81 MG tablet Take 1 tablet (81 mg total) by mouth daily. 02/23/18  Yes Ofilia Neas, PA-C  losartan (COZAAR) 50 MG tablet Take 1 tablet (50 mg total) by mouth daily. 10/03/22  Yes Shade Flood, MD  Multiple Vitamin (MULTIVITAMIN) tablet Take 1 tablet by mouth daily.   Yes [provider]  simvastatin (ZOCOR) 20 MG tablet TAKE 1 TABLET BY MOUTH EVERYDAY AT BEDTIME 10/03/22  Yes Shade Flood, MD   Social History   Socioeconomic History   Marital status: Married    Spouse name: karen   Number of children: Not on file   Years of education: Not on file   Highest education level: Not  on file  Occupational History   Occupation: retired  Tobacco Use   Smoking status: Never   Smokeless tobacco: Never  Vaping Use   Vaping status: Never Used  Substance and Sexual Activity   Alcohol use: Yes    Comment: monthly use   Drug use: No   Sexual activity: Yes  Other Topics Concern   Not on file  Social History Narrative   Lives with spouse   Right Handed   Drinks 4 cups caffeine   Social Determinants of Health   Financial Resource Strain: Low Risk  (12/26/2022)   Overall Financial Resource Strain (CARDIA)    Difficulty of Paying Living Expenses: Not hard at all  Food Insecurity: No Food Insecurity (12/26/2022)   Hunger Vital Sign    Worried About Running Out of Food in the Last Year: Never true    Ran Out of Food in the Last Year: Never true  Transportation Needs: No Transportation Needs (12/26/2022)   PRAPARE - Administrator, Civil Service (Medical): No    Lack of Transportation (Non-Medical): No  Physical Activity: Insufficiently Active (12/26/2022)   Exercise Vital Sign    Days of Exercise per Week: 4 days    Minutes of Exercise per Session: 30 min  Stress: No Stress Concern Present (12/26/2022)   [employer] of Occupational Health - Occupational Stress Questionnaire    Feeling of Stress : Not at all  Social Connections:  Socially Integrated (12/26/2022)   Social Connection and Isolation Panel [NHANES]    Frequency of Communication with Friends and Family: Three times a week    Frequency of Social Gatherings with Friends and Family: More than three times a week    Attends Religious Services: More than 4 times per year    Active Member of [employer] or Organizations: Yes    Attends Banker Meetings: More than 4 times per year    Marital Status: Married  Catering manager Violence: Not At Risk (12/26/2022)   Humiliation, Afraid, Rape, and Kick questionnaire    Fear of Current or Ex-Partner: No    Emotionally Abused: No    Physically Abused: No    Sexually Abused: No    Review of Systems 13 point review of systems per patient health survey noted.  Negative other than as indicated above or in HPI.    Objective:   Vitals:   04/04/23 0933  BP: 126/60  Pulse: 82  Temp: 97.9 F (36.6 C)  TempSrc: Temporal  SpO2: 96%  Weight: 211 lb 12.8 oz (96.1 kg)  Height: 6' (1.829 m)   {Vitals History (Optional):23777}  Physical Exam Vitals reviewed.  Constitutional:      Appearance: He is well-developed.  HENT:     Head: Normocephalic and atraumatic.     Right Ear: External ear normal.     Left Ear: External ear normal.  Eyes:     Conjunctiva/sclera: Conjunctivae normal.     Pupils: Pupils are equal, round, and reactive to light.  Neck:     Thyroid: No thyromegaly.  Cardiovascular:     Rate and Rhythm: Normal rate and regular rhythm.     Heart sounds: Normal heart sounds.  Pulmonary:     Effort: Pulmonary effort is normal. No respiratory distress.     Breath sounds: Normal breath sounds. No wheezing.  Abdominal:     General: There is no distension.     Palpations: Abdomen is soft.     Tenderness: There is no  abdominal tenderness.  Musculoskeletal:        General: Tenderness (Minimal discomfort mid bicep without defect or swelling.) present.     Cervical back: Normal range of motion and  neck supple.     Comments: Some atrophy of lower extremity musculature noted. Right shoulder, no focal bony tenderness.  AC/clavicle nontender.  Slight tenderness over mid bicep without defect.  Full rotator cuff strength, overall intact range of motion on right shoulder.  Slight decrease external rotation strength on left shoulder but no pain.  Lymphadenopathy:     Cervical: No cervical adenopathy.  Skin:    General: Skin is warm and dry.  Neurological:     Mental Status: He is alert and oriented to person, place, and time.     Deep Tendon Reflexes: Reflexes are normal and symmetric.  Psychiatric:        Behavior: Behavior normal.        Assessment & Plan:  Travis Ellis is a 67 y.o. male . Annual physical exam - Plan: Comprehensive metabolic panel, Hemoglobin A1c, Lipid panel, PSA, CBC with Differential/Platelet  Dyslipidemia  Essential hypertension  Prediabetes - Plan: Comprehensive metabolic panel, Hemoglobin A1c  Hypertension, well controlled - Plan: CBC with Differential/Platelet  Hyperlipidemia, unspecified hyperlipidemia type - Plan: Comprehensive metabolic panel, Lipid panel  Screening for prostate cancer - Plan: PSA   No orders of the defined types were placed in this encounter.  There are no Patient Instructions on file for this visit.    Signed,   [name], MD [facility], [facility] 04/04/23 9:51 AM

## 2023-04-04 NOTE — Patient Instructions (Addendum)
We can try lower dose losartan. Keep a record of your blood pressures outside of the office and if over 130/80 - return to 50mg  per day and let me know.  No other med changes at this time.  I do recommend pneumonia vaccine, Prevnar 20 at your pharmacy.  We can also schedule it here if needed. Please follow-up with your orthopedist if any persistent pain in the arm.  Keep follow-up with other specialists as planned.  Let me know if I can help further and hang in there.  Preventive Care 76 Years and Older, Male Preventive care refers to lifestyle choices and visits with your health care provider that can promote health and wellness. Preventive care visits are also called wellness exams. What can I expect for my preventive care visit? Counseling During your preventive care visit, your health care provider may ask about your: Medical history, including: Past medical problems. Family medical history. History of falls. Current health, including: Emotional well-being. Home life and relationship well-being. Sexual activity. Memory and ability to understand (cognition). Lifestyle, including: Alcohol, nicotine or tobacco, and drug use. Access to firearms. Diet, exercise, and sleep habits. Work and work Astronomer. Sunscreen use. Safety issues such as seatbelt and bike helmet use. Physical exam Your health care provider will check your: Height and weight. These may be used to calculate your BMI (body mass index). BMI is a measurement that tells if you are at a healthy weight. Waist circumference. This measures the distance around your waistline. This measurement also tells if you are at a healthy weight and may help predict your risk of certain diseases, such as type 2 diabetes and high blood pressure. Heart rate and blood pressure. Body temperature. Skin for abnormal spots. What immunizations do I need?  Vaccines are usually given at various ages, according to a schedule. Your health care  provider will recommend vaccines for you based on your age, medical history, and lifestyle or other factors, such as travel or where you work. What tests do I need? Screening Your health care provider may recommend screening tests for certain conditions. This may include: Lipid and cholesterol levels. Diabetes screening. This is done by checking your blood sugar (glucose) after you have not eaten for a while (fasting). Hepatitis C test. Hepatitis B test. HIV (human immunodeficiency virus) test. STI (sexually transmitted infection) testing, if you are at risk. Lung cancer screening. Colorectal cancer screening. Prostate cancer screening. Abdominal aortic aneurysm (AAA) screening. You may need this if you are a current or former smoker. Talk with your health care provider about your test results, treatment options, and if necessary, the need for more tests. Follow these instructions at home: Eating and drinking  Eat a diet that includes fresh fruits and vegetables, whole grains, lean protein, and low-fat dairy products. Limit your intake of foods with high amounts of sugar, saturated fats, and salt. Take vitamin and mineral supplements as recommended by your health care provider. Do not drink alcohol if your health care provider tells you not to drink. If you drink alcohol: Limit how much you have to 0-2 drinks a day. Know how much alcohol is in your drink. In the [place] one drink equals one 12 oz bottle of beer (355 mL), one 5 oz glass of wine (148 mL), or one 1 oz glass of hard liquor (44 mL). Lifestyle Brush your teeth every morning and night with fluoride toothpaste. Floss one time each day. Exercise for at least 30 minutes 5 or more days each  week. Do not use any products that contain nicotine or tobacco. These products include cigarettes, chewing tobacco, and vaping devices, such as e-cigarettes. If you need help quitting, ask your health care provider. Do not use drugs. If you are  sexually active, practice safe sex. Use a condom or other form of protection to prevent STIs. Take aspirin only as told by your health care provider. Make sure that you understand how much to take and what form to take. Work with your health care provider to find out whether it is safe and beneficial for you to take aspirin daily. Ask your health care provider if you need to take a cholesterol-lowering medicine (statin). Find healthy ways to manage stress, such as: Meditation, yoga, or listening to music. Journaling. Talking to a trusted person. Spending time with friends and family. Safety Always wear your seat belt while driving or riding in a vehicle. Do not drive: If you have been drinking alcohol. Do not ride with someone who has been drinking. When you are tired or distracted. While texting. If you have been using any mind-altering substances or drugs. Wear a helmet and other protective equipment during sports activities. If you have firearms in your house, make sure you follow all gun safety procedures. Minimize exposure to UV radiation to reduce your risk of skin cancer. What's next? Visit your health care provider once a year for an annual wellness visit. Ask your health care provider how often you should have your eyes and teeth checked. Stay up to date on all vaccines. This information is not intended to replace advice given to you by your health care provider. Make sure you discuss any questions you have with your health care provider. Document Revised: 02/14/2021 Document Reviewed: 02/14/2021 Elsevier Patient Education  2024 [employer].

## 2023-04-10 DIAGNOSIS — H52223 Regular astigmatism, bilateral: Secondary | ICD-10-CM | POA: Diagnosis not present

## 2023-04-10 DIAGNOSIS — H524 Presbyopia: Secondary | ICD-10-CM | POA: Diagnosis not present

## 2023-04-10 DIAGNOSIS — H5203 Hypermetropia, bilateral: Secondary | ICD-10-CM | POA: Diagnosis not present

## 2023-05-02 ENCOUNTER — Telehealth: Payer: Self-pay | Admitting: Family Medicine

## 2023-05-02 NOTE — Telephone Encounter (Signed)
Spoke to patient we cleared this up he is taking the 25mg  and this has been corrected at the pharmacy

## 2023-05-02 NOTE — Telephone Encounter (Signed)
Caller name: CARLYN MCCOLLUM III  On DPR?: Yes  Call back number: [phone] (mobile)  Provider they see: Shade Flood, MD  Reason for call:  Pt states pharmacy called him with 50mg  Losartan refill but on last OV it was changed to 25mg  and he's fine with that it's working well.

## 2023-10-06 ENCOUNTER — Ambulatory Visit: Payer: Medicare HMO | Admitting: Family Medicine

## 2023-10-06 VITALS — BP 128/70 | HR 102 | Temp 98.4°F | Ht 72.0 in | Wt 214.6 lb

## 2023-10-06 DIAGNOSIS — R269 Unspecified abnormalities of gait and mobility: Secondary | ICD-10-CM

## 2023-10-06 DIAGNOSIS — G6289 Other specified polyneuropathies: Secondary | ICD-10-CM | POA: Diagnosis not present

## 2023-10-06 DIAGNOSIS — R7303 Prediabetes: Secondary | ICD-10-CM | POA: Diagnosis not present

## 2023-10-06 DIAGNOSIS — E785 Hyperlipidemia, unspecified: Secondary | ICD-10-CM

## 2023-10-06 DIAGNOSIS — I10 Essential (primary) hypertension: Secondary | ICD-10-CM

## 2023-10-06 NOTE — Progress Notes (Unsigned)
Subjective:  Patient ID: Travis Ellis, male    DOB: May 07, 1956  Age: 68 y.o. MRN: [id number]  CC:  Chief Complaint  Patient presents with   Medical Management of Chronic Issues    Pt notes doing well, no questions at this time.     HPI Travis Ellis presents for follow-up of chronic conditions, physical in August of last year.  Gait abnormality History of hereditary sensorimotor neuropathy with gait abnormality.  Followed closely by Dr. Terrace Arabia, neurology previously.  Multiple rounds of extensive laboratory evaluation without treatable etiology.  MRI of cervical spine with multiple level degenerative changes, mild to moderate spinal stenosis and bilateral foraminal stenosis but no cord signal abnormality or clear myopathy symptoms by history and not surgical candidate.  He does note that due to symptoms of muscle weakness he feels like his ankles are turning inward somewhat - walking on outside of ankles. No falls.not using brace at this time.  Last visit with Dr. Terrace Arabia in April 2024.  Exercise and fall precautions were discussed.  AFO brace previously prescribe for left foot. Uses cane - has walker at home - recommended walker by neuro.  Still trying to walk daily.   Prediabetes: Diet/exercise approach with cutting back on sodas, more water discussed previously. Walking as above. Trying to watch diet, occasional sweets. No change in thirst or urination.  Lab Results  Component Value Date   HGBA1C 5.9 04/04/2023   Wt Readings from Last 3 Encounters:  10/06/23 214 lb 9.6 oz (97.3 kg)  04/04/23 211 lb 12.8 oz (96.1 kg)  12/10/22 211 lb 8 oz (95.9 kg)   Hypertension: Losartan 50 mg daily without any side effects when discussed in August.  Trial of lower dose at 25 mg at that time with RTC precautions. Doing well on 25mg  dose.  Home readings: 120/70 range. BP Readings from Last 3 Encounters:  10/06/23 128/70  04/04/23 126/60  12/10/22 119/70   Lab Results  Component Value  Date   CREATININE 0.49 04/04/2023   Hyperlipidemia: Simvastatin 20 mg daily without new myalgias or side effects. Last ate this am - not fasting - apple fritter at 9am.  Lab Results  Component Value Date   CHOL 125 04/04/2023   HDL 38.90 (L) 04/04/2023   LDLCALC 68 04/04/2023   TRIG 92.0 04/04/2023   CHOLHDL 3 04/04/2023   Lab Results  Component Value Date   ALT 30 04/04/2023   AST 22 04/04/2023   ALKPHOS 50 04/04/2023   BILITOT 1.1 04/04/2023   HM: Immunization History  Administered Date(s) Administered   Fluad Quad(high Dose 65+) 06/01/2021   Influenza Inj Mdck Quad Pf 08/27/2017, 05/27/2018   Influenza Split 09/06/2015   Influenza, High Dose Seasonal PF 06/13/2020   Influenza-Unspecified 05/27/2018, 06/01/2021, 05/31/2022   PFIZER(Purple Top)SARS-COV-2 Vaccination 10/19/2019, 10/30/2019   Tdap 05/10/2015   Zoster Recombinant(Shingrix) 07/12/2022  Prevnar recommended - plans at pharmacy.     History Patient Active Problem List   Diagnosis Date Noted   Left rotator cuff tear 10/22/2018   Risk for coronary artery disease between 10% and 20% in next 10 years 11/27/2016   Prostate cancer (HCC) 09/14/2012   Abnormal gait 05/21/2012   HTN (hypertension) 02/03/2012   Dyslipidemia 02/03/2012   Peripheral neuropathy 02/03/2012   Past Medical History:  Diagnosis Date   Arthritis    generalized   GERD (gastroesophageal reflux disease)    hx of   Hyperlipidemia    on meds  Hypertension    on meds   Neuromuscular disorder (HCC)    Peripheral neuropathy    RIGHT sided foot drop   Prostate cancer (HCC)    [id number] adenocarcinoma of the prostate   Seasonal allergies    Past Surgical History:  Procedure Laterality Date   CHOLECYSTECTOMY N/A 03/16/2015   Procedure: LAPAROSCOPIC CHOLECYSTECTOMY WITH INTRAOPERATIVE CHOLANGIOGRAM;  Surgeon: Luretha Murphy, MD;  Location: [facility];  Service: General;  Laterality: N/A;   COLONOSCOPY  2012   JP-movi(exc)   FOOT SURGERY  Right 1974   stepped on piece of glass- removed glass particles   REPLACEMENT TOTAL KNEE Right 2010   ROTATOR CUFF REPAIR Left 06/21/2015   No Known Allergies Prior to Admission medications   Medication Sig Start Date End Date Taking? Authorizing Provider  aspirin EC 81 MG tablet Take 1 tablet (81 mg total) by mouth daily. 02/23/18  Yes Ofilia Neas, PA-C  losartan (COZAAR) 25 MG tablet Take 1 tablet (25 mg total) by mouth daily. 04/04/23  Yes Shade Flood, MD  Multiple Vitamin (MULTIVITAMIN) tablet Take 1 tablet by mouth daily.   Yes [provider]  simvastatin (ZOCOR) 20 MG tablet TAKE 1 TABLET BY MOUTH EVERYDAY AT BEDTIME 04/04/23  Yes Shade Flood, MD   Social History   Socioeconomic History   Marital status: Married    Spouse name: karen   Number of children: Not on file   Years of education: Not on file   Highest education level: Bachelor's degree (e.g., BA, AB, BS)  Occupational History   Occupation: retired  Tobacco Use   Smoking status: Never   Smokeless tobacco: Never  Vaping Use   Vaping status: Never Used  Substance and Sexual Activity   Alcohol use: Yes    Comment: monthly use   Drug use: No   Sexual activity: Yes  Other Topics Concern   Not on file  Social History Narrative   Lives with spouse   Right Handed   Drinks 4 cups caffeine   Social Drivers of Health   Financial Resource Strain: Low Risk  (10/03/2023)   Overall Financial Resource Strain (CARDIA)    Difficulty of Paying Living Expenses: Not hard at all  Food Insecurity: No Food Insecurity (10/03/2023)   Hunger Vital Sign    Worried About Running Out of Food in the Last Year: Never true    Ran Out of Food in the Last Year: Never true  Transportation Needs: No Transportation Needs (10/03/2023)   PRAPARE - Administrator, Civil Service (Medical): No    Lack of Transportation (Non-Medical): No  Physical Activity: Unknown (10/03/2023)   Exercise Vital Sign    Days of  Exercise per Week: Patient declined    Minutes of Exercise per Session: 30 min  Stress: No Stress Concern Present (10/03/2023)   [employer] of Occupational Health - Occupational Stress Questionnaire    Feeling of Stress : Not at all  Social Connections: Socially Integrated (10/03/2023)   Social Connection and Isolation Panel [NHANES]    Frequency of Communication with Friends and Family: More than three times a week    Frequency of Social Gatherings with Friends and Family: More than three times a week    Attends Religious Services: More than 4 times per year    Active Member of [employer] or Organizations: Yes    Attends Engineer, structural: More than 4 times per year    Marital Status: Married  Intimate  Partner Violence: Not At Risk (12/26/2022)   Humiliation, Afraid, Rape, and Kick questionnaire    Fear of Current or Ex-Partner: No    Emotionally Abused: No    Physically Abused: No    Sexually Abused: No    Review of Systems Per hpi.   Objective:   Vitals:   10/06/23 1046  BP: 128/70  Pulse: (!) 102  Temp: 98.4 F (36.9 C)  TempSrc: Temporal  SpO2: 98%  Weight: 214 lb 9.6 oz (97.3 kg)  Height: 6' (1.829 m)     Physical Exam Vitals reviewed.  Constitutional:      Appearance: He is well-developed.  HENT:     Head: Normocephalic and atraumatic.  Neck:     Vascular: No carotid bruit or JVD.  Cardiovascular:     Rate and Rhythm: Normal rate and regular rhythm.     Heart sounds: Normal heart sounds. No murmur heard. Pulmonary:     Effort: Pulmonary effort is normal.     Breath sounds: Normal breath sounds. No rales.  Musculoskeletal:     Right lower leg: No edema.     Left lower leg: No edema.     Comments: Uses cane, varus gait at knees, accentuated lift to foot, then inverts ankles with walking. Antalgic gait.   Skin:    General: Skin is warm and dry.  Neurological:     Mental Status: He is alert and oriented to person, place, and time.   Psychiatric:        Mood and Affect: Mood normal.        Assessment & Plan:  STELLA ENCARNACION Ellis is a 68 y.o. male . Prediabetes - Plan: Comprehensive metabolic panel, Hemoglobin A1c  -Check labs, adjust plan accordingly  Other polyneuropathy Abnormal gait  -Chronic lower extremity weakness with neuropathy as above, seen by urology previously.  No identifiable treatable cause.  Appears to have progression of weakness, now with inversion of ankles bilaterally.  Does have single ASO brace but recommended he follow-up with neurology to decide on other bracing or assistive device, consider PM&R or orthotist if needed to help in this process.  Will initially have him follow-up with neuro.  Has walker, recommended for stability over what cane may provide.  Hyperlipidemia, unspecified hyperlipidemia type - Plan: Comprehensive metabolic panel, Lipid panel Check labs and adjust plan accordingly.  Hypertension, well controlled - Plan: Comprehensive metabolic panel Stable, continue lower dose losartan.  Check labs as above and adjust plan accordingly.  No orders of the defined types were placed in this encounter.  Patient Instructions  I do recommend walker for now and follow up with neurology to discuss the increased weakness.  If the AFO brace has been helpful.   No med changes at this time.  Take care!    Signed,   [name], MD [facility], [facility] 10/06/23 11:17 AM

## 2023-10-06 NOTE — Patient Instructions (Addendum)
I do recommend walker for now and follow up with neurology to discuss the increased weakness.  If the AFO brace has been helpful.   No med changes at this time.  Take care!

## 2023-10-07 ENCOUNTER — Encounter: Payer: Self-pay | Admitting: Family Medicine

## 2023-10-07 LAB — COMPREHENSIVE METABOLIC PANEL
ALT: 36 U/L (ref 0–53)
AST: 29 U/L (ref 0–37)
Albumin: 4 g/dL (ref 3.5–5.2)
Alkaline Phosphatase: 56 U/L (ref 39–117)
BUN: 17 mg/dL (ref 6–23)
CO2: 26 meq/L (ref 19–32)
Calcium: 9.4 mg/dL (ref 8.4–10.5)
Chloride: 102 meq/L (ref 96–112)
Creatinine, Ser: 0.53 mg/dL (ref 0.40–1.50)
GFR: 102.31 mL/min (ref >60.00)
Glucose, Bld: 144 mg/dL — ABNORMAL HIGH (ref 70–99)
Potassium: 3.6 meq/L (ref 3.5–5.1)
Sodium: 138 meq/L (ref 135–145)
Total Bilirubin: 0.7 mg/dL (ref 0.2–1.2)
Total Protein: 7 g/dL (ref 6.0–8.3)

## 2023-10-07 LAB — LIPID PANEL
Cholesterol: 137 mg/dL (ref 0–200)
HDL: 42.2 mg/dL (ref >39.00)
LDL Cholesterol: 59 mg/dL (ref 0–99)
NonHDL: 94.3
Total CHOL/HDL Ratio: 3
Triglycerides: 177 mg/dL — ABNORMAL HIGH (ref 0.0–149.0)
VLDL: 35.4 mg/dL (ref 0.0–40.0)

## 2023-10-07 LAB — HEMOGLOBIN A1C: Hgb A1c MFr Bld: 6.2 % (ref 4.6–6.5)

## 2023-10-12 ENCOUNTER — Encounter: Payer: Self-pay | Admitting: Family Medicine

## 2023-10-13 NOTE — Telephone Encounter (Signed)
 Pt sent response statement to lab result message

## 2023-11-22 ENCOUNTER — Other Ambulatory Visit: Payer: Self-pay | Admitting: Family Medicine

## 2023-11-22 DIAGNOSIS — E785 Hyperlipidemia, unspecified: Secondary | ICD-10-CM

## 2024-04-12 ENCOUNTER — Telehealth: Payer: Self-pay

## 2024-04-12 NOTE — Telephone Encounter (Signed)
 Patient called back and was advised.

## 2024-04-12 NOTE — Telephone Encounter (Signed)
 Copied from CRM [id number]. Topic: Appointments - Appointment Info/Confirmation >> Apr 12, 2024  9:31 AM [facility] ORN wrote: Patient/patient representative is calling for information regarding an appointment. >> Apr 12, 2024  9:34 AM [facility] ORN wrote: Patient called in wanting to confirm if his upcoming appointment was fasting or not. Patient doesn't recall rather or not Levora Reyes SAUNDERS, MD stated to fast, please confirm?

## 2024-04-15 ENCOUNTER — Encounter: Payer: Self-pay | Admitting: Family Medicine

## 2024-04-15 ENCOUNTER — Ambulatory Visit (INDEPENDENT_AMBULATORY_CARE_PROVIDER_SITE_OTHER): Payer: Medicare HMO | Admitting: Family Medicine

## 2024-04-15 VITALS — BP 118/70 | HR 84 | Wt 217.0 lb

## 2024-04-15 DIAGNOSIS — R269 Unspecified abnormalities of gait and mobility: Secondary | ICD-10-CM | POA: Diagnosis not present

## 2024-04-15 DIAGNOSIS — R7303 Prediabetes: Secondary | ICD-10-CM | POA: Diagnosis not present

## 2024-04-15 DIAGNOSIS — G6289 Other specified polyneuropathies: Secondary | ICD-10-CM

## 2024-04-15 DIAGNOSIS — I10 Essential (primary) hypertension: Secondary | ICD-10-CM

## 2024-04-15 DIAGNOSIS — E785 Hyperlipidemia, unspecified: Secondary | ICD-10-CM | POA: Diagnosis not present

## 2024-04-15 DIAGNOSIS — Z0000 Encounter for general adult medical examination without abnormal findings: Secondary | ICD-10-CM | POA: Diagnosis not present

## 2024-04-15 LAB — LIPID PANEL
Cholesterol: 133 mg/dL (ref 0–200)
HDL: 37.6 mg/dL — ABNORMAL LOW (ref >39.00)
LDL Cholesterol: 53 mg/dL (ref 0–99)
NonHDL: 95.17
Total CHOL/HDL Ratio: 4
Triglycerides: 213 mg/dL — ABNORMAL HIGH (ref 0.0–149.0)
VLDL: 42.6 mg/dL — ABNORMAL HIGH (ref 0.0–40.0)

## 2024-04-15 LAB — COMPREHENSIVE METABOLIC PANEL WITH GFR
ALT: 37 U/L (ref 0–53)
AST: 28 U/L (ref 0–37)
Albumin: 4 g/dL (ref 3.5–5.2)
Alkaline Phosphatase: 50 U/L (ref 39–117)
BUN: 15 mg/dL (ref 6–23)
CO2: 27 meq/L (ref 19–32)
Calcium: 9.3 mg/dL (ref 8.4–10.5)
Chloride: 102 meq/L (ref 96–112)
Creatinine, Ser: 0.44 mg/dL (ref 0.40–1.50)
GFR: 107.83 mL/min (ref >60.00)
Glucose, Bld: 154 mg/dL — ABNORMAL HIGH (ref 70–99)
Potassium: 3.8 meq/L (ref 3.5–5.1)
Sodium: 136 meq/L (ref 135–145)
Total Bilirubin: 0.7 mg/dL (ref 0.2–1.2)
Total Protein: 6.8 g/dL (ref 6.0–8.3)

## 2024-04-15 LAB — HEMOGLOBIN A1C: Hgb A1c MFr Bld: 6.6 % — ABNORMAL HIGH (ref 4.6–6.5)

## 2024-04-15 MED ORDER — SIMVASTATIN 20 MG PO TABS: ORAL_TABLET | ORAL | 3 refills | Status: AC

## 2024-04-15 MED ORDER — LOSARTAN POTASSIUM 25 MG PO TABS: 25.0000 mg | ORAL_TABLET | Freq: Every day | ORAL | 3 refills | Status: AC

## 2024-04-15 NOTE — Patient Instructions (Addendum)
 Keep a record of your blood pressures outside of the office and if running lower, or lightheaded or dizzy - let me know and we can adjust your meds.  Check with [facility], if they have not given you the pneumonia vaccine - it can be given here or your pharmacy.   Thank you for coming in today. No change in medications at this time. If there are any concerns on your bloodwork, I will let you know.  I am sorry to hear of your loss. Please let me know if I can help during this time. Take care.   Preventive Care 34 Years and Older, Male Preventive care refers to lifestyle choices and visits with your health care provider that can promote health and wellness. Preventive care visits are also called wellness exams. What can I expect for my preventive care visit? Counseling During your preventive care visit, your health care provider may ask about your: Medical history, including: Past medical problems. Family medical history. History of falls. Current health, including: Emotional well-being. Home life and relationship well-being. Sexual activity. Memory and ability to understand (cognition). Lifestyle, including: Alcohol, nicotine or tobacco, and drug use. Access to firearms. Diet, exercise, and sleep habits. Work and work Astronomer. Sunscreen use. Safety issues such as seatbelt and bike helmet use. Physical exam Your health care provider will check your: Height and weight. These may be used to calculate your BMI (body mass index). BMI is a measurement that tells if you are at a healthy weight. Waist circumference. This measures the distance around your waistline. This measurement also tells if you are at a healthy weight and may help predict your risk of certain diseases, such as type 2 diabetes and high blood pressure. Heart rate and blood pressure. Body temperature. Skin for abnormal spots. What immunizations do I need?  Vaccines are usually given at various ages, according to a schedule.  Your health care provider will recommend vaccines for you based on your age, medical history, and lifestyle or other factors, such as travel or where you work. What tests do I need? Screening Your health care provider may recommend screening tests for certain conditions. This may include: Lipid and cholesterol levels. Diabetes screening. This is done by checking your blood sugar (glucose) after you have not eaten for a while (fasting). Hepatitis C test. Hepatitis B test. HIV (human immunodeficiency virus) test. STI (sexually transmitted infection) testing, if you are at risk. Lung cancer screening. Colorectal cancer screening. Prostate cancer screening. Abdominal aortic aneurysm (AAA) screening. You may need this if you are a current or former smoker. Talk with your health care provider about your test results, treatment options, and if necessary, the need for more tests. Follow these instructions at home: Eating and drinking  Eat a diet that includes fresh fruits and vegetables, whole grains, lean protein, and low-fat dairy products. Limit your intake of foods with high amounts of sugar, saturated fats, and salt. Take vitamin and mineral supplements as recommended by your health care provider. Do not drink alcohol if your health care provider tells you not to drink. If you drink alcohol: Limit how much you have to 0-2 drinks a day. Know how much alcohol is in your drink. In the [place] one drink equals one 12 oz bottle of beer (355 mL), one 5 oz glass of wine (148 mL), or one 1 oz glass of hard liquor (44 mL). Lifestyle Brush your teeth every morning and night with fluoride toothpaste. Floss one time each day. Exercise  for at least 30 minutes 5 or more days each week. Do not use any products that contain nicotine or tobacco. These products include cigarettes, chewing tobacco, and vaping devices, such as e-cigarettes. If you need help quitting, ask your health care provider. Do not use  drugs. If you are sexually active, practice safe sex. Use a condom or other form of protection to prevent STIs. Take aspirin  only as told by your health care provider. Make sure that you understand how much to take and what form to take. Work with your health care provider to find out whether it is safe and beneficial for you to take aspirin  daily. Ask your health care provider if you need to take a cholesterol-lowering medicine (statin). Find healthy ways to manage stress, such as: Meditation, yoga, or listening to music. Journaling. Talking to a trusted person. Spending time with friends and family. Safety Always wear your seat belt while driving or riding in a vehicle. Do not drive: If you have been drinking alcohol. Do not ride with someone who has been drinking. When you are tired or distracted. While texting. If you have been using any mind-altering substances or drugs. Wear a helmet and other protective equipment during sports activities. If you have firearms in your house, make sure you follow all gun safety procedures. Minimize exposure to UV radiation to reduce your risk of skin cancer. What's next? Visit your health care provider once a year for an annual wellness visit. Ask your health care provider how often you should have your eyes and teeth checked. Stay up to date on all vaccines. This information is not intended to replace advice given to you by your health care provider. Make sure you discuss any questions you have with your health care provider. Document Revised: 02/14/2021 Document Reviewed: 02/14/2021 Elsevier Patient Education  2024 [employer].

## 2024-04-15 NOTE — Progress Notes (Signed)
 Subjective:  Patient ID: Travis Ellis, male    DOB: 1955-09-26  Age: 68 y.o. MRN: [id number]  CC:  Chief Complaint  Patient presents with   Annual Exam    No concerns patient is aware of     HPI Travis Ellis presents for Annual Exam No health changes.  Mom passed away few years ago after fall and time in rehab. Doing ok - maintaining. Declines any needs at this time.   PCP, [facility], Dr. Renda, history of prostate cancer with radical prostatectomy 17 years ago, salvage radiation therapy 10 years ago, PSA undetectable, last visit August 5 with urologist.  Levitra for erectile dysfunction.  Radiation changes of bladder previously with hematuria.  Timed voiding for incontinence.  Myrbetriq cost prohibitive previously. Neurology, Dr. Onita, history of hereditary sensorimotor neuropathy with gait abnormality.  Moderate spinal stenosis on MRI of cervical spine previously, but no cord signal abnormality or clear myopathy symptoms by history and not surgical candidate.  Cane, AFO brace have been used - has walker if needed.  1 fall without injuries.   Hypertension: Losartan  25 mg daily.  Stable at his last visit in February.  Home readings: none. Stable on other medical visits. No side effects, lightheadedness or dizziness.  BP Readings from Last 3 Encounters:  04/15/24 118/70  10/06/23 128/70  04/04/23 126/60   Lab Results  Component Value Date   CREATININE 0.53 10/06/2023   Prediabetes: Diet/exercise approach.  Minimal change in weight since last visit, 3 pounds.  Average walking is down - 1000 steps per day. Cut back on soda/sweet tea, only rare fast food/take out.   Lab Results  Component Value Date   HGBA1C 6.2 10/06/2023   Wt Readings from Last 3 Encounters:  04/15/24 217 lb (98.4 kg)  10/06/23 214 lb 9.6 oz (97.3 kg)  04/04/23 211 lb 12.8 oz (96.1 kg)   Hyperlipidemia: Simvastatin  20 mg daily without new myalgias/se's. Not fasting - ate 1 hr ago.  Lab  Results  Component Value Date   CHOL 137 10/06/2023   HDL 42.20 10/06/2023   LDLCALC 59 10/06/2023   TRIG 177.0 (H) 10/06/2023   CHOLHDL 3 10/06/2023   Lab Results  Component Value Date   ALT 36 10/06/2023   AST 29 10/06/2023   ALKPHOS 56 10/06/2023   BILITOT 0.7 10/06/2023         04/15/2024    9:42 AM 10/06/2023   10:44 AM 12/26/2022    3:09 PM 10/03/2022    9:22 AM 04/01/2022    3:02 PM  Depression screen PHQ 2/9  Decreased Interest 0 0 0 0 0  Down, Depressed, Hopeless 0 0 0 0 0  PHQ - 2 Score 0 0 0 0 0  Altered sleeping 0 0 0    Tired, decreased energy 0 0 0    Change in appetite 0 0 0    Feeling bad or failure about yourself  0 0 0    Trouble concentrating 0 0 0    Moving slowly or fidgety/restless 0 0 0    Suicidal thoughts 0 0 0    PHQ-9 Score 0 0 0    Difficult doing work/chores Not difficult at all  Not difficult at all      Health Maintenance  Topic Date Due   Pneumococcal Vaccine: 50+ Years (1 of 1 - PCV) Never done   COVID-19 Vaccine (3 - Pfizer risk series) 11/27/2019   Medicare Annual Wellness (AWV)  12/26/2023   INFLUENZA VACCINE  04/02/2024   DTaP/Tdap/Td (2 - Td or Tdap) 05/09/2025   Colonoscopy  08/03/2031   Hepatitis C Screening  Completed   Zoster Vaccines- Shingrix  Completed   HPV VACCINES  Aged Out   Meningococcal B Vaccine  Aged Out  Colonoscopy 08/2021 - repeat 10 years. Internal hemorrhoids.  Prostate: followed  by urology as above   Immunization History  Administered Date(s) Administered   Fluad Quad(high Dose 65+) 06/01/2021   Influenza Inj Mdck Quad Pf 08/27/2017, 05/27/2018   Influenza Split 09/06/2015   Influenza, High Dose Seasonal PF 06/13/2020, 06/09/2023   Influenza-Unspecified 05/27/2018, 06/01/2021, 05/31/2022   PFIZER(Purple Top)SARS-COV-2 Vaccination 10/19/2019, 10/30/2019   Tdap 05/10/2015   Zoster Recombinant(Shingrix) 07/12/2022, 09/14/2022  Flu vaccine planned in fall.  Declines covid booster.  He thinks he has had  [facility]?  No results found. Optho - wears glasses - appt in past year.   Dental: every 6 months.   Alcohol: rare - 2-3 per month  Tobacco: none.   Exercise:as above - walking.    History Patient Active Problem List   Diagnosis Date Noted   Left rotator cuff tear 10/22/2018   Risk for coronary artery disease between 10% and 20% in next 10 years 11/27/2016   Prostate cancer (HCC) 09/14/2012   Abnormal gait 05/21/2012   HTN (hypertension) 02/03/2012   Dyslipidemia 02/03/2012   Peripheral neuropathy 02/03/2012   Past Medical History:  Diagnosis Date   Arthritis    generalized   GERD (gastroesophageal reflux disease)    hx of   Hyperlipidemia    on meds   Hypertension    on meds   Neuromuscular disorder (HCC)    Peripheral neuropathy    RIGHT sided foot drop   Prostate cancer (HCC)    [id number] adenocarcinoma of the prostate   Seasonal allergies    Past Surgical History:  Procedure Laterality Date   CHOLECYSTECTOMY N/A 03/16/2015   Procedure: LAPAROSCOPIC CHOLECYSTECTOMY WITH INTRAOPERATIVE CHOLANGIOGRAM;  Surgeon: Donnice Lunger, MD;  Location: [facility];  Service: General;  Laterality: N/A;   COLONOSCOPY  2012   JP-movi(exc)   FOOT SURGERY Right 1974   stepped on piece of glass- removed glass particles   JOINT REPLACEMENT  08/2009   Rt knee total replacement   REPLACEMENT TOTAL KNEE Right 2010   ROTATOR CUFF REPAIR Left 06/21/2015   No Known Allergies Prior to Admission medications   Medication Sig Start Date End Date Taking? Authorizing Provider  aspirin  EC 81 MG tablet Take 1 tablet (81 mg total) by mouth daily. 02/23/18  Yes Gretta Ozell CROME, PA-C  losartan  (COZAAR ) 25 MG tablet TAKE 1 TABLET (25 MG TOTAL) BY MOUTH DAILY. 11/24/23  Yes Levora Reyes SAUNDERS, MD  Multiple Vitamin (MULTIVITAMIN) tablet Take 1 tablet by mouth daily.   Yes [provider]  simvastatin  (ZOCOR ) 20 MG tablet TAKE 1 TABLET BY MOUTH EVERYDAY AT BEDTIME 11/24/23  Yes  Levora Reyes SAUNDERS, MD   Social History   Socioeconomic History   Marital status: Married    Spouse name: karen   Number of children: Not on file   Years of education: Not on file   Highest education level: Bachelor's degree (e.g., BA, AB, BS)  Occupational History   Occupation: retired  Tobacco Use   Smoking status: Former    Current packs/day: 0.00    Types: Cigars, Cigarettes    Quit date: 09/02/2008    Years since quitting: 15.6  Smokeless tobacco: Never   Tobacco comments:    a short and storied history with fine cigars ;-)  Vaping Use   Vaping status: Never Used  Substance and Sexual Activity   Alcohol use: Yes    Comment: rarely   Drug use: No   Sexual activity: Not Currently    Birth control/protection: Surgical  Other Topics Concern   Not on file  Social History Narrative   Lives with spouse   Right Handed   Drinks 4 cups caffeine   Social Drivers of Health   Financial Resource Strain: Low Risk  (04/12/2024)   Overall Financial Resource Strain (CARDIA)    Difficulty of Paying Living Expenses: Not hard at all  Food Insecurity: No Food Insecurity (04/12/2024)   Hunger Vital Sign    Worried About Running Out of Food in the Last Year: Never true    Ran Out of Food in the Last Year: Never true  Transportation Needs: No Transportation Needs (04/12/2024)   PRAPARE - Administrator, Civil Service (Medical): No    Lack of Transportation (Non-Medical): No  Physical Activity: Inactive (04/12/2024)   Exercise Vital Sign    Days of Exercise per Week: 0 days    Minutes of Exercise per Session: Not on file  Stress: No Stress Concern Present (04/12/2024)   Travis [facility] [employer] - Occupational Stress Questionnaire    Feeling of Stress: Not at all  Social Connections: Socially Integrated (04/12/2024)   Social Connection and Isolation Panel    Frequency of Communication with Friends and Family: More than three times a week    Frequency of Social  Gatherings with Friends and Family: Once a week    Attends Religious Services: More than 4 times per year    Active Member of [employer] or Organizations: Yes    Attends Banker Meetings: More than 4 times per year    Marital Status: Married  Catering manager Violence: Not At Risk (12/26/2022)   Humiliation, Afraid, Rape, and Kick questionnaire    Fear of Current or Ex-Partner: No    Emotionally Abused: No    Physically Abused: No    Sexually Abused: No    Review of Systems 13 point review of systems per patient health survey noted.  Negative other than as indicated above or in HPI.    Objective:   Vitals:   04/15/24 0931  BP: 118/70  Pulse: 84  SpO2: 96%  Weight: 217 lb (98.4 kg)     Physical Exam Vitals reviewed.  Constitutional:      Appearance: He is well-developed.  HENT:     Head: Normocephalic and atraumatic.     Right Ear: External ear normal.     Left Ear: External ear normal.  Eyes:     Conjunctiva/sclera: Conjunctivae normal.     Pupils: Pupils are equal, round, and reactive to light.  Neck:     Thyroid: No thyromegaly.  Cardiovascular:     Rate and Rhythm: Normal rate and regular rhythm.     Heart sounds: Normal heart sounds.  Pulmonary:     Effort: Pulmonary effort is normal. No respiratory distress.     Breath sounds: Normal breath sounds. No wheezing.  Abdominal:     General: There is no distension.     Palpations: Abdomen is soft.     Tenderness: There is no abdominal tenderness.  Musculoskeletal:        General: No tenderness. Normal range of motion.  Cervical back: Normal range of motion and neck supple.  Lymphadenopathy:     Cervical: No cervical adenopathy.  Skin:    General: Skin is warm and dry.  Neurological:     Mental Status: He is alert and oriented to person, place, and time.     Deep Tendon Reflexes: Reflexes are normal and symmetric.  Psychiatric:        Behavior: Behavior normal.        Assessment & Plan:   Travis Ellis is a 68 y.o. male . Annual physical exam  - -anticipatory guidance as below in AVS, screening labs above. Health maintenance items as above in HPI discussed/recommended as applicable.   - Condolences given on the loss of his mother.  Denies any need for additional support at this time but advised to let me know if I can be of any assistance during this difficult time.  Dyslipidemia - Plan: simvastatin  (ZOCOR ) 20 MG tablet, Comprehensive metabolic panel with GFR, Lipid panel  - Tolerating current dose of Zocor , check labs and adjust plan accordingly.  Prediabetes - Plan: Hemoglobin A1c  - Continue to watch diet, exercise somewhat limited by his neuropathy continue to monitor, check A1c and adjust plan accordingly.  Hyperlipidemia, unspecified hyperlipidemia type - Plan: Lipid panel As above  Hypertension, well controlled Stable controlled with losartan , continue same dose for now.  If lower readings at home or symptomatic lows, can try off meds but he will advise me if this occurs.  No changes for now.  Abnormal gait Other polyneuropathy  - Chronic condition as above, has walker if needed, currently using cane and ASO brace.  Fall precautions.  Meds ordered this encounter  Medications   losartan  (COZAAR ) 25 MG tablet    Sig: Take 1 tablet (25 mg total) by mouth daily.    Dispense:  90 tablet    Refill:  3   simvastatin  (ZOCOR ) 20 MG tablet    Sig: TAKE 1 TABLET BY MOUTH EVERYDAY AT BEDTIME    Dispense:  90 tablet    Refill:  3   Patient Instructions  Keep a record of your blood pressures outside of the office and if running lower, or lightheaded or dizzy - let me know and we can adjust your meds.  [facility], if they have not given you the pneumonia vaccine - it can be given here or your pharmacy.   Thank you for coming in today. No change in medications at this time. If there are any concerns on your bloodwork, I will let you know.  I am sorry to hear of  your loss. Please let me know if I can help during this time. Take care.   Preventive Care 82 Years and Older, Male Preventive care refers to lifestyle choices and visits with your health care provider that can promote health and wellness. Preventive care visits are also called wellness exams. What can I expect for my preventive care visit? Counseling During your preventive care visit, your health care provider may ask about your: Medical history, including: Past medical problems. Family medical history. History of falls. Current health, including: Emotional well-being. Home life and relationship well-being. Sexual activity. Memory and ability to understand (cognition). Lifestyle, including: Alcohol, nicotine or tobacco, and drug use. Access to firearms. Diet, exercise, and sleep habits. Work and work Astronomer. Sunscreen use. Safety issues such as seatbelt and bike helmet use. Physical exam Your health care provider will check your: Height and weight. These may  be used to calculate your BMI (body mass index). BMI is a measurement that tells if you are at a healthy weight. Waist circumference. This measures the distance around your waistline. This measurement also tells if you are at a healthy weight and may help predict your risk of certain diseases, such as type 2 diabetes and high blood pressure. Heart rate and blood pressure. Body temperature. Skin for abnormal spots. What immunizations do I need?  Vaccines are usually given at various ages, according to a schedule. Your health care provider will recommend vaccines for you based on your age, medical history, and lifestyle or other factors, such as travel or where you work. What tests do I need? Screening Your health care provider may recommend screening tests for certain conditions. This may include: Lipid and cholesterol levels. Diabetes screening. This is done by checking your blood sugar (glucose) after you have not  eaten for a while (fasting). Hepatitis C test. Hepatitis B test. HIV (human immunodeficiency virus) test. STI (sexually transmitted infection) testing, if you are at risk. Lung cancer screening. Colorectal cancer screening. Prostate cancer screening. Abdominal aortic aneurysm (AAA) screening. You may need this if you are a current or former smoker. Talk with your health care provider about your test results, treatment options, and if necessary, the need for more tests. Follow these instructions at home: Eating and drinking  Eat a diet that includes fresh fruits and vegetables, whole grains, lean protein, and low-fat dairy products. Limit your intake of foods with high amounts of sugar, saturated fats, and salt. Take vitamin and mineral supplements as recommended by your health care provider. Do not drink alcohol if your health care provider tells you not to drink. If you drink alcohol: Limit how much you have to 0-2 drinks a day. Know how much alcohol is in your drink. In the [place] one drink equals one 12 oz bottle of beer (355 mL), one 5 oz glass of wine (148 mL), or one 1 oz glass of hard liquor (44 mL). Lifestyle Brush your teeth every morning and night with fluoride toothpaste. Floss one time each day. Exercise for at least 30 minutes 5 or more days each week. Do not use any products that contain nicotine or tobacco. These products include cigarettes, chewing tobacco, and vaping devices, such as e-cigarettes. If you need help quitting, ask your health care provider. Do not use drugs. If you are sexually active, practice safe sex. Use a condom or other form of protection to prevent STIs. Take aspirin  only as told by your health care provider. Make sure that you understand how much to take and what form to take. Work with your health care provider to find out whether it is safe and beneficial for you to take aspirin  daily. Ask your health care provider if you need to take a  cholesterol-lowering medicine (statin). Find healthy ways to manage stress, such as: Meditation, yoga, or listening to music. Journaling. Talking to a trusted person. Spending time with friends and family. Safety Always wear your seat belt while driving or riding in a vehicle. Do not drive: If you have been drinking alcohol. Do not ride with someone who has been drinking. When you are tired or distracted. While texting. If you have been using any mind-altering substances or drugs. Wear a helmet and other protective equipment during sports activities. If you have firearms in your house, make sure you follow all gun safety procedures. Minimize exposure to UV radiation to reduce your risk of  skin cancer. What's next? Visit your health care provider once a year for an annual wellness visit. Ask your health care provider how often you should have your eyes and teeth checked. Stay up to date on all vaccines. This information is not intended to replace advice given to you by your health care provider. Make sure you discuss any questions you have with your health care provider. Document Revised: 02/14/2021 Document Reviewed: 02/14/2021 Elsevier Patient Education  2024 Elsevier Inc.     Signed,   Reyes Pines, MD [facility], [facility] 04/15/24 10:23 AM

## 2024-04-18 ENCOUNTER — Ambulatory Visit: Payer: Self-pay | Admitting: Family Medicine

## 2024-07-19 ENCOUNTER — Other Ambulatory Visit: Payer: Self-pay | Admitting: Family Medicine

## 2024-07-19 ENCOUNTER — Other Ambulatory Visit (INDEPENDENT_AMBULATORY_CARE_PROVIDER_SITE_OTHER)

## 2024-07-19 DIAGNOSIS — R7303 Prediabetes: Secondary | ICD-10-CM

## 2024-07-19 NOTE — Progress Notes (Signed)
 Lab only visit

## 2024-07-20 LAB — HEMOGLOBIN A1C: Hgb A1c MFr Bld: 6.2 % (ref 4.6–6.5)

## 2024-07-25 ENCOUNTER — Ambulatory Visit: Payer: Self-pay | Admitting: Family Medicine

## 2024-07-28 ENCOUNTER — Ambulatory Visit

## 2024-08-12 ENCOUNTER — Ambulatory Visit (INDEPENDENT_AMBULATORY_CARE_PROVIDER_SITE_OTHER)

## 2024-08-12 VITALS — BP 118/70 | Ht 72.0 in | Wt 214.0 lb

## 2024-08-12 DIAGNOSIS — Z0000 Encounter for general adult medical examination without abnormal findings: Secondary | ICD-10-CM | POA: Diagnosis not present

## 2024-08-12 NOTE — Patient Instructions (Signed)
 Travis Ellis,  Thank you for taking the time for your Medicare Wellness Visit. I appreciate your continued commitment to your health goals. Please review the care plan we discussed, and feel free to reach out if I can assist you further.  Please note that Annual Wellness Visits do not include a physical exam. Some assessments may be limited, especially if the visit was conducted virtually. If needed, we may recommend an in-person follow-up with your provider.  Ongoing Care Seeing your primary care provider every 3 to 6 months helps us  monitor your health and provide consistent, personalized care.   Referrals If a referral was made during today's visit and you haven't received any updates within two weeks, please contact the referred provider directly to check on the status.  Recommended Screenings:  Health Maintenance  Topic Date Due   COVID-19 Vaccine (3 - Pfizer risk series) 11/27/2019   Medicare Annual Wellness Visit  12/26/2023   DTaP/Tdap/Td vaccine (2 - Td or Tdap) 05/09/2025   Colon Cancer Screening  08/03/2031   Pneumococcal Vaccine for age over 55  Completed   Flu Shot  Completed   Hepatitis C Screening  Completed   Zoster (Shingles) Vaccine  Completed   Meningitis B Vaccine  Aged Out       12/26/2022    3:04 PM  Advanced Directives  Does Patient Have a Medical Advance Directive? No  Would patient like information on creating a medical advance directive? No - Patient declined    Vision: Annual vision screenings are recommended for early detection of glaucoma, cataracts, and diabetic retinopathy. These exams can also reveal signs of chronic conditions such as diabetes and high blood pressure.  Dental: Annual dental screenings help detect early signs of oral cancer, gum disease, and other conditions linked to overall health, including heart disease and diabetes.  Please see the attached documents for additional preventive care recommendations.

## 2024-08-12 NOTE — Progress Notes (Signed)
 I connected with  Travis Ellis on 08/12/2024 by a audio enabled telemedicine application and verified that I am speaking with the correct person using two identifiers.  Patient Location: Home  Provider Location: Home Office  Persons Participating in Visit: Patient.  I discussed the limitations of evaluation and management by telemedicine. The patient expressed understanding and agreed to proceed.  Vital Signs: Because this visit was a virtual/telehealth visit, some criteria may be missing or patient reported. Any vitals not documented were not able to be obtained and vitals that have been documented are patient reported.  Chief Complaint  Patient presents with   Medicare Wellness     Subjective:   Travis Ellis is a 68 y.o. male who presents for a Medicare Annual Wellness Visit.  Visit info / Clinical Intake: Medicare Wellness Visit Type:: Subsequent Annual Wellness Visit Persons participating in visit and providing information:: patient Medicare Wellness Visit Mode:: Telephone If telephone:: video declined Since this visit was completed virtually, some vitals may be partially provided or unavailable. Missing vitals are due to the limitations of the virtual format.: Documented vitals are patient reported If Telephone or Video please confirm:: I connected with patient using audio/video enable telemedicine. I verified patient identity with two identifiers, discussed telehealth limitations, and patient agreed to proceed. Patient Location:: home Provider Location:: home office Interpreter Needed?: No Pre-visit prep was completed: yes AWV questionnaire completed by patient prior to visit?: yes Date:: 07/24/24 Living arrangements:: lives with spouse/significant other Patient's Overall Health Status Rating: very good Typical amount of pain: none Does pain affect daily life?: no Are you currently prescribed opioids?: no  Dietary Habits and Nutritional Risks How many meals  a day?: 3 Eats fruit and vegetables daily?: yes Most meals are obtained by: eating out; having others provide food In the last 2 weeks, have you had any of the following?: none Diabetic:: no  Functional Status Activities of Daily Living (to include ambulation/medication): (Patient-Rptd) Independent Ambulation: Independent with device- listed below Home Assistive Devices/Equipment: Cane Medication Administration: Independent Home Management (perform basic housework or laundry): (Patient-Rptd) Independent Manage your own finances?: yes Primary transportation is: driving Concerns about vision?: no *vision screening is required for WTM* Concerns about hearing?: no  Fall Screening Falls in the past year?: (Patient-Rptd) 1 Number of falls in past year: (Patient-Rptd) 0 Was there an injury with Fall?: (Patient-Rptd) 0 Fall Risk Category Calculator: (Patient-Rptd) 1 Patient Fall Risk Level: (Patient-Rptd) Low Fall Risk  Fall Risk Patient at Risk for Falls Due to: History of fall(s); Impaired mobility Fall risk Follow up: Falls evaluation completed; Education provided; Falls prevention discussed  Home and Transportation Safety: All rugs have non-skid backing?: yes All stairs or steps have railings?: yes Grab bars in the bathtub or shower?: yes Have non-skid surface in bathtub or shower?: yes Good home lighting?: yes Regular seat belt use?: yes Hospital stays in the last year:: no  Cognitive Assessment Difficulty concentrating, remembering, or making decisions? : no Will 6CIT or Mini Cog be Completed: yes What year is it?: 0 points What month is it?: 0 points Give patient an address phrase to remember (5 components): remember ,apple , table penny About what time is it?: 0 points Count backwards from 20 to 1: 0 points Say the months of the year in reverse: 0 points Repeat the address phrase from earlier: 0 points 6 CIT Score: 0 points  Advance Directives (For Healthcare) Does  Patient Have a Medical Advance Directive?: Yes Does patient want  to make changes to medical advance directive?: No - Patient declined Type of Advance Directive: Healthcare Power of [place]; Living will Copy of Healthcare Power of Attorney in Chart?: No - copy requested Copy of Living Will in Chart?: No - copy requested  Reviewed/Updated  Reviewed/Updated: Reviewed All (Medical, Surgical, Family, Medications, Allergies, Care Teams, Patient Goals)    Allergies (verified) Patient has no known allergies.   Current Medications (verified) Outpatient Encounter Medications as of 08/12/2024  Medication Sig   aspirin  EC 81 MG tablet Take 1 tablet (81 mg total) by mouth daily.   losartan  (COZAAR ) 25 MG tablet Take 1 tablet (25 mg total) by mouth daily.   Multiple Vitamin (MULTIVITAMIN) tablet Take 1 tablet by mouth daily.   simvastatin  (ZOCOR ) 20 MG tablet TAKE 1 TABLET BY MOUTH EVERYDAY AT BEDTIME   Facility-Administered Encounter Medications as of 08/12/2024  Medication   0.9 %  sodium chloride  infusion    History: Past Medical History:  Diagnosis Date   Arthritis    generalized   GERD (gastroesophageal reflux disease)    hx of   Hyperlipidemia    on meds   Hypertension    on meds   Neuromuscular disorder (HCC)    Peripheral neuropathy    RIGHT sided foot drop   Prostate cancer (HCC)    [id number] adenocarcinoma of the prostate   Seasonal allergies    Past Surgical History:  Procedure Laterality Date   CHOLECYSTECTOMY N/A 03/16/2015   Procedure: LAPAROSCOPIC CHOLECYSTECTOMY WITH INTRAOPERATIVE CHOLANGIOGRAM;  Surgeon: Donnice Lunger, MD;  Location: [facility];  Service: General;  Laterality: N/A;   COLONOSCOPY  2012   JP-movi(exc)   FOOT SURGERY Right 1974   stepped on piece of glass- removed glass particles   JOINT REPLACEMENT  08/2009   Rt knee total replacement   REPLACEMENT TOTAL KNEE Right 2010   ROTATOR CUFF REPAIR Left 06/21/2015   Family History  Problem Relation  Age of Onset   Breast cancer Mother    Cancer Mother        skin   Vision loss Mother    Heart disease Father        heart attack   COPD Father        smoker   Hypertension Father    Hearing loss Father    Early death Brother    Heart disease Brother    Heart disease Paternal Grandfather    Hyperlipidemia Paternal Grandfather    Stroke Paternal Grandfather    Colon polyps Neg Hx    Colon cancer Neg Hx    Esophageal cancer Neg Hx    Rectal cancer Neg Hx    Stomach cancer Neg Hx    Social History   Occupational History   Occupation: retired  Tobacco Use   Smoking status: Former    Current packs/day: 0.00    Types: Cigars, Cigarettes    Quit date: 09/02/2008    Years since quitting: 15.9   Smokeless tobacco: Never   Tobacco comments:    a short and storied history with fine cigars ;-)  Vaping Use   Vaping status: Never Used  Substance and Sexual Activity   Alcohol use: Yes    Comment: rarely   Drug use: No   Sexual activity: Not Currently    Birth control/protection: Surgical   Tobacco Counseling Counseling given: Not Answered Tobacco comments: a short and storied history with fine cigars ;-)  SDOH Screenings   Food Insecurity: No Food Insecurity (07/24/2024)  Housing: Low Risk (07/24/2024)  Transportation Needs: No Transportation Needs (07/24/2024)  Utilities: Not At Risk (08/12/2024)  Alcohol Screen: Low Risk (07/24/2024)  Depression (PHQ2-9): Low Risk (08/12/2024)  Financial Resource Strain: Low Risk (07/24/2024)  Physical Activity: Patient Declined (08/12/2024)  Social Connections: Socially Integrated (07/24/2024)  Stress: No Stress Concern Present (08/12/2024)  Tobacco Use: Medium Risk (08/12/2024)  Health Literacy: Adequate Health Literacy (08/12/2024)   See flowsheets for full screening details  Depression Screen PHQ 2 & 9 Depression Scale- Over the past 2 weeks, how often have you been bothered by any of the following problems? Little interest or  pleasure in doing things: 0 Feeling down, depressed, or hopeless (PHQ Adolescent also includes...irritable): 0 PHQ-2 Total Score: 0 Trouble falling or staying asleep, or sleeping too much: 0 Feeling tired or having little energy: 0 Poor appetite or overeating (PHQ Adolescent also includes...weight loss): 0 Feeling bad about yourself - or that you are a failure or have let yourself or your family down: 0 Trouble concentrating on things, such as reading the newspaper or watching television (PHQ Adolescent also includes...like school work): 0 Moving or speaking so slowly that other people could have noticed. Or the opposite - being so fidgety or restless that you have been moving around a lot more than usual: 0 Thoughts that you would be better off dead, or of hurting yourself in some way: 0 PHQ-9 Total Score: 0 If you checked off any problems, how difficult have these problems made it for you to do your work, take care of things at home, or get along with other people?: Not difficult at all  Depression Treatment Depression Interventions/Treatment : [id number] Score <4 Follow-up Not Indicated     Goals Addressed             This Visit's Progress    Patient Stated       To become a multi millionaire              Objective:    Today's Vitals   08/12/24 1520  BP: 118/70  Weight: 214 lb (97.1 kg)  Height: 6' (1.829 m)   Body mass index is 29.02 kg/m.  Hearing/Vision screen Hearing Screening - Comments:: No difficulties  Vision Screening - Comments:: Patient wears glasses. Patient sees Orthoptist Immunizations and Health Maintenance Health Maintenance  Topic Date Due   COVID-19 Vaccine (3 - Pfizer risk series) 11/27/2019   Medicare Annual Wellness (AWV)  12/26/2023   DTaP/Tdap/Td (2 - Td or Tdap) 05/09/2025   Colonoscopy  08/03/2031   Pneumococcal Vaccine: 50+ Years  Completed   Influenza Vaccine  Completed   Hepatitis C Screening  Completed   Zoster Vaccines-  Shingrix  Completed   Meningococcal B Vaccine  Aged Out        Assessment/Plan:  This is a routine wellness examination for Travis Ellis.  Patient Care Team: Levora Reyes SAUNDERS, MD as PCP - General (Family Medicine) Onita Duos, MD as Consulting Physician (Neurology) Renda Glance, MD as Consulting Physician (Urology)  I have personally reviewed and noted the following in the patients chart:   Medical and social history Use of alcohol, tobacco or illicit drugs  Current medications and supplements including opioid prescriptions. Functional ability and status Nutritional status Physical activity Advanced directives List of other physicians Hospitalizations, surgeries, and ER visits in previous 12 months Vitals Screenings to include cognitive, depression, and falls Referrals and appointments  No orders of the defined types were placed in this encounter.  In addition,  I have reviewed and discussed with patient certain preventive protocols, quality metrics, and best practice recommendations. A written personalized care plan for preventive services as well as general preventive health recommendations were provided to patient.   Lyle MARLA Right, [place]   08/12/2024   No follow-ups on file.  After Visit Summary: (MyChart) Due to this being a telephonic visit, the after visit summary with patients personalized plan was offered to patient via MyChart   Nurse Notes: patient declined covid vaccine. Upcoming appointment 10/15/2024

## 2024-10-15 ENCOUNTER — Ambulatory Visit: Admitting: Family Medicine
